# Patient Record
Sex: Female | Born: 1950 | Race: White | Hispanic: No | Marital: Married | Smoking: Never smoker
Health system: Southern US, Community
[De-identification: ages and names within clinical notes are randomized; demographics above are authoritative.]

## PROBLEM LIST (undated history)

## (undated) DIAGNOSIS — I456 Pre-excitation syndrome: Secondary | ICD-10-CM

## (undated) DIAGNOSIS — M7751 Other enthesopathy of right foot: Secondary | ICD-10-CM

## (undated) DIAGNOSIS — I471 Supraventricular tachycardia, unspecified: Secondary | ICD-10-CM

## (undated) DIAGNOSIS — M5030 Other cervical disc degeneration, unspecified cervical region: Secondary | ICD-10-CM

## (undated) DIAGNOSIS — Z9289 Personal history of other medical treatment: Secondary | ICD-10-CM

## (undated) DIAGNOSIS — B019 Varicella without complication: Secondary | ICD-10-CM

## (undated) DIAGNOSIS — I341 Nonrheumatic mitral (valve) prolapse: Secondary | ICD-10-CM

## (undated) DIAGNOSIS — N281 Cyst of kidney, acquired: Secondary | ICD-10-CM

## (undated) DIAGNOSIS — R002 Palpitations: Secondary | ICD-10-CM

## (undated) DIAGNOSIS — M8580 Other specified disorders of bone density and structure, unspecified site: Secondary | ICD-10-CM

## (undated) DIAGNOSIS — K219 Gastro-esophageal reflux disease without esophagitis: Secondary | ICD-10-CM

## (undated) DIAGNOSIS — M419 Scoliosis, unspecified: Secondary | ICD-10-CM

## (undated) HISTORY — DX: Other enthesopathy of right foot and ankle: M77.51

## (undated) HISTORY — DX: Supraventricular tachycardia, unspecified: I47.10

## (undated) HISTORY — DX: Other cervical disc degeneration, unspecified cervical region: M50.30

## (undated) HISTORY — DX: Nonrheumatic mitral (valve) prolapse: I34.1

## (undated) HISTORY — DX: Supraventricular tachycardia: I47.1

## (undated) HISTORY — DX: Pre-excitation syndrome: I45.6

## (undated) HISTORY — DX: Other specified disorders of bone density and structure, unspecified site: M85.80

## (undated) HISTORY — DX: Palpitations: R00.2

## (undated) HISTORY — DX: Gastro-esophageal reflux disease without esophagitis: K21.9

## (undated) HISTORY — DX: Cyst of kidney, acquired: N28.1

## (undated) HISTORY — DX: Varicella without complication: B01.9

## (undated) HISTORY — DX: Personal history of other medical treatment: Z92.89

## (undated) HISTORY — DX: Scoliosis, unspecified: M41.9

## (undated) LAB — CBC AND DIFFERENTIAL: WBC: 8.2 (ref 5.0–15.0)

## (undated) LAB — CBC: RBC: 4.76 (ref 3.87–5.11)

---

## 1987-11-09 HISTORY — PX: VAGINAL HYSTERECTOMY: SUR661

## 2000-02-08 ENCOUNTER — Other Ambulatory Visit: Admission: RE | Admit: 2000-02-08 | Discharge: 2000-02-08 | Payer: Self-pay | Admitting: Gynecology

## 2006-11-03 ENCOUNTER — Ambulatory Visit: Payer: Self-pay | Admitting: Internal Medicine

## 2008-07-12 ENCOUNTER — Emergency Department (HOSPITAL_COMMUNITY): Admission: EM | Admit: 2008-07-12 | Discharge: 2008-07-12 | Payer: Self-pay | Admitting: Emergency Medicine

## 2009-06-10 ENCOUNTER — Encounter: Admission: RE | Admit: 2009-06-10 | Discharge: 2009-06-10 | Payer: Self-pay | Admitting: Family Medicine

## 2010-11-30 ENCOUNTER — Encounter: Payer: Self-pay | Admitting: Family Medicine

## 2011-01-04 ENCOUNTER — Encounter (INDEPENDENT_AMBULATORY_CARE_PROVIDER_SITE_OTHER): Payer: Self-pay | Admitting: *Deleted

## 2011-01-04 ENCOUNTER — Encounter: Payer: Self-pay | Admitting: Internal Medicine

## 2011-01-07 LAB — HM COLONOSCOPY

## 2011-01-12 ENCOUNTER — Other Ambulatory Visit: Payer: Self-pay | Admitting: Internal Medicine

## 2011-01-12 ENCOUNTER — Other Ambulatory Visit (AMBULATORY_SURGERY_CENTER): Payer: BC Managed Care – PPO | Admitting: Internal Medicine

## 2011-01-12 DIAGNOSIS — D126 Benign neoplasm of colon, unspecified: Secondary | ICD-10-CM

## 2011-01-12 DIAGNOSIS — Z1211 Encounter for screening for malignant neoplasm of colon: Secondary | ICD-10-CM

## 2011-01-12 DIAGNOSIS — K5289 Other specified noninfective gastroenteritis and colitis: Secondary | ICD-10-CM

## 2011-01-12 HISTORY — PX: COLONOSCOPY: SHX174

## 2011-01-14 NOTE — Letter (Signed)
Summary: [facility] Instructions  [facility]  [address] [place], [place] [address]   Phone: [phone]  Fax: [phone]       Jenny Hicks    Mar 30, 1951    MRN: [id number]        Procedure Day /Date:  Tuesday 01/12/2011     Arrival Time: 8:30 am     Procedure Time: 9:30 am     Location of Procedure:                    _x _  [facility] (4th Floor)                        PREPARATION FOR COLONOSCOPY WITH MOVIPREP   Starting 5 days prior to your procedure Thursday 3/1 do not eat nuts, seeds, popcorn, corn, beans, peas,  salads, or any raw vegetables.  Do not take any fiber supplements (e.g. Metamucil, Citrucel, and Benefiber).  THE DAY BEFORE YOUR PROCEDURE         DATE: Monday 3/5  1.  Drink clear liquids the entire day-NO SOLID FOOD  2.  Do not drink anything colored red or purple.  Avoid juices with pulp.  No orange juice.  3.  Drink at least 64 oz. (8 glasses) of fluid/clear liquids during the day to prevent dehydration and help the prep work efficiently.  CLEAR LIQUIDS INCLUDE: Water Jello Ice Popsicles Tea (sugar ok, no milk/cream) Powdered fruit flavored drinks Coffee (sugar ok, no milk/cream) Gatorade Juice: apple, white grape, white cranberry  Lemonade Clear bullion, consomm, broth Carbonated beverages (any kind) Strained chicken noodle soup Hard Candy                             4.  In the morning, mix first dose of MoviPrep solution:    Empty 1 Pouch A and 1 Pouch B into the disposable container    Add lukewarm drinking water to the top line of the container. Mix to dissolve    Refrigerate (mixed solution should be used within 24 hrs)  5.  Begin drinking the prep at 5:00 p.m. The MoviPrep container is divided by 4 marks.   Every 15 minutes drink the solution down to the next mark (approximately 8 oz) until the full liter is complete.   6.  Follow completed prep with 16 oz of clear liquid of your choice (Nothing red  or purple).  Continue to drink clear liquids until bedtime.  7.  Before going to bed, mix second dose of MoviPrep solution:    Empty 1 Pouch A and 1 Pouch B into the disposable container    Add lukewarm drinking water to the top line of the container. Mix to dissolve    Refrigerate  THE DAY OF YOUR PROCEDURE      DATE: Tuesday 3/6  Beginning at 4:30 a.m. (5 hours before procedure):         1. Every 15 minutes, drink the solution down to the next mark (approx 8 oz) until the full liter is complete.  2. Follow completed prep with 16 oz. of clear liquid of your choice.    3. You may drink clear liquids until 7:30 am (2 HOURS BEFORE PROCEDURE).   MEDICATION INSTRUCTIONS  Unless otherwise instructed, you should take regular prescription medications with a small sip of water   as early as possible the morning of your  procedure.           OTHER INSTRUCTIONS  You will need a responsible adult at least 60 years of age to accompany you and drive you home.   This person must remain in the waiting room during your procedure.  Wear loose fitting clothing that is easily removed.  Leave jewelry and other valuables at home.  However, you may wish to bring a book to read or  an iPod/MP3 player to listen to music as you wait for your procedure to start.  Remove all body piercing jewelry and leave at home.  Total time from sign-in until discharge is approximately 2-3 hours.  You should go home directly after your procedure and rest.  You can resume normal activities the  day after your procedure.  The day of your procedure you should not:   Drive   Make legal decisions   Operate machinery   Drink alcohol   Return to work  You will receive specific instructions about eating, activities and medications before you leave.    The above instructions have been reviewed and explained to me by  Ezra Sites RN  January 04, 2011 11:37 AM     I fully understand and can  verbalize these instructions _____________________________ Date _________

## 2011-01-14 NOTE — Miscellaneous (Signed)
Summary: [facility]  Clinical Lists Changes  Medications: Added new medication of MOVIPREP 100 GM  SOLR (PEG-KCL-NACL-NASULF-NA ASC-C) As per prep instructions. - Signed Rx of MOVIPREP 100 GM  SOLR (PEG-KCL-NACL-NASULF-NA ASC-C) As per prep instructions.;  #1 x 0;  Signed;  Entered by: Ezra Sites RN;  Authorized by: Hart Carwin MD;  Method used: Electronically to [facility] [address] [place], [place], [place]  [address], Ph: [phone], Fax: [phone] Observations: Added new observation of NKA: T (01/04/2011 11:08)    Prescriptions: MOVIPREP 100 GM  SOLR (PEG-KCL-NACL-NASULF-NA ASC-C) As per prep instructions.  #1 x 0   Entered by:   Ezra Sites RN   Authorized by:   Hart Carwin MD   Signed by:   Ezra Sites RN on 01/04/2011   Method used:   Electronically to        [facility] (retail)       [address]       [place], [place]  [address]       Ph: [phone]       Fax: [phone]   RxID:   [id number]

## 2011-01-18 ENCOUNTER — Encounter: Payer: Self-pay | Admitting: Internal Medicine

## 2011-01-19 NOTE — Procedures (Addendum)
Summary: Colonoscopy  Patient: [name] Note: All result statuses are Final unless otherwise noted.  Tests: (1) Colonoscopy (COL)   COL Colonoscopy           DONE     [facility]     [address]     [place], [place]  [address]          COLONOSCOPY PROCEDURE REPORT          PATIENT:  [name], Jenny Hicks  MR#:  [id number]     BIRTHDATE:  18-Apr-1951, 59 yrs. old  GENDER:  female     ENDOSCOPIST:  Hedwig Morton. Juanda Chance, MD     REF. BY:  Nolon Nations, M.D.     PROCEDURE DATE:  01/12/2011     PROCEDURE:  Colonoscopy 19[address]     ASA CLASS:  Class I     INDICATIONS:  Routine Risk Screening     MEDICATIONS:   Versed 7 mg, Fentanyl 75 mcg          DESCRIPTION OF PROCEDURE:   After the risks benefits and     alternatives of the procedure were thoroughly explained, informed     consent was obtained.  Digital rectal exam was performed and     revealed no rectal masses.   The LB [id number]-[id number] endoscope     was introduced through the anus and advanced to the cecum, which     was identified by both the appendix and ileocecal valve, without     limitations.  The quality of the prep was good, using MiraLax.     The instrument was then slowly withdrawn as the colon was fully     examined.     <<PROCEDUREIMAGES>>          FINDINGS:  A pedunculated polyp was found. 1 cm friable polyp at     55 cm Polyp was snared, then cauterized with monopolar cautery.     Retrieval was successful (see image4, image5, and image6). snare     polyp  This was otherwise a normal examination of the colon (see     image7, image3, image2, and image1).   Retroflexed views in the     rectum revealed no abnormalities.    The scope was then withdrawn     from the patient and the procedure completed.          COMPLICATIONS:  None     ENDOSCOPIC IMPRESSION:     1) Pedunculated polyp     2) Otherwise normal examination     s/p snare polypectomy     RECOMMENDATIONS:     1) Await pathology results     2)  High fiber diet.     REPEAT EXAM:  In 5 year(s) for.          ______________________________     Hedwig Morton. Juanda Chance, MD          CC:          n.     eSIGNED:   Hedwig Morton. Revin Corker at 01/12/2011 10:17 AM          Irene Pap, [id number]  Note: An exclamation mark (!) indicates a result that was not dispersed into the flowsheet. Document Creation Date: 01/12/2011 10:18 AM _______________________________________________________________________  (1) Order result status: Final Collection or observation date-time: 01/12/2011 10:09 Requested date-time:  Receipt date-time:  Reported date-time:  Referring Physician:   Ordering Physician: Lina Sar [phone]) Specimen Source:  Source: Launa Grill Order  Number: [id number] Lab site:   Appended Document: Colonoscopy     Procedures Next Due Date:    Colonoscopy: 01/2021

## 2011-01-26 NOTE — Letter (Signed)
Summary: Patient Notice- Polyp Results  [facility]  [address] [place], [place] [address]   Phone: [phone]  Fax: [phone]        January 18, 2011 MRN: [id number]    Jenny Hicks [address] [place], [place]  [address]    Dear Ms. Velazco,  I am pleased to inform you that the colon polyp(s) removed during your recent colonoscopy was (were) found to be benign (no cancer detected) upon pathologic examination.Thepolyp consisted of inflamed tissue ( not premalignant)  I recommend you have a repeat colonoscopy examination in 10 _ years to look for recurrent polyps, as having colon polyps increases your risk for having recurrent polyps or even colon cancer in the future.  Should you develop new or worsening symptoms of abdominal pain, bowel habit changes or bleeding from the rectum or bowels, please schedule an evaluation with either your primary care physician or with me.  Additional information/recommendations:  _x_ No further action with gastroenterology is needed at this time. Please      follow-up with your primary care physician for your other healthcare      needs.  __ Please call [phone] to schedule a return visit to review your      situation.  __ Please keep your follow-up visit as already scheduled.  __ Continue treatment plan as outlined the day of your exam.  Please call us if you are having persistent problems or have questions about your condition that have not been fully answered at this time.  Sincerely,  Hart Carwin MD  This letter has been electronically signed by your physician.  Appended Document: Patient Notice- Polyp Results letter mailed

## 2011-08-11 LAB — POCT I-STAT, CHEM 8
Glucose, Bld: 107 — ABNORMAL HIGH
HCT: 41
Hemoglobin: 13.9
Potassium: 3.5
Sodium: 140
TCO2: 28

## 2011-08-11 LAB — COMPREHENSIVE METABOLIC PANEL
ALT: 22
AST: 23
Calcium: 9
Creatinine, Ser: 0.73
GFR calc Af Amer: >60
Sodium: 142
Total Protein: 6.2

## 2011-08-11 LAB — POCT CARDIAC MARKERS
CKMB, poc: <1 — ABNORMAL LOW
CKMB, poc: <1 — ABNORMAL LOW
Myoglobin, poc: 36.8
Troponin i, poc: <0.05

## 2011-08-11 LAB — APTT: aPTT: 29

## 2013-03-08 DIAGNOSIS — Z9289 Personal history of other medical treatment: Secondary | ICD-10-CM

## 2013-03-08 HISTORY — DX: Personal history of other medical treatment: Z92.89

## 2013-03-08 HISTORY — PX: TRANSTHORACIC ECHOCARDIOGRAM: SHX275

## 2013-03-13 ENCOUNTER — Other Ambulatory Visit (HOSPITAL_COMMUNITY): Payer: Self-pay | Admitting: Cardiovascular Disease

## 2013-03-13 DIAGNOSIS — I341 Nonrheumatic mitral (valve) prolapse: Secondary | ICD-10-CM

## 2013-03-13 DIAGNOSIS — R079 Chest pain, unspecified: Secondary | ICD-10-CM

## 2013-03-23 ENCOUNTER — Ambulatory Visit (HOSPITAL_COMMUNITY)
Admission: RE | Admit: 2013-03-23 | Discharge: 2013-03-23 | Disposition: A | Discharge status: Home / Self Care | Payer: BC Managed Care – PPO | Source: Ambulatory Visit | Attending: Cardiovascular Disease | Admitting: Cardiovascular Disease

## 2013-03-23 DIAGNOSIS — R079 Chest pain, unspecified: Secondary | ICD-10-CM | POA: Insufficient documentation

## 2013-03-23 DIAGNOSIS — R002 Palpitations: Secondary | ICD-10-CM | POA: Insufficient documentation

## 2013-03-23 DIAGNOSIS — I341 Nonrheumatic mitral (valve) prolapse: Secondary | ICD-10-CM

## 2013-03-23 DIAGNOSIS — I517 Cardiomegaly: Secondary | ICD-10-CM | POA: Insufficient documentation

## 2013-03-23 DIAGNOSIS — R0609 Other forms of dyspnea: Secondary | ICD-10-CM | POA: Insufficient documentation

## 2013-03-23 DIAGNOSIS — E663 Overweight: Secondary | ICD-10-CM | POA: Insufficient documentation

## 2013-03-23 DIAGNOSIS — I079 Rheumatic tricuspid valve disease, unspecified: Secondary | ICD-10-CM | POA: Insufficient documentation

## 2013-03-23 DIAGNOSIS — R42 Dizziness and giddiness: Secondary | ICD-10-CM | POA: Insufficient documentation

## 2013-03-23 DIAGNOSIS — R0602 Shortness of breath: Secondary | ICD-10-CM | POA: Insufficient documentation

## 2013-03-23 DIAGNOSIS — Z8249 Family history of ischemic heart disease and other diseases of the circulatory system: Secondary | ICD-10-CM | POA: Insufficient documentation

## 2013-03-23 DIAGNOSIS — R0989 Other specified symptoms and signs involving the circulatory and respiratory systems: Secondary | ICD-10-CM | POA: Insufficient documentation

## 2013-03-23 DIAGNOSIS — I059 Rheumatic mitral valve disease, unspecified: Secondary | ICD-10-CM | POA: Insufficient documentation

## 2013-03-23 MED ORDER — TECHNETIUM TC 99M SESTAMIBI GENERIC - CARDIOLITE
10.4000 | Freq: Once | INTRAVENOUS | Status: AC | PRN
  Administered 2013-03-23: 10 via INTRAVENOUS

## 2013-03-23 MED ORDER — TECHNETIUM TC 99M SESTAMIBI GENERIC - CARDIOLITE
30.2000 | Freq: Once | INTRAVENOUS | Status: AC | PRN
  Administered 2013-03-23: 30.2 via INTRAVENOUS

## 2013-03-23 NOTE — Progress Notes (Signed)
[facility]   2D echo completed 03/23/2013.   Cindy Mariany Mackintosh, RDCS  

## 2013-03-23 NOTE — Procedures (Addendum)
[facility] [address] [place] [address] [phone]  Cardiology Nuclear Med Study  Jenny Hicks is a 62 y.o. female     MRN : [id number]     DOB: Jul 10, 1951  Procedure Date: 03/23/2013  Nuclear Med Background Indication for Stress Test:  Evaluation for Ischemia History:  MVP Cardiac Risk Factors: Family History - CAD, History of Smoking and Overweight  Symptoms:  Chest Pain, Dizziness, DOE, Fatigue, Light-Headedness, Palpitations and SOB   Nuclear Pre-Procedure Caffeine/Decaff Intake:  10:00pm NPO After: 8:00am   IV Site: R Antecubital  IV 0.9% NS with Angio Cath:  22g  Chest Size (in):  n/a IV Started by: Jenny Pomfret, RN  Height: 5\' 4"  (1.626 m)  Cup Size: DD  BMI:  Body mass index is 26.94 kg/(m^2). Weight:  157 lb (71.215 kg)   Tech Comments:  N/A    Nuclear Med Study 1 or 2 day study: 1 day  Stress Test Type:  Stress  Order Authorizing Provider:  Susa Griffins, MD   Resting Radionuclide: Technetium 36m Sestamibi  Resting Radionuclide Dose: 10.4 mCi   Stress Radionuclide:  Technetium 36m Sestamibi  Stress Radionuclide Dose: 30.2 mCi           Stress Protocol Rest HR: 81 Stress HR: 222  Rest BP: 128/93 Stress BP: 165/113  Exercise Time (min): 9:00 METS: 10.1   Predicted Max HR: 159 bpm % Max HR: 139.62 bpm Rate Pressure Product: 86578  Dose of Adenosine (mg):  n/a Dose of Lexiscan: n/a mg  Dose of Atropine (mg): n/a Dose of Dobutamine: n/a mcg/kg/min (at max HR)  Stress Test Technologist: Jenny Hicks, CCT Nuclear Technologist: Jenny Hicks, CNMT   Rest Procedure:  Myocardial perfusion imaging was performed at rest 45 minutes following the intravenous administration of Technetium 25m Sestamibi. Stress Procedure:  The patient performed treadmill exercise using a Bruce  Protocol for 9:00 minutes. The patient stopped due to SOB and denied any chest pain.  There were no significant ST-T wave  changes.  Technetium 56m Sestamibi was injected at peak exercise and myocardial perfusion imaging was performed after a brief delay.  Transient Ischemic Dilatation (Normal <1.22):  0.40 Lung/Heart Ratio (Normal <0.45):  0.30 QGS EDV:  51 ml QGS ESV:  9 ml LV Ejection Fraction: 82%  Signed by    Rest ECG: NSR - Normal EKG  Stress ECG: Pt went into an SVT at a rate of 200 with 1mm of ST depression.  QPS Raw Data Images:  Normal; no motion artifact; normal heart/lung ratio. Stress Images:  Normal homogeneous uptake in all areas of the myocardium. Rest Images:  Normal homogeneous uptake in all areas of the myocardium. Subtraction (SDS):  No evidence of ischemia.  Impression Exercise Capacity:  Good exercise capacity. BP Response:  Normal blood pressure response. Clinical Symptoms:  No significant symptoms noted. ECG Impression:  + for ischemia during the episode of SVT Comparison with Prior Nuclear Study: No previous nuclear study performed  Overall Impression:  Normal stress nuclear study.  LV Wall Motion:  NL LV Function; NL Wall Motion   Jenny Gess, MD  03/23/2013 3:09 PM

## 2013-03-26 ENCOUNTER — Other Ambulatory Visit: Payer: Self-pay | Admitting: Cardiovascular Disease

## 2013-03-26 LAB — COMPREHENSIVE METABOLIC PANEL
ALT: 16 U/L (ref 0–35)
BUN: 18 mg/dL (ref 6–23)
CO2: 31 mEq/L (ref 19–32)
Calcium: 9.7 mg/dL (ref 8.4–10.5)
Chloride: 102 mEq/L (ref 96–112)
Creat: 0.69 mg/dL (ref 0.50–1.10)
Total Bilirubin: 0.6 mg/dL (ref 0.3–1.2)

## 2013-03-27 ENCOUNTER — Encounter: Payer: Self-pay | Admitting: Cardiovascular Disease

## 2013-08-21 ENCOUNTER — Encounter: Payer: Self-pay | Admitting: *Deleted

## 2013-08-23 ENCOUNTER — Encounter: Payer: Self-pay | Admitting: Internal Medicine

## 2013-08-23 ENCOUNTER — Ambulatory Visit (INDEPENDENT_AMBULATORY_CARE_PROVIDER_SITE_OTHER): Payer: BC Managed Care – PPO | Admitting: Internal Medicine

## 2013-08-23 VITALS — BP 124/84 | HR 67 | Ht 64.0 in | Wt 156.6 lb

## 2013-08-23 DIAGNOSIS — I456 Pre-excitation syndrome: Secondary | ICD-10-CM | POA: Insufficient documentation

## 2013-08-23 DIAGNOSIS — I341 Nonrheumatic mitral (valve) prolapse: Secondary | ICD-10-CM

## 2013-08-23 DIAGNOSIS — I340 Nonrheumatic mitral (valve) insufficiency: Secondary | ICD-10-CM

## 2013-08-23 DIAGNOSIS — I059 Rheumatic mitral valve disease, unspecified: Secondary | ICD-10-CM

## 2013-08-23 NOTE — Patient Instructions (Signed)
For you allergies - try Zyrtec OTC (generic is cetirizine) - [facility] or [facility] has these available for purchase at reasonable prices.   Your physician wants you to follow-up in: 1 year. You will receive a reminder letter in the mail two months in advance. If you don't receive a letter, please call our office to schedule the follow-up appointment.

## 2013-08-23 NOTE — Progress Notes (Signed)
OFFICE NOTE  Chief Complaint:  Feels well, no complaints  Primary Care Physician: Garth Schlatter, MD  HPI:  Jenny Hicks pleasant 62 year old female with a history of palpitations in the past. She was noted at one point to have a short PR interval on her EKG. She underwent an echocardiogram in May of 2014 which showed normal systolic function EF of 55-60%. She also has mild mitral valve prolapse and mild to moderate mitral regurgitation.  She has had a stress test on 03/23/2013. During that stress test which was a Bruce treadmill stress test she went into an SVT with a heart rate of 200, and an associated 1 mm of ST depression. She received 6 mg of adenosine and carotid sinus massage which broke the arrhythmia.  She was intermittently taking her Toprol at that time. She was then started on Toprol-XL 25 mg, however she is felt that this is kept her somewhat fatigued. Since then she's decreased her dose to 12.5 mg daily and is tolerating that well taking it regularly. She's had no further recurrence of SVT or palpitations. She does get regular checkups and has no other medical problems except for GERD for which she take Prilosec over-the-counter.  PMHx:  Past Medical History  Diagnosis Date  . History of nuclear stress test 03/2013    stress myoview; normal study; SVT (HR 200) - adenosine  . GERD (gastroesophageal reflux disease)   . Heart palpitations     Past Surgical History  Procedure Laterality Date  . Transthoracic echocardiogram  03/2013    EF 55-60%, mild conc hypertrophy; mild MVP, mild-mod MR    FAMHx:  Family History  Problem Relation Age of Onset  . Heart attack Mother   . Hypertension Mother   . Stroke Mother   . Myasthenia gravis Father     SOCHx:   reports that she has quit smoking. She has never used smokeless tobacco. She reports that she does not drink alcohol or use illicit drugs.  ALLERGIES:  No Known Allergies  ROS: A comprehensive review of  systems was negative.  HOME MEDS: Current Outpatient Prescriptions  Medication Sig Dispense Refill  . CALCIUM PO Take by mouth daily.      . metoprolol succinate (TOPROL-XL) 25 MG 24 hr tablet Take 12.5 mg by mouth daily.       . Multiple Vitamins-Minerals (CENTRUM PO) Take by mouth daily.      Marland Kitchen omeprazole (PRILOSEC OTC) 20 MG tablet Take 20 mg by mouth daily as needed.       No current facility-administered medications for this visit.    LABS/IMAGING: No results found for this or any previous visit (from the past 48 hour(s)). No results found.  VITALS: BP 124/84  Pulse 67  Ht 5\' 4"  (1.626 m)  Wt 156 lb 9.6 oz (71.033 kg)  BMI 26.87 kg/m2  EXAM: General appearance: alert and no distress Neck: no carotid bruit and no JVD Lungs: clear to auscultation bilaterally Heart: regular rate and rhythm, S1, S2 normal, 2/6 systolic murmur at apex with mid-systolic click, no rub or gallop noted Abdomen: soft, non-tender; bowel sounds normal; no masses,  no organomegaly Extremities: extremities normal, atraumatic, no cyanosis or edema Pulses: 2+ and symmetric Skin: Skin color, texture, turgor normal. 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 GERD - controlled  PLAN: 1.   Ms. Sawatzky has an LGL and dry with palpitations and documented paroxysmal tachycardia arrhythmias. This has been suppressed with low dose beta blocker and she is asymptomatic. She also has mild reflux which is well controlled on Prilosec. She is active and generally leads a fairly healthy lifestyle. We'll plan to continue her current medications and advised her that she could take additional metoprolol as needed if she has a breakthrough palpitation episode. She did inquire about taking allergy medicines for seasonal allergies, and I advised  that it would be okay to take an antihistamine as long as it doesn't contain a decongestant Sudafed.  Plan for followup in one year.  Chrystie Nose, MD, [facility] Attending Cardiologist [facility]  Donnelle Olmeda C 08/23/2013, 9:25 AM

## 2013-09-13 ENCOUNTER — Other Ambulatory Visit: Payer: Self-pay

## 2014-08-06 HISTORY — PX: DIAGNOSTIC MAMMOGRAM: HXRAD719

## 2014-10-11 ENCOUNTER — Encounter: Payer: Self-pay | Admitting: Internal Medicine

## 2014-10-11 ENCOUNTER — Ambulatory Visit (INDEPENDENT_AMBULATORY_CARE_PROVIDER_SITE_OTHER): Payer: BC Managed Care – PPO | Admitting: Internal Medicine

## 2014-10-11 VITALS — BP 120/88 | HR 64 | Ht 64.0 in | Wt 159.7 lb

## 2014-10-11 DIAGNOSIS — I340 Nonrheumatic mitral (valve) insufficiency: Secondary | ICD-10-CM

## 2014-10-11 DIAGNOSIS — I341 Nonrheumatic mitral (valve) prolapse: Secondary | ICD-10-CM

## 2014-10-11 DIAGNOSIS — I456 Pre-excitation syndrome: Secondary | ICD-10-CM

## 2014-10-11 MED ORDER — METOPROLOL SUCCINATE ER 25 MG PO TB24: 12.5000 mg | ORAL_TABLET | Freq: Every day | ORAL | Status: DC

## 2014-10-11 NOTE — Patient Instructions (Signed)
Your physician wants you to follow-up in: 1 year with Dr. Hilty. You will receive a reminder letter in the mail two months in advance. If you don't receive a letter, please call our office to schedule the follow-up appointment.  

## 2014-10-11 NOTE — Progress Notes (Signed)
OFFICE NOTE  Chief Complaint:  Feels well, no complaints  Primary Care Physician: Daphene Calamity, MD  HPI:  Jenny Hicks pleasant 63 year old female with a history of palpitations in the past. She was noted at one point to have a short PR interval on her EKG. She underwent an echocardiogram in May of 2014 which showed normal systolic function EF of 60-63%. She also has mild mitral valve prolapse and mild to moderate mitral regurgitation.  She has had a stress test on 03/23/2013. During that stress test which was a Bruce treadmill stress test she went into an SVT with a heart rate of 200, and an associated 1 mm of ST depression. She received 6 mg of adenosine and carotid sinus massage which broke the arrhythmia.  She was intermittently taking her Toprol at that time. She was then started on Toprol-XL 25 mg, however she is felt that this is kept her somewhat fatigued. Since then she's decreased her dose to 12.5 mg daily and is tolerating that well taking it regularly. She's had no further recurrence of SVT or palpitations. She does get regular checkups and has no other medical problems except for GERD for which she take Prilosec over-the-counter.  Mrs. Herringshaw returns today for follow-up. She is doing very well. She's had no recurrent palpitations on low-dose Toprol.  PMHx:  Past Medical History  Diagnosis Date  . History of nuclear stress test 03/2013    stress myoview; normal study; SVT (HR 200) - adenosine  . GERD (gastroesophageal reflux disease)   . Heart palpitations     Past Surgical History  Procedure Laterality Date  . Transthoracic echocardiogram  03/2013    EF 55-60%, mild conc hypertrophy; mild MVP, mild-mod MR  . Diagnostic mammogram  08/06/2014    digital  . Colonoscopy  01/12/2011    FAMHx:  Family History  Problem Relation Age of Onset  . Heart attack Mother     cervical & femur fractures  . Hypertension Mother     Alzheimer's  . Stroke Mother     Afib    . Myasthenia gravis Father     SOCHx:   reports that she has quit smoking. She has never used smokeless tobacco. She reports that she does not drink alcohol or use illicit drugs.  ALLERGIES:  No Known Allergies  ROS: A comprehensive review of systems was negative.  HOME MEDS: Current Outpatient Prescriptions  Medication Sig Dispense Refill  . CALCIUM PO Take by mouth daily.    . metoprolol succinate (TOPROL-XL) 25 MG 24 hr tablet Take 0.5 tablets (12.5 mg total) by mouth daily. 45 tablet 3  . Multiple Vitamins-Minerals (CENTRUM PO) Take by mouth daily.     No current facility-administered medications for this visit.    LABS/IMAGING: No results found for this or any previous visit (from the past 48 hour(s)). No results found.  VITALS: BP 120/88 mmHg  Pulse 64  Ht 5\' 4"  (1.626 m)  Wt 159 lb 11.2 oz (72.439 kg)  BMI 27.40 kg/m2  EXAM: General appearance: alert and no distress Neck: no carotid bruit and no JVD Lungs: clear to auscultation bilaterally Heart: regular rate and rhythm, S1, S2 normal, 2/6 systolic murmur at apex with mid-systolic click, no rub or gallop noted Abdomen: soft, non-tender; bowel sounds normal; no masses,  no organomegaly Extremities: extremities normal, atraumatic, no cyanosis or edema Pulses: 2+ and symmetric Skin: Skin color, texture, turgor normal. 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 GERD - controlled  PLAN: 1.   Ms. Tarkowski has an LGL and dry with palpitations and documented paroxysmal tachycardia arrhythmias. This has been suppressed with low dose beta blocker and she is asymptomatic. Overall she is doing well and we will go ahead and refill her medications today. Plan follow-up annually or sooner as necessary.  Pixie Casino, MD, [facility] Attending  Cardiologist [facility]  HILTY,Kenneth C 10/11/2014, 10:40 AM

## 2014-11-14 ENCOUNTER — Ambulatory Visit: Payer: BC Managed Care – PPO | Admitting: Internal Medicine

## 2015-04-24 ENCOUNTER — Encounter: Payer: Self-pay | Admitting: Family Medicine

## 2015-09-01 ENCOUNTER — Other Ambulatory Visit: Payer: Self-pay | Admitting: Plastic Surgery

## 2015-10-06 ENCOUNTER — Telehealth: Payer: Self-pay | Admitting: Internal Medicine

## 2015-10-06 NOTE — Telephone Encounter (Signed)
Jenny Hicks is calling to see if Dr. Debara Pickett will give her something for her nerves, she will be traveling [place] . Please call .  Thanks

## 2015-10-06 NOTE — Telephone Encounter (Signed)
Spoke to patient, advised to contact PCP for anxiolytic. She voiced understanding.

## 2015-10-08 ENCOUNTER — Ambulatory Visit: Payer: Self-pay | Admitting: Internal Medicine

## 2015-10-08 ENCOUNTER — Ambulatory Visit: Payer: 59 | Admitting: Internal Medicine

## 2015-11-09 DIAGNOSIS — M8580 Other specified disorders of bone density and structure, unspecified site: Secondary | ICD-10-CM

## 2015-11-09 HISTORY — DX: Other specified disorders of bone density and structure, unspecified site: M85.80

## 2015-11-19 ENCOUNTER — Encounter: Payer: Self-pay | Admitting: Internal Medicine

## 2015-11-19 ENCOUNTER — Ambulatory Visit (INDEPENDENT_AMBULATORY_CARE_PROVIDER_SITE_OTHER): Payer: BLUE CROSS/BLUE SHIELD | Admitting: Internal Medicine

## 2015-11-19 VITALS — BP 116/82 | HR 74 | Ht 64.0 in | Wt 159.7 lb

## 2015-11-19 DIAGNOSIS — I341 Nonrheumatic mitral (valve) prolapse: Secondary | ICD-10-CM

## 2015-11-19 DIAGNOSIS — I340 Nonrheumatic mitral (valve) insufficiency: Secondary | ICD-10-CM | POA: Diagnosis not present

## 2015-11-19 DIAGNOSIS — I456 Pre-excitation syndrome: Secondary | ICD-10-CM | POA: Diagnosis not present

## 2015-11-19 DIAGNOSIS — I471 Supraventricular tachycardia: Secondary | ICD-10-CM | POA: Diagnosis not present

## 2015-11-19 MED ORDER — METOPROLOL SUCCINATE ER 25 MG PO TB24: 12.5000 mg | ORAL_TABLET | Freq: Every day | ORAL | Status: DC

## 2015-11-19 NOTE — Progress Notes (Signed)
OFFICE NOTE  Chief Complaint:  Feels well, no complaints  Primary Care Physician: Daphene Calamity, MD  HPI:  Jenny Hicks pleasant 65 year old female with a history of palpitations in the past. She was noted at one point to have a short PR interval on her EKG. She underwent an echocardiogram in May of 2014 which showed normal systolic function EF of 0000000. She also has mild mitral valve prolapse and mild to moderate mitral regurgitation.  She has had a stress test on 03/23/2013. During that stress test which was a Bruce treadmill stress test she went into an SVT with a heart rate of 200, and an associated 1 mm of ST depression. She received 6 mg of adenosine and carotid sinus massage which broke the arrhythmia.  She was intermittently taking her Toprol at that time. She was then started on Toprol-XL 25 mg, however she is felt that this is kept her somewhat fatigued. Since then she's decreased her dose to 12.5 mg daily and is tolerating that well taking it regularly. She's had no further recurrence of SVT or palpitations. She does get regular checkups and has no other medical problems except for GERD for which she take Prilosec over-the-counter.  Jenny Hicks returns today for follow-up. She is taken herself off of beta blocker. She reports she's had no further palpitations over the past year. 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 She reports one of her daughters got married this summer. Overall she seems to be doing very well without any recurrent palpitations.  PMHx:  Past Medical History  Diagnosis Date  . History of nuclear stress test 03/2013    stress myoview; normal study; SVT (HR 200) - adenosine  . GERD (gastroesophageal reflux disease)   . Heart palpitations     Past Surgical History  Procedure Laterality Date  . Transthoracic echocardiogram  03/2013    EF 55-60%, mild conc hypertrophy; mild MVP, mild-mod MR  .  Diagnostic mammogram  08/06/2014    digital  . Colonoscopy  01/12/2011    FAMHx:  Family History  Problem Relation Age of Onset  . Heart attack Mother     cervical & femur fractures  . Hypertension Mother     Alzheimer's  . Stroke Mother     Afib  . Myasthenia gravis Father     SOCHx:   reports that she has quit smoking. She has never used smokeless tobacco. She reports that she does not drink alcohol or use illicit drugs.  ALLERGIES:  No Known Allergies  ROS: A comprehensive review of systems was negative.  HOME MEDS: Current Outpatient Prescriptions  Medication Sig Dispense Refill  . CALCIUM PO Take by mouth daily.    . Multiple Vitamins-Minerals (CENTRUM PO) Take by mouth daily.    . metoprolol succinate (TOPROL-XL) 25 MG 24 hr tablet Take 0.5 tablets (12.5 mg total) by mouth daily. 45 tablet 0   No current facility-administered medications for this visit.    LABS/IMAGING: No results found for this or any previous visit (from the past 48 hour(s)). No results found.  VITALS: BP 116/82 mmHg  Pulse 74  Ht 5\' 4"  (1.626 m)  Wt 159 lb 11.2 oz (72.439 kg)  BMI 27.40 kg/m2  EXAM: General appearance: alert and no distress Neck: no carotid bruit and no JVD Lungs: clear to auscultation bilaterally Heart: regular rate and rhythm, S1, S2 normal, 2/6 systolic murmur at apex with mid-systolic  click, no rub or gallop noted Abdomen: soft, non-tender; bowel sounds normal; no masses,  no organomegaly Extremities: extremities normal, atraumatic, no cyanosis or edema Pulses: 2+ and symmetric Skin: Skin color, texture, turgor normal. 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   Ms. Tonn has an LGL and dry with palpitations and documented paroxysmal tachycardia arrhythmias. This has been suppressed with low dose beta blocker and she is  asymptomatic. She is taken herself off of beta blocker and I cautioned her that her symptoms could come back. So far she's done well. I will go ahead and give her prescription of metoprolol to take as needed if her symptoms were to return. She does have mild mitral valve prolapse with mitral regurgitation. It would be reasonable to consider repeat echocardiogram several years from now. I'll defer that to her primary care provider. Follow-up with me can be on an as-needed basis.  Pixie Casino, MD, [facility] Attending Cardiologist [place] Amijah Timothy 11/19/2015, 5:29 PM

## 2015-11-19 NOTE — Patient Instructions (Addendum)
Your physician recommends that you schedule a follow-up appointment in: AS NEEDED  A NEW RX FOR METOPROLOL HAS BEEN SENT TO YOUR PHARMACY.

## 2015-12-24 ENCOUNTER — Telehealth: Payer: Self-pay | Admitting: Internal Medicine

## 2015-12-24 NOTE — Telephone Encounter (Signed)
Spoke to patient  She states procedure was cancelled ,due to  Blood pressure was 160/98 ,pulse 98 Patient thinks it may have been cause by taking hydrocodone /acetam. 5/325 mg Patient states she will monitor he blood pressure and called if needed. Patient states she has not had an issue with blood pressure in the past RN informed patient to contact primary for blood pressure or could contact office ( last visit  On as needed basis) Patient verbalized understanding.Marland Kitchen

## 2015-12-24 NOTE — Telephone Encounter (Signed)
Hassan Rowan( Dr. Dessie Coma) office is calling because Mrs. Stebelton was schedule to have a procedure this morning , but her Bp is elevated and having rapid pulse. Wanted someone to call the patient . Thanks

## 2016-06-04 ENCOUNTER — Other Ambulatory Visit: Payer: Self-pay | Admitting: Obstetrics & Gynecology

## 2016-06-04 DIAGNOSIS — E2839 Other primary ovarian failure: Secondary | ICD-10-CM

## 2016-06-28 ENCOUNTER — Ambulatory Visit
Admission: RE | Admit: 2016-06-28 | Discharge: 2016-06-28 | Disposition: A | Discharge status: Home / Self Care | Payer: PPO | Source: Ambulatory Visit | Attending: Obstetrics & Gynecology | Admitting: Obstetrics & Gynecology

## 2016-06-28 DIAGNOSIS — E2839 Other primary ovarian failure: Secondary | ICD-10-CM

## 2016-06-28 DIAGNOSIS — M8589 Other specified disorders of bone density and structure, multiple sites: Secondary | ICD-10-CM | POA: Diagnosis not present

## 2016-06-28 DIAGNOSIS — Z780 Asymptomatic menopausal state: Secondary | ICD-10-CM | POA: Diagnosis not present

## 2016-07-02 ENCOUNTER — Other Ambulatory Visit: Payer: Self-pay | Admitting: Obstetrics & Gynecology

## 2016-07-02 DIAGNOSIS — Z1231 Encounter for screening mammogram for malignant neoplasm of breast: Secondary | ICD-10-CM

## 2016-07-26 ENCOUNTER — Ambulatory Visit: Payer: PPO

## 2016-07-27 ENCOUNTER — Ambulatory Visit: Payer: PPO

## 2016-07-28 ENCOUNTER — Ambulatory Visit
Admission: RE | Admit: 2016-07-28 | Discharge: 2016-07-28 | Disposition: A | Discharge status: Home / Self Care | Payer: PPO | Source: Ambulatory Visit | Attending: Obstetrics & Gynecology | Admitting: Obstetrics & Gynecology

## 2016-07-28 DIAGNOSIS — Z1231 Encounter for screening mammogram for malignant neoplasm of breast: Secondary | ICD-10-CM | POA: Diagnosis not present

## 2016-08-02 DIAGNOSIS — H52223 Regular astigmatism, bilateral: Secondary | ICD-10-CM | POA: Diagnosis not present

## 2016-09-14 ENCOUNTER — Ambulatory Visit (INDEPENDENT_AMBULATORY_CARE_PROVIDER_SITE_OTHER): Payer: PPO | Admitting: Nurse Practitioner

## 2016-09-14 ENCOUNTER — Other Ambulatory Visit: Payer: Self-pay

## 2016-09-14 ENCOUNTER — Encounter: Payer: Self-pay | Admitting: Nurse Practitioner

## 2016-09-14 VITALS — BP 137/83 | HR 66 | Ht 64.0 in | Wt 163.4 lb

## 2016-09-14 DIAGNOSIS — R079 Chest pain, unspecified: Secondary | ICD-10-CM | POA: Diagnosis not present

## 2016-09-14 DIAGNOSIS — I341 Nonrheumatic mitral (valve) prolapse: Secondary | ICD-10-CM

## 2016-09-14 NOTE — Progress Notes (Signed)
Office Visit    Patient Name: Jenny Hicks Date of Encounter: 09/14/2016  Primary Care Provider:  Daphene Calamity, MD Primary Cardiologist:  C. Hilty, MD   Chief Complaint    65 year old female with prior history of palpitations, SVT, and mitral valve prolapse who presents for follow-up related to a recent episode of chest pain.  Past Medical History    Past Medical History:  Diagnosis Date  . GERD (gastroesophageal reflux disease)   . Heart palpitations   . History of nuclear stress test 03/2013   stress myoview; normal study; SVT (HR 200) - adenosine  . Mitral valve prolapse    a. 03/2013 Echo: EF 55-60%, mild MVP, mild to mod MR.  . SVT (supraventricular tachycardia) ([place])    a. 03/2013 occurred during ex MV-->resolved with adenosine and carotid massage.   Past Surgical History:  Procedure Laterality Date  . COLONOSCOPY  01/12/2011  . DIAGNOSTIC MAMMOGRAM  08/06/2014   digital  . TRANSTHORACIC ECHOCARDIOGRAM  03/2013   EF 55-60%, mild conc hypertrophy; mild MVP, mild-mod MR    Allergies  No Known Allergies  History of Present Illness    65 year old female with a prior history of palpitations. At one point she was noted to have a short PR interval on ECG and underwent echo in May 2014 showing normal LV function. She had mild mitral valve prolapse and mild to moderate mitral regurgitation. Stress test was performed in May 2014 and during that stress test, she went into SVT with associated ST segment depression. Heart rate got to 200. She was treated with adenosine and carotid massage and subsequently broke. She had been treated with oral beta blocker therapy but over the past few years, she has weaned this down and discontinued it in 2016. Currently, she has a prescription for Toprol-XL to be taken as needed. She has not had any recurrence of SVT.  She travels quite often and in October took a cruise from [place] to [place]. She was very active on the  cruise and on one day noted left-sided chest discomfort that radiated to her back. There were no associated symptoms. This persisted throughout the day but was not present the following day or the remainder of the cruise. As noted, she remained active and once in [place], even did the [employer] climb without any difficulty. After 3 days in [place], she flew back to [place]. She has had no recurrence of symptoms since her return. She denies PND, orthopnea, dizziness, syncope, edema, or early satiety.   Home Medications    Prior to Admission medications   Medication Sig Start Date End Date Taking? Authorizing Provider  CALCIUM PO Take by mouth daily.    Historical Provider, MD  metoprolol succinate (TOPROL-XL) 25 MG 24 hr tablet Take 0.5 tablets (12.5 mg total) by mouth daily. 11/19/15   Pixie Casino, MD  Multiple Vitamins-Minerals (CENTRUM PO) Take by mouth daily.    Historical Provider, MD    Review of Systems    As above, an episode of chest discomfort that occurred while on a cruise, lasting all day, and resolving spontaneously..  All other systems reviewed and are otherwise negative except as noted above.  Physical Exam    VS:  BP 137/83   Pulse 66   Ht 5\' 4"  (1.626 m)   Wt 163 lb 6 oz (74.1 kg)   BMI 28.04 kg/m  , BMI Body mass index is 28.04 kg/m. GEN: Well nourished, well developed, in  no acute distress.  HEENT: normal.  Neck: Supple, no JVD, carotid bruits, or masses. Cardiac: RRR, no murmurs, rubs, or gallops. No clubbing, cyanosis, edema.  Radials/DP/PT 2+ and equal bilaterally.  Respiratory:  Respirations regular and unlabored, clear to auscultation bilaterally. GI: Soft, nontender, nondistended, BS + x 4. MS: no deformity or atrophy. Skin: warm and dry, no rash. Neuro:  Strength and sensation are intact. Psych: Normal affect.  Accessory Clinical Findings    ECG - Regular sinus rhythm, 66, no acute ST or T changes.  Assessment & Plan    1.  Left-sided chest  pain: Patient recently had an episode of left-sided chest pain that occurred while on vacation and lasted all day. There were no associated symptoms. She was very active on that medication and did not have any limitations in her activities. She has had no recurrence of the symptoms. Symptoms are somewhat atypical however she is concerned about development of coronary artery disease. She previously had a stress test in 2014 where she developed SVT. I will arrange for repeat exercise Myoview to rule out ischemia.  2. Mitral valve prolapse with mild to moderate mitral regurgitation: She has not been having any significant dyspnea and has remained active. Her Dr. Lysbeth Penner last note, she will require follow-up echo at some point in the future.   3. History of palpitations and supraventricular tachycardia: Quiescent. She is no longer taking beta blocker. She does have a prescription for as needed beta blocker.  4. Disposition: Follow-up stress testing. If this is normal, she can follow-up when necessary.  Murray Hodgkins, NP 09/14/2016, 2:47 PM

## 2016-09-14 NOTE — Patient Instructions (Signed)
Medication Instructions:  Your physician recommends that you continue on your current medications as directed. Please refer to the Current Medication list given to you today.  Labwork: NONE  Testing/Procedures: Your physician has requested that you have en exercise stress myoview. For further information please visit [email or web address]. Please follow instruction sheet, as given.  Follow-Up: Your physician recommends that you schedule a follow-up appointment in: AS NEEDED WITH DR HILTY  Any Other Special Instructions Will Be Listed Below (If Applicable).  WE WILL CONTACT YOU WITH THE RESULTS OF YOUR TEST  If you need a refill on your cardiac medications before your next appointment, please call your pharmacy.

## 2016-09-16 ENCOUNTER — Encounter: Payer: Self-pay | Admitting: Family Medicine

## 2016-09-16 ENCOUNTER — Ambulatory Visit (INDEPENDENT_AMBULATORY_CARE_PROVIDER_SITE_OTHER): Payer: PPO | Admitting: Family Medicine

## 2016-09-16 VITALS — BP 117/74 | HR 72 | Temp 98.3°F | Resp 20 | Ht 64.0 in | Wt 162.5 lb

## 2016-09-16 DIAGNOSIS — I471 Supraventricular tachycardia: Secondary | ICD-10-CM

## 2016-09-16 DIAGNOSIS — Z7689 Persons encountering health services in other specified circumstances: Secondary | ICD-10-CM

## 2016-09-16 DIAGNOSIS — M8580 Other specified disorders of bone density and structure, unspecified site: Secondary | ICD-10-CM | POA: Insufficient documentation

## 2016-09-16 NOTE — Progress Notes (Signed)
Patient ID: Jenny Hicks, female  DOB: 15-Oct-1951, 65 y.o.   MRN: [id number] Patient Care Team    Relationship Specialty Notifications Start End  Ma Hillock, DO PCP - General Family Medicine  09/16/16     Subjective:  Jenny Hicks is a 65 y.o.  female present for new patient establishment. All past medical history, surgical history, allergies, family history, immunizations, medications and social history were obtained and entered in the electronic medical record today. All recent labs, ED visits and hospitalizations within the last year were reviewed.  She has no complaints today.    Health maintenance:  Colonoscopy: completed 01/2011, by Dr. Olevia Perches, resutls inflammatory polyp. follow up 10 years. Mammogram: completed:07/2016, birads 1. FHx present in mother (prostate and ovarian cancers also in many members of family on paternal side) Cervical cancer screening: Hysterectomy 1989 (non-cancerous reasons), routinely follows with Dr. Stann Mainland (GYN) Immunizations: tdap 2009 UTD, Influenza declined (encouraged yearly), other immunizations unknown and will be offered at CPE Infectious disease screening: HIV and Hep C will be offered at CPE.  DEXA: 06/2016, Osteopenia (-1.9)  Immunization History  Administered Date(s) Administered  . Influenza-Unspecified 08/24/2014, 07/25/2016  . Tdap 11/09/2007  . Zoster 11/06/2011     Past Medical History:  Diagnosis Date  . Chicken pox   . GERD (gastroesophageal reflux disease)   . Heart palpitations   . History of nuclear stress test 03/2013   stress myoview; normal study; SVT (HR 200) - adenosine  . Mitral valve prolapse    a. 03/2013 Echo: EF 55-60%, mild MVP, mild to mod MR.  . SVT (supraventricular tachycardia) ([place])    a. 03/2013 occurred during ex MV-->resolved with adenosine and carotid massage.   No Known Allergies Past Surgical History:  Procedure Laterality Date  . ABDOMINAL HYSTERECTOMY    . COLONOSCOPY  01/12/2011  .  DIAGNOSTIC MAMMOGRAM  08/06/2014   digital  . TRANSTHORACIC ECHOCARDIOGRAM  03/2013   EF 55-60%, mild conc hypertrophy; mild MVP, mild-mod MR   Family History  Problem Relation Age of Onset  . Heart attack Mother     cervical & femur fractures  . Hypertension Mother     Alzheimer's  . Stroke Mother     Afib  . Breast cancer Mother   . Myasthenia gravis Father   . Prostate cancer Father   . Prostate cancer Brother   . Heart disease Brother   . Arthritis Maternal Aunt   . Arthritis Maternal Uncle   . Prostate cancer Maternal Uncle   . Early death Maternal Grandmother   . Heart disease Maternal Grandfather    Social History   Social History  . Marital status: Married    Spouse name: Richardson Landry  . Number of children: 5  . Years of education: N/A   Occupational History  . Not on file.   Social History Main Topics  . Smoking status: Former Research scientist (life sciences)  . Smokeless tobacco: Never Used     Comment: smoked while in college  . Alcohol use No  . Drug use: No  . Sexual activity: Yes   Other Topics Concern  . Not on file   Social History Narrative   [place] [address] [place] [address]     Medication List       Accurate as of 09/16/16  9:36 AM. Always use your most recent med list.          CALCIUM PO Take by mouth daily.  CENTRUM PO Take by mouth daily.   metoprolol succinate 25 MG 24 hr tablet Commonly known as:  TOPROL-XL Take 0.5 tablets (12.5 mg total) by mouth daily.        No results found for this or any previous visit (from the past 2160 hour(s)).  Mm Screening Breast Tomo Bilateral  Result Date: 08/03/2016 CLINICAL DATA:  Screening. EXAM: 2D DIGITAL SCREENING BILATERAL MAMMOGRAM WITH CAD AND ADJUNCT TOMO COMPARISON:  Previous exam(s). ACR Breast Density Category b: There are scattered areas of fibroglandular density. FINDINGS: There are no findings suspicious for malignancy. Images were processed with CAD. IMPRESSION: No mammographic evidence  of malignancy. A result letter of this screening mammogram will be mailed directly to the patient. RECOMMENDATION: Screening mammogram in one year. (Code:SM-B-01Y) BI-RADS CATEGORY  1: Negative. Electronically Signed   By: Nolon Nations M.D.   On: 08/03/2016 09:44     ROS: 14 pt review of systems performed and negative (unless mentioned in an HPI)  Objective: BP 117/74 (BP Location: Left Arm, Patient Position: Sitting, Cuff Size: Normal)   Pulse 72   Temp 98.3 F (36.8 C)   Resp 20   Ht 5\' 4"  (1.626 m)   Wt 162 lb 8 oz (73.7 kg)   SpO2 98%   BMI 27.89 kg/m  Gen: Afebrile. No acute distress. Nontoxic in appearance, well-developed, well-nourished,  Pleasant caucasian female.  HENT: AT. [place]. MMM Eyes:Pupils Equal Round Reactive to light, Extraocular movements intact,  Conjunctiva without redness, discharge or icterus. Neck/lymp/endocrine: Supple,no lymphadenopathy CV: RRR, no edema Chest: CTAB, no wheeze, rhonchi or crackles.  Abd: Soft. NTND. BS present.  Skin: Warm and well-perfused. Skin intact. Neuro/Msk: Normal gait. PERLA. EOMi. Alert. Oriented x3.   Psych: Normal affect, dress and demeanor. Normal speech. Normal thought content and judgment.   Assessment/plan: Jenny Hicks is a 65 y.o. female present for establishment of care.  Osteopenia, unspecified location Discussed vit d 1000 u daily and calcium 1200 mg (unless told not to take ca in the past), weight bearing exercise.  Repeat DEXA in 2-3 years.   Paroxysmal SVT (supraventricular tachycardia) ([facility]) Pt is scheduled for repeat stress tomorrow.  Continue Metoprolol PRN, she states she has not used in years.   Greater than 30 minutes spent with patient, >50% of time spent face to face    F/U end of Dec/JAN for CPE.   Electronically signed by: Howard Pouch, DO [place]

## 2016-09-16 NOTE — Patient Instructions (Signed)
It was a pleasure meeting you! If you need anything do not hesitate to make an appt.  Schedule your CPE Late DEC/early Jan depending on your last physical date with Dr. Stann Mainland.  Make sure you are taking 1000 u of vit D with your history of osteopenia.

## 2016-09-17 ENCOUNTER — Encounter: Payer: Self-pay | Admitting: Family Medicine

## 2016-09-17 ENCOUNTER — Telehealth (HOSPITAL_COMMUNITY): Payer: Self-pay

## 2016-09-17 DIAGNOSIS — E785 Hyperlipidemia, unspecified: Secondary | ICD-10-CM | POA: Insufficient documentation

## 2016-09-17 DIAGNOSIS — E663 Overweight: Secondary | ICD-10-CM | POA: Insufficient documentation

## 2016-09-17 NOTE — Telephone Encounter (Signed)
Encounter complete. 

## 2016-09-22 ENCOUNTER — Ambulatory Visit (HOSPITAL_COMMUNITY)
Admission: RE | Admit: 2016-09-22 | Discharge: 2016-09-22 | Disposition: A | Discharge status: Home / Self Care | Payer: PPO | Source: Ambulatory Visit | Attending: Cardiology | Admitting: Cardiology

## 2016-09-22 ENCOUNTER — Ambulatory Visit
Admission: RE | Admit: 2016-09-22 | Discharge: 2016-09-22 | Disposition: A | Discharge status: Home / Self Care | Payer: PPO | Source: Ambulatory Visit | Attending: Nurse Practitioner | Admitting: Nurse Practitioner

## 2016-09-22 ENCOUNTER — Other Ambulatory Visit: Payer: Self-pay | Admitting: Nurse Practitioner

## 2016-09-22 ENCOUNTER — Other Ambulatory Visit: Payer: Self-pay | Admitting: *Deleted

## 2016-09-22 DIAGNOSIS — R0602 Shortness of breath: Secondary | ICD-10-CM | POA: Diagnosis not present

## 2016-09-22 DIAGNOSIS — R042 Hemoptysis: Secondary | ICD-10-CM

## 2016-09-22 DIAGNOSIS — R05 Cough: Secondary | ICD-10-CM | POA: Diagnosis not present

## 2016-09-22 DIAGNOSIS — R079 Chest pain, unspecified: Secondary | ICD-10-CM | POA: Insufficient documentation

## 2016-09-22 LAB — MYOCARDIAL PERFUSION IMAGING
CHL CUP MPHR: 155 {beats}/min
CHL CUP NUCLEAR SDS: 0
CHL CUP RESTING HR STRESS: 74 {beats}/min
CHL RATE OF PERCEIVED EXERTION: 17
CSEPED: 7 min
CSEPEDS: 31 s
CSEPHR: 97 %
Estimated workload: 9.3 METS
LV sys vol: 30 mL
LVDIAVOL: 74 mL (ref 46–106)
Peak HR: 151 {beats}/min
SRS: 2
SSS: 2
TID: 1.17

## 2016-09-22 MED ORDER — TECHNETIUM TC 99M TETROFOSMIN IV KIT
29.7000 | PACK | Freq: Once | INTRAVENOUS | Status: AC | PRN
  Administered 2016-09-22: 29.7 via INTRAVENOUS
  Filled 2016-09-22: qty 30

## 2016-09-22 MED ORDER — TECHNETIUM TC 99M TETROFOSMIN IV KIT
11.0000 | PACK | Freq: Once | INTRAVENOUS | Status: AC | PRN
  Administered 2016-09-22: 11 via INTRAVENOUS
  Filled 2016-09-22: qty 11

## 2016-09-23 ENCOUNTER — Telehealth: Payer: Self-pay | Admitting: Nurse Practitioner

## 2016-09-23 NOTE — Telephone Encounter (Signed)
Follow Up:    Pt says she needs more clarification on her stress test results please.

## 2016-09-23 NOTE — Telephone Encounter (Signed)
Returned pts call and left her a message to call back. 

## 2016-09-24 ENCOUNTER — Telehealth: Payer: Self-pay | Admitting: Nurse Practitioner

## 2016-09-24 LAB — D-DIMER, QUANTITATIVE

## 2016-09-24 NOTE — Telephone Encounter (Signed)
The [facility] called to report they had contacted the patient and she is out of town and will not copme back today for lab work. She will return once she is back in town.

## 2016-09-24 NOTE — Telephone Encounter (Signed)
Received a call from [place] downstairs at [facility] and she said she was not in on Wednesday and something happened with pt's D-dimer test but doesn't know what happened.  Called Raiann back and left detailed message to call pt ASAP for another Blood draw.

## 2016-10-05 ENCOUNTER — Other Ambulatory Visit: Payer: Self-pay | Admitting: Nurse Practitioner

## 2016-10-05 DIAGNOSIS — R0602 Shortness of breath: Secondary | ICD-10-CM | POA: Diagnosis not present

## 2016-10-06 LAB — D-DIMER, QUANTITATIVE: D-Dimer, Quant: 0.35 mcg/mL FEU (ref <0.50)

## 2016-10-08 ENCOUNTER — Encounter: Payer: Self-pay | Admitting: Family Medicine

## 2016-10-08 ENCOUNTER — Ambulatory Visit (INDEPENDENT_AMBULATORY_CARE_PROVIDER_SITE_OTHER): Payer: PPO | Admitting: Family Medicine

## 2016-10-08 VITALS — BP 125/85 | HR 66 | Temp 98.1°F | Resp 20 | Ht 64.0 in | Wt 164.8 lb

## 2016-10-08 DIAGNOSIS — Z1159 Encounter for screening for other viral diseases: Secondary | ICD-10-CM | POA: Diagnosis not present

## 2016-10-08 DIAGNOSIS — Z130 Encounter for screening for diseases of the blood and blood-forming organs and certain disorders involving the immune mechanism: Secondary | ICD-10-CM | POA: Diagnosis not present

## 2016-10-08 DIAGNOSIS — Z6827 Body mass index (BMI) 27.0-27.9, adult: Secondary | ICD-10-CM

## 2016-10-08 DIAGNOSIS — R490 Dysphonia: Secondary | ICD-10-CM

## 2016-10-08 DIAGNOSIS — Z23 Encounter for immunization: Secondary | ICD-10-CM

## 2016-10-08 DIAGNOSIS — Z114 Encounter for screening for human immunodeficiency virus [HIV]: Secondary | ICD-10-CM

## 2016-10-08 DIAGNOSIS — Z1329 Encounter for screening for other suspected endocrine disorder: Secondary | ICD-10-CM

## 2016-10-08 DIAGNOSIS — Z131 Encounter for screening for diabetes mellitus: Secondary | ICD-10-CM | POA: Diagnosis not present

## 2016-10-08 DIAGNOSIS — M8580 Other specified disorders of bone density and structure, unspecified site: Secondary | ICD-10-CM

## 2016-10-08 DIAGNOSIS — Z0000 Encounter for general adult medical examination without abnormal findings: Secondary | ICD-10-CM | POA: Diagnosis not present

## 2016-10-08 DIAGNOSIS — E785 Hyperlipidemia, unspecified: Secondary | ICD-10-CM | POA: Diagnosis not present

## 2016-10-08 LAB — CBC WITH DIFFERENTIAL/PLATELET
BASOS ABS: 0 10*3/uL (ref 0.0–0.1)
Basophils Relative: 0.4 % (ref 0.0–3.0)
EOS ABS: 0.1 10*3/uL (ref 0.0–0.7)
Eosinophils Relative: 1.8 % (ref 0.0–5.0)
HCT: 40.2 % (ref 36.0–46.0)
Hemoglobin: 13.6 g/dL (ref 12.0–15.0)
LYMPHS ABS: 1.8 10*3/uL (ref 0.7–4.0)
Lymphocytes Relative: 30.4 % (ref 12.0–46.0)
MCHC: 33.9 g/dL (ref 30.0–36.0)
MCV: 89.4 fl (ref 78.0–100.0)
MONO ABS: 0.4 10*3/uL (ref 0.1–1.0)
MONOS PCT: 6.2 % (ref 3.0–12.0)
NEUTROS ABS: 3.6 10*3/uL (ref 1.4–7.7)
NEUTROS PCT: 61.2 % (ref 43.0–77.0)
PLATELETS: 283 10*3/uL (ref 150.0–400.0)
RBC: 4.49 Mil/uL (ref 3.87–5.11)
RDW: 13.2 % (ref 11.5–15.5)
WBC: 5.9 10*3/uL (ref 4.0–10.5)

## 2016-10-08 LAB — COMPREHENSIVE METABOLIC PANEL
ALK PHOS: 58 U/L (ref 39–117)
ALT: 21 U/L (ref 0–35)
AST: 19 U/L (ref 0–37)
Albumin: 4.1 g/dL (ref 3.5–5.2)
BILIRUBIN TOTAL: 0.6 mg/dL (ref 0.2–1.2)
BUN: 17 mg/dL (ref 6–23)
CO2: 30 meq/L (ref 19–32)
Calcium: 9.1 mg/dL (ref 8.4–10.5)
Chloride: 104 mEq/L (ref 96–112)
Creatinine, Ser: 0.66 mg/dL (ref 0.40–1.20)
GFR: 95.41 mL/min (ref >60.00)
GLUCOSE: 84 mg/dL (ref 70–99)
Potassium: 4.3 mEq/L (ref 3.5–5.1)
SODIUM: 142 meq/L (ref 135–145)
TOTAL PROTEIN: 6.7 g/dL (ref 6.0–8.3)

## 2016-10-08 LAB — LIPID PANEL
CHOL/HDL RATIO: 4
CHOLESTEROL: 202 mg/dL — AB (ref 0–200)
HDL: 50.8 mg/dL (ref >39.00)
LDL CALC: 129 mg/dL — AB (ref 0–99)
NonHDL: 150.76
TRIGLYCERIDES: 111 mg/dL (ref 0.0–149.0)
VLDL: 22.2 mg/dL (ref 0.0–40.0)

## 2016-10-08 LAB — VITAMIN D 25 HYDROXY (VIT D DEFICIENCY, FRACTURES): VITD: 28.15 ng/mL — AB (ref 30.00–100.00)

## 2016-10-08 LAB — TSH: TSH: 1.49 u[IU]/mL (ref 0.35–4.50)

## 2016-10-08 LAB — HEMOGLOBIN A1C: Hgb A1c MFr Bld: 5.5 % (ref 4.6–6.5)

## 2016-10-08 NOTE — Patient Instructions (Signed)
It was a pleasure seeing you today.  Make certain to supplement diet with calcium 1200 mg and vit D 1000 u daily, as well as continue weight bearing exercises to help with bone health. We will repeat your bone density in 2-3 years.   Try allergy medicine and mucinex to see if that reduces your phlegm production in the morning. If you see no improvement after 2 week of using, then would encourage you try Prilosec (can get OTC) and try 4 weeks. f neither of them decrease your symptoms, then I would want to see you.   Health Maintenance, Female Introduction Adopting a healthy lifestyle and getting preventive care can go a long way to promote health and wellness. Talk with your health care provider about what schedule of regular examinations is right for you. This is a good chance for you to check in with your provider about disease prevention and staying healthy. In between checkups, there are plenty of things you can do on your own. Experts have done a lot of research about which lifestyle changes and preventive measures are most likely to keep you healthy. Ask your health care provider for more information. Weight and diet Eat a healthy diet  Be sure to include plenty of vegetables, fruits, low-fat dairy products, and lean protein.  Do not eat a lot of foods high in solid fats, added sugars, or salt.  Get regular exercise. This is one of the most important things you can do for your health.  Most adults should exercise for at least 150 minutes each week. The exercise should increase your heart rate and make you sweat (moderate-intensity exercise).  Most adults should also do strengthening exercises at least twice a week. This is in addition to the moderate-intensity exercise. Maintain a healthy weight  Body mass index (BMI) is a measurement that can be used to identify possible weight problems. It estimates body fat based on height and weight. Your health care provider can help determine your  BMI and help you achieve or maintain a healthy weight.  For females 35 years of age and older:  A BMI below 18.5 is considered underweight.  A BMI of 18.5 to 24.9 is normal.  A BMI of 25 to 29.9 is considered overweight.  A BMI of 30 and above is considered obese. Watch levels of cholesterol and blood lipids  You should start having your blood tested for lipids and cholesterol at 65 years of age, then have this test every 5 years.  You may need to have your cholesterol levels checked more often if:  Your lipid or cholesterol levels are high.  You are older than 65 years of age.  You are at high risk for heart disease. Cancer screening Lung Cancer  Lung cancer screening is recommended for adults 31-47 years old who are at high risk for lung cancer because of a history of smoking.  A yearly low-dose CT scan of the lungs is recommended for people who:  Currently smoke.  Have quit within the past 15 years.  Have at least a 30-pack-year history of smoking. A pack year is smoking an average of one pack of cigarettes a day for 1 year.  Yearly screening should continue until it has been 15 years since you quit.  Yearly screening should stop if you develop a health problem that would prevent you from having lung cancer treatment. Breast Cancer  Practice breast self-awareness. This means understanding how your breasts normally appear and feel.  It  also means doing regular breast self-exams. Let your health care provider know about any changes, no matter how small.  If you are in your 20s or 30s, you should have a clinical breast exam (CBE) by a health care provider every 1-3 years as part of a regular health exam.  If you are 34 or older, have a CBE every year. Also consider having a breast X-ray (mammogram) every year.  If you have a family history of breast cancer, talk to your health care provider about genetic screening.  If you are at high risk for breast cancer, talk to  your health care provider about having an MRI and a mammogram every year.  Breast cancer gene (BRCA) assessment is recommended for women who have family members with BRCA-related cancers. BRCA-related cancers include:  Breast.  Ovarian.  Tubal.  Peritoneal cancers.  Results of the assessment will determine the need for genetic counseling and BRCA1 and BRCA2 testing. Cervical Cancer  Your health care provider may recommend that you be screened regularly for cancer of the pelvic organs (ovaries, uterus, and vagina). This screening involves a pelvic examination, including checking for microscopic changes to the surface of your cervix (Pap test). You may be encouraged to have this screening done every 3 years, beginning at age 33.  For women ages 56-65, health care providers may recommend pelvic exams and Pap testing every 3 years, or they may recommend the Pap and pelvic exam, combined with testing for human papilloma virus (HPV), every 5 years. Some types of HPV increase your risk of cervical cancer. Testing for HPV may also be done on women of any age with unclear Pap test results.  Other health care providers may not recommend any screening for nonpregnant women who are considered low risk for pelvic cancer and who do not have symptoms. Ask your health care provider if a screening pelvic exam is right for you.  If you have had past treatment for cervical cancer or a condition that could lead to cancer, you need Pap tests and screening for cancer for at least 20 years after your treatment. If Pap tests have been discontinued, your risk factors (such as having a new sexual partner) need to be reassessed to determine if screening should resume. Some women have medical problems that increase the chance of getting cervical cancer. In these cases, your health care provider may recommend more frequent screening and Pap tests. Colorectal Cancer  This type of cancer can be detected and often  prevented.  Routine colorectal cancer screening usually begins at 65 years of age and continues through 65 years of age.  Your health care provider may recommend screening at an earlier age if you have risk factors for colon cancer.  Your health care provider may also recommend using home test kits to check for hidden blood in the stool.  A small camera at the end of a tube can be used to examine your colon directly (sigmoidoscopy or colonoscopy). This is done to check for the earliest forms of colorectal cancer.  Routine screening usually begins at age 39.  Direct examination of the colon should be repeated every 5-10 years through 65 years of age. However, you may need to be screened more often if early forms of precancerous polyps or small growths are found. Skin Cancer  Check your skin from head to toe regularly.  Tell your health care provider about any new moles or changes in moles, especially if there is a change in a  mole's shape or color.  Also tell your health care provider if you have a mole that is larger than the size of a pencil eraser.  Always use sunscreen. Apply sunscreen liberally and repeatedly throughout the day.  Protect yourself by wearing long sleeves, pants, a wide-brimmed hat, and sunglasses whenever you are outside. Heart disease, diabetes, and high blood pressure  High blood pressure causes heart disease and increases the risk of stroke. High blood pressure is more likely to develop in:  People who have blood pressure in the high end of the normal range (130-139/85-89 mm Hg).  People who are overweight or obese.  People who are African American.  If you are 75-13 years of age, have your blood pressure checked every 3-5 years. If you are 8 years of age or older, have your blood pressure checked every year. You should have your blood pressure measured twice-once when you are at a hospital or clinic, and once when you are not at a hospital or clinic. Record  the average of the two measurements. To check your blood pressure when you are not at a hospital or clinic, you can use:  An automated blood pressure machine at a pharmacy.  A home blood pressure monitor.  If you are between 57 years and 45 years old, ask your health care provider if you should take aspirin to prevent strokes.  Have regular diabetes screenings. This involves taking a blood sample to check your fasting blood sugar level.  If you are at a normal weight and have a low risk for diabetes, have this test once every three years after 65 years of age.  If you are overweight and have a high risk for diabetes, consider being tested at a younger age or more often. Preventing infection Hepatitis B  If you have a higher risk for hepatitis B, you should be screened for this virus. You are considered at high risk for hepatitis B if:  You were born in a country where hepatitis B is common. Ask your health care provider which countries are considered high risk.  Your parents were born in a high-risk country, and you have not been immunized against hepatitis B (hepatitis B vaccine).  You have HIV or AIDS.  You use needles to inject street drugs.  You live with someone who has hepatitis B.  You have had sex with someone who has hepatitis B.  You get hemodialysis treatment.  You take certain medicines for conditions, including cancer, organ transplantation, and autoimmune conditions. Hepatitis C  Blood testing is recommended for:  Everyone born from [birth year] through 1965.  Anyone with known risk factors for hepatitis C. Sexually transmitted infections (STIs)  You should be screened for sexually transmitted infections (STIs) including gonorrhea and chlamydia if:  You are sexually active and are younger than 65 years of age.  You are older than 65 years of age and your health care provider tells you that you are at risk for this type of infection.  Your sexual activity has  changed since you were last screened and you are at an increased risk for chlamydia or gonorrhea. Ask your health care provider if you are at risk.  If you do not have HIV, but are at risk, it may be recommended that you take a prescription medicine daily to prevent HIV infection. This is called pre-exposure prophylaxis (PrEP). You are considered at risk if:  You are sexually active and do not regularly use condoms or know the HIV  status of your partner(s).  You take drugs by injection.  You are sexually active with a partner who has HIV. Talk with your health care provider about whether you are at high risk of being infected with HIV. If you choose to begin PrEP, you should first be tested for HIV. You should then be tested every 3 months for as long as you are taking PrEP. Pregnancy  If you are premenopausal and you may become pregnant, ask your health care provider about preconception counseling.  If you may become pregnant, take 400 to 800 micrograms (mcg) of folic acid every day.  If you want to prevent pregnancy, talk to your health care provider about birth control (contraception). Osteoporosis and menopause  Osteoporosis is a disease in which the bones lose minerals and strength with aging. This can result in serious bone fractures. Your risk for osteoporosis can be identified using a bone density scan.  If you are 18 years of age or older, or if you are at risk for osteoporosis and fractures, ask your health care provider if you should be screened.  Ask your health care provider whether you should take a calcium or vitamin D supplement to lower your risk for osteoporosis.  Menopause may have certain physical symptoms and risks.  Hormone replacement therapy may reduce some of these symptoms and risks. Talk to your health care provider about whether hormone replacement therapy is right for you. Follow these instructions at home:  Schedule regular health, dental, and eye  exams.  Stay current with your immunizations.  Do not use any tobacco products including cigarettes, chewing tobacco, or electronic cigarettes.  If you are pregnant, do not drink alcohol.  If you are breastfeeding, limit how much and how often you drink alcohol.  Limit alcohol intake to no more than 1 drink per day for nonpregnant women. One drink equals 12 ounces of beer, 5 ounces of wine, or 1 ounces of hard liquor.  Do not use street drugs.  Do not share needles.  Ask your health care provider for help if you need support or information about quitting drugs.  Tell your health care provider if you often feel depressed.  Tell your health care provider if you have ever been abused or do not feel safe at home. This information is not intended to replace advice given to you by your health care provider. Make sure you discuss any questions you have with your health care provider. Document Released: 05/10/2011 Document Revised: 04/01/2016 Document Reviewed: 07/29/2015  2017 Elsevier

## 2016-10-08 NOTE — Progress Notes (Signed)
Patient ID: [name], female  DOB: 1951/05/22, 65 y.o.   MRN: [id number] Patient Care Team    Relationship Specialty Notifications Start End  Ma Hillock, DO PCP - General Family Medicine  09/16/16   Pixie Casino, MD Consulting Physician Cardiology  09/16/16   Vania Rea, MD Consulting Physician Obstetrics and Gynecology  09/16/16     Subjective:  Jenny Hicks is a 65 y.o.  Female  present for CPE. All past medical history, surgical history, allergies, family history, immunizations, medications and social history were updated  in the electronic medical record today. All recent labs, ED visits and hospitalizations within the last year were reviewed.  Pts complains of increased phlegm production for the last few weeks. This occurs more frequently in the morning, it is clear most of the time. Sometimes she does see blood tinged streaks in the mucous. She states she smoked very briefly, socially when she was in college only. She has had a h/o of Gerd in the past. She currently does not endorse heartburn, but does endorse hoarseness. She does not take medication for GERD.   Health maintenance:  Colonoscopy: completed 01/2011, by Dr. Olevia Perches, resutls inflammatory polyp. follow up 10 years. Mammogram: completed:07/2016, birads 1. FHx present in mother (prostate and ovarian cancers also in many members of family on paternal side) Cervical cancer screening: Hysterectomy 1989 (non-cancerous reasons), routinely follows with Dr. Stann Mainland (GYN) 09/2015 (may want future PAPs with PCP- Fhx ovarian cancer) Immunizations: tdap 2009 UTD, Influenza declined (encouraged yearly), prevnar 13 admin today, Pneumovax due 2018, zostavax completed.  Infectious disease screening:Hep C and HIV Collected today.  DEXA: 06/2016, Osteopenia (-1.9), ca/vit d (1000u). Repeat 2-3 years.  Assistive device: None Oxygen use: none Patient has a Dental home. Hospitalizations/ED visits: none  Immunization History    Administered Date(s) Administered  . Influenza-Unspecified 08/24/2014, 07/25/2016  . Tdap 11/09/2007  . Zoster 11/06/2011     Past Medical History:  Diagnosis Date  . Chicken pox   . GERD (gastroesophageal reflux disease)   . Heart palpitations   . History of nuclear stress test 03/2013   stress myoview; normal study; SVT (HR 200) - adenosine  . 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 Mitral valve prolapse    a. 03/2013 Echo: EF 55-60%, mild MVP, mild to mod MR.  . Osteopenia 2017  . SVT (supraventricular tachycardia) ([place])    a. 03/2013 occurred during ex MV-->resolved with adenosine and carotid massage.   No Known Allergies Past Surgical History:  Procedure Laterality Date  . ABDOMINAL HYSTERECTOMY  1989   partial; pt has both ovaries  . COLONOSCOPY  01/12/2011  . DIAGNOSTIC MAMMOGRAM  08/06/2014   digital  . TRANSTHORACIC ECHOCARDIOGRAM  03/2013   EF 55-60%, mild conc hypertrophy; mild MVP, mild-mod MR   Family History  Problem Relation Age of Onset  . Heart attack Mother   . Hypertension Mother     Alzheimer's  . Stroke Mother     Afib  . Breast cancer Mother   . Osteoporosis Mother     cervical/femur fracture  . Myasthenia gravis Father   . Prostate cancer Father   . Prostate cancer Brother   . Heart disease Brother   . Arthritis Maternal Aunt   . Arthritis Maternal Uncle   . Prostate cancer Maternal Uncle   . Early death Maternal Grandmother   . Heart disease Maternal Grandfather   . Ovarian cancer Paternal Grandmother    Social  History   Social History  . Marital status: Married    Spouse name: Richardson Landry  . Number of children: 5  . Years of education: 71   Occupational History  . retired    Social History Main Topics  . Smoking status: Former Research scientist (life sciences)  . Smokeless tobacco: Never Used     Comment: smoked while in college  . Alcohol use No  . Drug use: No  . Sexual activity: Yes    Partners: Male     Comment: married   Other Topics Concern  . Not on file    Social History Narrative   Married to [place], they have 5 children.    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   Drinks caffeine, takes a daily vitamin   Wears seatbelt, smoke detector in the home, no firearms in the home.    Feels safe in her relationships.         Medication List       Accurate as of 10/08/16 11:26 AM. Always use your most recent med list.          CALCIUM PO Take by mouth daily.   CENTRUM PO Take by mouth daily.   metoprolol succinate 25 MG 24 hr tablet Commonly known as:  TOPROL-XL Take 0.5 tablets (12.5 mg total) by mouth daily.        Recent Results (from the past 2160 hour(s))  Myocardial Perfusion Imaging     Status: None   Collection Time: 09/22/16 12:32 PM  Result Value Ref Range   Rest HR 74 bpm   Rest BP 155/96 mmHg   RPE 17    Exercise duration (sec) 31 sec   Percent HR 97 %   Exercise duration (min) 7 min   Estimated workload 9.3 METS   Peak HR 151 bpm   Peak BP 177/89 mmHg   MPHR 155 bpm   SSS 2    SRS 2    SDS 0    TID 1.17    LV sys vol 30 mL   LV dias vol 74 46 - 106 mL  D-Dimer, Quantitative     Status: None   Collection Time: 09/22/16  1:52 PM  Result Value Ref Range   D-Dimer, Quant CANCELED <0.50 mcg/mL FEU    Comment: Test not performed.  Specimen received clotted.     The D-Dimer test is used frequently to exclude an acute PE or DVT.  In patients with a low to moderate clinical risk assessment and a D-Dimer result <0.50 mcg/mL FEU, the likelihood of a PE or DVT is very low.  However, a thromboembolic event should not be excluded solely on the basis of the D-Dimer level.  Increased levels of D-Dimer are associated with a PE, DVT, DIC, malignancies, inflammation, sepsis, surgery, trauma, pregnancy, and advancing patient age. [Jama 2006 11:295(2): S2271310   For additional information, please refer to: http://education.questdiagnostics.com/faq/FAQ149 (This link is being provided  for information/ educational purposes only)    Result canceled by the ancillary   D-dimer, quantitative (not at [facility])     Status: None   Collection Time: 10/05/16 12:09 PM  Result Value Ref Range   D-Dimer, Quant 0.35 <0.50 mcg/mL FEU    Comment:   The D-Dimer test is used frequently to exclude an acute PE or DVT.  In patients with a low to moderate clinical risk assessment and a D-Dimer result <0.50 mcg/mL FEU, the likelihood  of a PE or DVT is very low.  However, a thromboembolic event should not be excluded solely on the basis of the D-Dimer level.  Increased levels of D-Dimer are associated with a PE, DVT, DIC, malignancies, inflammation, sepsis, surgery, trauma, pregnancy, and advancing patient age. [Jama 2006 11:295(2): [id number]   For additional information, please refer to: http://education.questdiagnostics.com/faq/FAQ149 (This link is being provided for information/ educational purposes only)       Dg Chest 2 View  Result Date: 09/22/2016 CLINICAL DATA:  Cough, congestion, hemoptysis.  Nonsmoker. EXAM: CHEST  2 VIEW COMPARISON:  07/12/2008 FINDINGS: The heart size and mediastinal contours are within normal limits. The thoracic aorta is slightly uncoiled in appearance without aneurysmal dilatation. Both lungs are clear. The visualized skeletal structures are unremarkable. IMPRESSION: No active cardiopulmonary disease. Electronically Signed   By: Ashley Royalty M.D.   On: 09/22/2016 15:07     ROS: 14 pt review of systems performed and negative (unless mentioned in an HPI)  Objective: BP 125/85 (BP Location: Right Arm, Patient Position: Sitting, Cuff Size: Normal)   Pulse 66   Temp 98.1 F (36.7 C) (Oral)   Resp 20   Ht 5\' 4"  (1.626 m)   Wt 164 lb 12 oz (74.7 kg)   SpO2 99%   BMI 28.28 kg/m  Gen: Afebrile. No acute distress. Nontoxic in appearance, well-developed, well-nourished,  Pleasant caucasian female.  HENT: AT. [place]. Bilateral TM visualized and normal in  appearance, normal external auditory canal. MMM, no oral lesions, adequate dentition. Bilateral nares within normal limits. Throat without erythema, ulcerations or exudates. no Cough on exam, no hoarseness on exam. Eyes:Pupils Equal Round Reactive to light, Extraocular movements intact,  Conjunctiva without redness, discharge or icterus. Neck/lymp/endocrine: Supple,no lymphadenopathy, no thyromegaly CV: RRR no murmur, no edema, +2/4 P posterior tibialis pulses. no carotid bruits. No JVD. Chest: CTAB, no wheeze, rhonchi or crackles. Normal  Respiratory effort. good Air movement. Abd: Soft. Obese . NTND. BS present. no Masses palpated. No hepatosplenomegaly. No rebound tenderness or guarding. Skin: no rashes, purpura or petechiae. Warm and well-perfused. Skin intact. Neuro/Msk: Normal gait. PERLA. EOMi. Alert. Oriented x3.  Cranial nerves II through XII intact. Muscle strength 5/5 upper/lower extremity. DTRs equal bilaterally. Psych: Normal affect, dress and demeanor. Normal speech. Normal thought content and judgment.  Assessment/plan: Jenny Hicks is a 65 y.o. female present for CPE. Encounter for preventive health examination BMI 27.0-27.9,adult Osteopenia, unspecified location Patient was encouraged to exercise greater than 150 minutes a week. Patient was encouraged to choose a diet filled with fresh fruits and vegetables, and lean meats. AVS provided to patient today for education/recommendation on gender specific health and safety maintenance. Colonoscopy: completed 01/2011, by Dr. Olevia Perches, resutls inflammatory polyp. follow up 10 years. Mammogram: completed:07/2016, birads 1. FHx present in mother (prostate and ovarian cancers also in many members of family on paternal side) Cervical cancer screening: Hysterectomy 1989 (non-cancerous reasons), routinely follows with Dr. Stann Mainland (GYN) 09/2015 (may want future PAPs with PCP- Fhx ovarian cancer) Immunizations: tdap 2009 UTD, Influenza declined  (encouraged yearly), prevnar 13 admin today, Pneumovax due 2018, zostavax completed.  Infectious disease screening:Hep C and HIV Collected today.  DEXA: 06/2016, Osteopenia (-1.9), ca/vit d (1000u). Repeat 2-3 years. Pt does not desire prescribed medications. Advised to continue weight bearing exercise as well. Hyperlipidemia, unspecified hyperlipidemia type - h/o hyperlipidemia. She is currently not taking medications.  - FHX of heart disease, MI, stroke.  - with pts h/o, fhx and age, would advise her  to take a daily baby ASA if able to tolerate.  Hoarseness:  - discussed with pt, hoarseness can be from allergies, GERD or other etiology.  - I would recommend she try plain mucinex and/or OTC antihistamine for a few weeks if this is not effective, she can try OTC prilosec for 2-4 weeks. If condition still persists she should make an appt to be seen.    Return in about 1 year (around 10/08/2017) for CPE.  Electronically signed by: Howard Pouch, DO [place]

## 2016-10-09 LAB — HEPATITIS C ANTIBODY: HCV AB: NEGATIVE

## 2016-10-09 LAB — HIV ANTIBODY (ROUTINE TESTING W REFLEX): HIV: NONREACTIVE

## 2016-10-11 ENCOUNTER — Telehealth: Payer: Self-pay | Admitting: Family Medicine

## 2016-10-11 ENCOUNTER — Encounter: Payer: Self-pay | Admitting: Family Medicine

## 2016-10-11 NOTE — Telephone Encounter (Signed)
Patient notified of instructions and verbalized understanding.

## 2016-10-11 NOTE — Telephone Encounter (Signed)
Please call pt: - her labs are normal with the exception of mildly low vit d (28) and mildly elevated cholesterol (LDL 129). I would have her increase her at home supplement of vit d to 2000u daily with a meal.  - I would also recommend a daily baby ASA and fish oil supplement.

## 2016-12-06 ENCOUNTER — Ambulatory Visit (INDEPENDENT_AMBULATORY_CARE_PROVIDER_SITE_OTHER): Payer: PPO | Admitting: Family Medicine

## 2016-12-06 ENCOUNTER — Encounter: Payer: Self-pay | Admitting: Family Medicine

## 2016-12-06 VITALS — BP 125/82 | HR 77 | Temp 98.1°F | Resp 20 | Wt 164.5 lb

## 2016-12-06 DIAGNOSIS — J0100 Acute maxillary sinusitis, unspecified: Secondary | ICD-10-CM | POA: Diagnosis not present

## 2016-12-06 MED ORDER — AMOXICILLIN-POT CLAVULANATE 875-125 MG PO TABS: 1.0000 | ORAL_TABLET | Freq: Two times a day (BID) | ORAL | 0 refills | Status: DC

## 2016-12-06 NOTE — Progress Notes (Signed)
Jenny Hicks , [birth year], 66 y.o., female MRN: [id number] Patient Care Team    Relationship Specialty Notifications Start End  Jenny Hillock, DO PCP - General Family Medicine  09/16/16   Jenny Casino, MD Consulting Physician Cardiology  09/16/16   Jenny Rea, MD Consulting Physician Obstetrics and Gynecology  09/16/16     CC: cough  Subjective: Pt presents for an acute OV with complaints of cough  of 5 days duration.  She states she had "larygitis" last week, she has a soreness on her left side of her throat. Associated symptoms include productive cough, nasal/chest, tooth pain, sinus pressure. Pt has tried mucinex to ease their symptoms.  She denies fever, chills, nausea, vomit.   No Known Allergies Social History  Substance Use Topics  . Smoking status: Former Research scientist (life sciences)  . Smokeless tobacco: Never Used     Comment: smoked while in college  . Alcohol use No   Past Medical History:  Diagnosis Date  . Bone spur of right foot   . Chicken pox   . DDD (degenerative disc disease), cervical   . GERD (gastroesophageal reflux disease)   . Heart palpitations   . History of nuclear stress test 03/2013   stress myoview; normal study; SVT (HR 200) - adenosine  . 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 Mitral valve prolapse    a. 03/2013 Echo: EF 55-60%, mild MVP, mild to mod MR.  . Osteopenia 2017  . Scoliosis   . SVT (supraventricular tachycardia) ([place])    a. 03/2013 occurred during ex MV-->resolved with adenosine and carotid massage.   Past Surgical History:  Procedure Laterality Date  . ABDOMINAL HYSTERECTOMY  1989   partial; pt has both ovaries  . COLONOSCOPY  01/12/2011  . DIAGNOSTIC MAMMOGRAM  08/06/2014   digital  . TRANSTHORACIC ECHOCARDIOGRAM  03/2013   EF 55-60%, mild conc hypertrophy; mild MVP, mild-mod MR   Family History  Problem Relation Age of Onset  . Heart attack Mother   . Hypertension Mother     Alzheimer's  . Stroke Mother     Afib  . Breast cancer Mother   .  Osteoporosis Mother     cervical/femur fracture  . Myasthenia gravis Father   . Prostate cancer Father   . Prostate cancer Brother   . Heart disease Brother   . Arthritis Maternal Aunt   . Arthritis Maternal Uncle   . Prostate cancer Maternal Uncle   . Early death Maternal Grandmother   . Heart disease Maternal Grandfather   . Ovarian cancer Paternal Grandmother    Allergies as of 12/06/2016   No Known Allergies     Medication List       Accurate as of 12/06/16 10:05 AM. Always use your most recent med list.          CALCIUM PO Take by mouth daily.   CENTRUM PO Take by mouth daily.   cholecalciferol 400 units Tabs tablet Commonly known as:  VITAMIN D Take 1,200 Units by mouth daily.   metoprolol succinate 25 MG 24 hr tablet Commonly known as:  TOPROL-XL Take 0.5 tablets (12.5 mg total) by mouth daily.       No results found for this or any previous visit (from the past 24 hour(s)). No results found.   ROS: Negative, with the exception of above mentioned in HPI   Objective:  BP 125/82 (BP Location: Right Arm, Patient Position: Sitting, Cuff Size: Normal)   Pulse 77  Temp 98.1 F (36.7 C)   Resp 20   Wt 164 lb 8 oz (74.6 kg)   SpO2 98%   BMI 28.24 kg/m  Body mass index is 28.24 kg/m. Gen: Afebrile. No acute distress. Nontoxic in appearance, well developed, well nourished.  HENT: AT. [place]. Bilateral TM visualized with bilateral pink hue and air fluid levels.   MMM, no oral lesions. Bilateral nares erythema and drainage. Throat without erythema or exudates. Cough, hoarseness and tenderness sinus.  Eyes:Pupils Equal Round Reactive to light, Extraocular movements intact,  Conjunctiva without redness, discharge or icterus. Neck/lymp/endocrine: Supple,right ant cervical  lymphadenopathy CV: RRR  Chest: CTAB, no wheeze or crackles. Good air movement, normal resp effort.  Abd: Soft. NTND. BS present  Assessment/Plan: Jenny Hicks is a 66 y.o. female present  for acute OV for  1. Acute maxillary sinusitis, recurrence not specified - rest, hydrate. Mucinex.  - amoxicillin-clavulanate (AUGMENTIN) 875-125 MG tablet; Take 1 tablet by mouth 2 (two) times daily.  Dispense: 20 tablet; Refill: 0 - F/U PRN   electronically signed by:  Jenny Pouch, DO  [place]

## 2016-12-06 NOTE — Patient Instructions (Signed)
Rest, hydrate. Mucinex.  Amoxicillin prescribed every 12 hours for 10 days.    You have a sinus infection.    Sinusitis, Adult Sinusitis is soreness and inflammation of your sinuses. Sinuses are hollow spaces in the bones around your face. They are located:  Around your eyes.  In the middle of your forehead.  Behind your nose.  In your cheekbones. Your sinuses and nasal passages are lined with a stringy fluid (mucus). Mucus normally drains out of your sinuses. When your nasal tissues get inflamed or swollen, the mucus can get trapped or blocked so air cannot flow through your sinuses. This lets bacteria, viruses, and funguses grow, and that leads to infection. Follow these instructions at home: Medicines  Take, use, or apply over-the-counter and prescription medicines only as told by your doctor. These may include nasal sprays.  If you were prescribed an antibiotic medicine, take it as told by your doctor. Do not stop taking the antibiotic even if you start to feel better. Hydrate and Humidify  Drink enough water to keep your pee (urine) clear or pale yellow.  Use a cool mist humidifier to keep the humidity level in your home above 50%.  Breathe in steam for 10-15 minutes, 3-4 times a day or as told by your doctor. You can do this in the bathroom while a hot shower is running.  Try not to spend time in cool or dry air. Rest  Rest as much as possible.  Sleep with your head raised (elevated).  Make sure to get enough sleep each night. General instructions  Put a warm, moist washcloth on your face 3-4 times a day or as told by your doctor. This will help with discomfort.  Wash your hands often with soap and water. If there is no soap and water, use hand sanitizer.  Do not smoke. Avoid being around people who are smoking (secondhand smoke).  Keep all follow-up visits as told by your doctor. This is important. Contact a doctor if:  You have a fever.  Your symptoms get  worse.  Your symptoms do not get better within 10 days. Get help right away if:  You have a very bad headache.  You cannot stop throwing up (vomiting).  You have pain or swelling around your face or eyes.  You have trouble seeing.  You feel confused.  Your neck is stiff.  You have trouble breathing. This information is not intended to replace advice given to you by your health care provider. Make sure you discuss any questions you have with your health care provider. Document Released: 04/12/2008 Document Revised: 06/20/2016 Document Reviewed: 08/20/2015 Elsevier Interactive Patient Education  2017 [employer].

## 2017-06-27 ENCOUNTER — Other Ambulatory Visit: Payer: Self-pay | Admitting: Obstetrics & Gynecology

## 2017-06-27 DIAGNOSIS — Z1231 Encounter for screening mammogram for malignant neoplasm of breast: Secondary | ICD-10-CM

## 2017-07-13 ENCOUNTER — Encounter: Payer: Self-pay | Admitting: Nurse Practitioner

## 2017-07-15 ENCOUNTER — Ambulatory Visit (INDEPENDENT_AMBULATORY_CARE_PROVIDER_SITE_OTHER): Payer: PPO | Admitting: Family Medicine

## 2017-07-15 ENCOUNTER — Encounter: Payer: Self-pay | Admitting: Family Medicine

## 2017-07-15 VITALS — BP 112/81 | HR 85 | Temp 98.3°F | Resp 16 | Ht 64.0 in | Wt 160.5 lb

## 2017-07-15 DIAGNOSIS — K219 Gastro-esophageal reflux disease without esophagitis: Secondary | ICD-10-CM | POA: Diagnosis not present

## 2017-07-15 MED ORDER — PANTOPRAZOLE SODIUM 40 MG PO TBEC: 40.0000 mg | DELAYED_RELEASE_TABLET | Freq: Every day | ORAL | 3 refills | Status: DC

## 2017-07-15 NOTE — Patient Instructions (Addendum)
Take your pantoprazole every morning before eating. For the next 7d, take over the counter, generic zantac 150mg  after supper.  Gastroesophageal Reflux Disease, Adult Normally, food travels down the esophagus and stays in the stomach to be digested. However, when a person has gastroesophageal reflux disease (GERD), food and stomach acid move back up into the esophagus. When this happens, the esophagus becomes sore and inflamed. Over time, GERD can create small holes (ulcers) in the lining of the esophagus. What are the causes? This condition is caused by a problem with the muscle between the esophagus and the stomach (lower esophageal sphincter, or LES). Normally, the LES muscle closes after food passes through the esophagus to the stomach. When the LES is weakened or abnormal, it does not close properly, and that allows food and stomach acid to go back up into the esophagus. The LES can be weakened by certain dietary substances, medicines, and medical conditions, including:  Tobacco use.  Pregnancy.  Having a hiatal hernia.  Heavy alcohol use.  Certain foods and beverages, such as coffee, chocolate, onions, and peppermint.  What increases the risk? This condition is more likely to develop in:  People who have an increased body weight.  People who have connective tissue disorders.  People who use NSAID medicines.  What are the signs or symptoms? Symptoms of this condition include:  Heartburn.  Difficult or painful swallowing.  The feeling of having a lump in the throat.  Abitter taste in the mouth.  Bad breath.  Having a large amount of saliva.  Having an upset or bloated stomach.  Belching.  Chest pain.  Shortness of breath or wheezing.  Ongoing (chronic) cough or a night-time cough.  Wearing away of tooth enamel.  Weight loss.  Different conditions can cause chest pain. Make sure to see your health care provider if you experience chest pain. How is this  diagnosed? Your health care provider will take a medical history and perform a physical exam. To determine if you have mild or severe GERD, your health care provider may also monitor how you respond to treatment. You may also have other tests, including:  An endoscopy toexamine your stomach and esophagus with a small camera.  A test thatmeasures the acidity level in your esophagus.  A test thatmeasures how much pressure is on your esophagus.  A barium swallow or modified barium swallow to show the shape, size, and functioning of your esophagus.  How is this treated? The goal of treatment is to help relieve your symptoms and to prevent complications. Treatment for this condition may vary depending on how severe your symptoms are. Your health care provider may recommend:  Changes to your diet.  Medicine.  Surgery.  Follow these instructions at home: Diet  Follow a diet as recommended by your health care provider. This may involve avoiding foods and drinks such as: ? Coffee and tea (with or without caffeine). ? Drinks that containalcohol. ? Energy drinks and sports drinks. ? Carbonated drinks or sodas. ? Chocolate and cocoa. ? Peppermint and mint flavorings. ? Garlic and onions. ? Horseradish. ? Spicy and acidic foods, including peppers, chili powder, curry powder, vinegar, hot sauces, and barbecue sauce. ? Citrus fruit juices and citrus fruits, such as oranges, lemons, and limes. ? Tomato-based foods, such as red sauce, chili, salsa, and pizza with red sauce. ? Fried and fatty foods, such as donuts, french fries, potato chips, and high-fat dressings. ? High-fat meats, such as hot dogs and fatty cuts of  red and white meats, such as rib eye steak, sausage, ham, and bacon. ? High-fat dairy items, such as whole milk, butter, and cream cheese.  Eat small, frequent meals instead of large meals.  Avoid drinking large amounts of liquid with your meals.  Avoid eating meals during  the 2-3 hours before bedtime.  Avoid lying down right after you eat.  Do not exercise right after you eat. General instructions  Pay attention to any changes in your symptoms.  Take over-the-counter and prescription medicines only as told by your health care provider. Do not take aspirin, ibuprofen, or other NSAIDs unless your health care provider told you to do so.  Do not use any tobacco products, including cigarettes, chewing tobacco, and e-cigarettes. If you need help quitting, ask your health care provider.  Wear loose-fitting clothing. Do not wear anything tight around your waist that causes pressure on your abdomen.  Raise (elevate) the head of your bed 6 inches (15cm).  Try to reduce your stress, such as with yoga or meditation. If you need help reducing stress, ask your health care provider.  If you are overweight, reduce your weight to an amount that is healthy for you. Ask your health care provider for guidance about a safe weight loss goal.  Keep all follow-up visits as told by your health care provider. This is important. Contact a health care provider if:  You have new symptoms.  You have unexplained weight loss.  You have difficulty swallowing, or it hurts to swallow.  You have wheezing or a persistent cough.  Your symptoms do not improve with treatment.  You have a hoarse voice. Get help right away if:  You have pain in your arms, neck, jaw, teeth, or back.  You feel sweaty, dizzy, or light-headed.  You have chest pain or shortness of breath.  You vomit and your vomit looks like blood or coffee grounds.  You faint.  Your stool is bloody or black.  You cannot swallow, drink, or eat. This information is not intended to replace advice given to you by your health care provider. Make sure you discuss any questions you have with your health care provider. Document Released: 08/04/2005 Document Revised: 03/24/2016 Document Reviewed: 02/19/2015 Elsevier  Interactive Patient Education  2017 [employer].

## 2017-07-15 NOTE — Addendum Note (Signed)
Addended by: Tammi Sou on: 07/15/2017 10:39 AM   Modules accepted: Orders

## 2017-07-15 NOTE — Progress Notes (Signed)
OFFICE VISIT  07/15/2017   CC:  Chief Complaint  Patient presents with  . Gastroesophageal Reflux     HPI:    Patient is a 66 y.o.  female who presents for "acid reflux". Onset about 3 weeks ago of pressure in chest and back, throat raw, some dry cough, lost of belching lately, frequent feeling of lump in throat when swallows, hoarseness.   These sx's are NOT related to exertion.  Always occurs at rest.  Feels like heart racing. At onset, she took prilosec OTC x 2 weeks and this did help things some.  EKG done by her daughter who is a NP was normal except rare PVC.   Since stopping the prilosec her sx's have returned with same intensity. No dysphagia or odynophagia. Had significant similar problem in 2009.  Appetite is good. She has not made GER diet changes yet. She currently has not elevated the head of her bed.  She has made appt with GI for possible EGD (08/02/17).  Past Medical History:  Diagnosis Date  . Bone spur of right foot   . Chicken pox   . DDD (degenerative disc disease), cervical   . GERD (gastroesophageal reflux disease)   . Heart palpitations   . History of nuclear stress test 03/2013   stress myoview; normal study; SVT (HR 200) - adenosine  . 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 Mitral valve prolapse    a. 03/2013 Echo: EF 55-60%, mild MVP, mild to mod MR.  . Osteopenia 2017  . Scoliosis   . SVT (supraventricular tachycardia) ([place])    a. 03/2013 occurred during ex MV-->resolved with adenosine and carotid massage.    Past Surgical History:  Procedure Laterality Date  . ABDOMINAL HYSTERECTOMY  1989   partial; pt has both ovaries  . COLONOSCOPY  01/12/2011  . DIAGNOSTIC MAMMOGRAM  08/06/2014   digital  . TRANSTHORACIC ECHOCARDIOGRAM  03/2013   EF 55-60%, mild conc hypertrophy; mild MVP, mild-mod MR    Outpatient Medications Prior to Visit  Medication Sig Dispense Refill  . CALCIUM PO Take by mouth daily.    . cholecalciferol (VITAMIN D) 400 units TABS tablet  Take 1,200 Units by mouth daily.    . Multiple Vitamins-Minerals (CENTRUM PO) Take by mouth daily.    . metoprolol succinate (TOPROL-XL) 25 MG 24 hr tablet Take 0.5 tablets (12.5 mg total) by mouth daily. (Patient not taking: Reported on 10/08/2016) 45 tablet 0  . amoxicillin-clavulanate (AUGMENTIN) 875-125 MG tablet Take 1 tablet by mouth 2 (two) times daily. (Patient not taking: Reported on 07/15/2017) 20 tablet 0   No facility-administered medications prior to visit.     No Known Allergies  ROS As per HPI  PE: Blood pressure 112/81, pulse 85, temperature 98.3 F (36.8 C), temperature source Oral, resp. rate 16, height 5\' 4"  (1.626 m), weight 160 lb 8 oz (72.8 kg), SpO2 97 %. Pt examined with Jacklynn Ganong, CMA, as chaperone.  Gen: Alert, well appearing.  Patient is oriented to person, place, time, and situation. AFFECT: pleasant, lucid thought and speech. HEN:IDPO: no injection, icteris, swelling, or exudate.  EOMI, PERRLA. Mouth: lips without lesion/swelling.  Oral mucosa pink and moist. Oropharynx without erythema, exudate, or swelling.  Neck - No masses or thyromegaly or limitation in range of motion CV: RRR, no m/r/g.   LUNGS: CTA bilat, nonlabored resps, good aeration in all lung fields. ABD: mild sub xyphoid region TTP.  LABS:  none  IMPRESSION AND PLAN:  GERD: restart  pantoprazole 40mg  qAM, and for the next 1 week take otc zantac 150 mg qhs. GERD diet/behav changes handout given/discussed today. Keep GI appt scheduled for later this month.  An After Visit Summary was printed and given to the patient.  FOLLOW UP: Return if symptoms worsen or fail to improve.  Signed:  Crissie Sickles, MD           07/15/2017

## 2017-08-01 ENCOUNTER — Ambulatory Visit
Admission: RE | Admit: 2017-08-01 | Discharge: 2017-08-01 | Disposition: A | Discharge status: Home / Self Care | Payer: PPO | Source: Ambulatory Visit | Attending: Obstetrics & Gynecology | Admitting: Obstetrics & Gynecology

## 2017-08-01 DIAGNOSIS — Z1231 Encounter for screening mammogram for malignant neoplasm of breast: Secondary | ICD-10-CM

## 2017-08-02 ENCOUNTER — Ambulatory Visit (INDEPENDENT_AMBULATORY_CARE_PROVIDER_SITE_OTHER): Payer: PPO | Admitting: Physician Assistant

## 2017-08-02 ENCOUNTER — Encounter: Payer: Self-pay | Admitting: Physician Assistant

## 2017-08-02 ENCOUNTER — Ambulatory Visit: Payer: PPO | Admitting: Nurse Practitioner

## 2017-08-02 VITALS — BP 124/78 | HR 82 | Ht 64.0 in | Wt 160.0 lb

## 2017-08-02 DIAGNOSIS — R0989 Other specified symptoms and signs involving the circulatory and respiratory systems: Secondary | ICD-10-CM

## 2017-08-02 DIAGNOSIS — R059 Cough, unspecified: Secondary | ICD-10-CM

## 2017-08-02 DIAGNOSIS — F458 Other somatoform disorders: Secondary | ICD-10-CM

## 2017-08-02 DIAGNOSIS — R490 Dysphonia: Secondary | ICD-10-CM

## 2017-08-02 DIAGNOSIS — R05 Cough: Secondary | ICD-10-CM | POA: Diagnosis not present

## 2017-08-02 DIAGNOSIS — K219 Gastro-esophageal reflux disease without esophagitis: Secondary | ICD-10-CM

## 2017-08-02 NOTE — Progress Notes (Addendum)
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      Patient was previously followed in our clinic with Dr. Olevia Perches for her colonoscopies. Last colonoscopy was in 2012 with recommendations for repeat in 10 years.    Today, the patient presents to clinic and explains that since 2009 she has had "attacks" of reflux. She tells me that typically these consist of some chest pressure and it feels as though her heart is racing. She typically starts over-the-counter Omeprazole and this goes away. Patient has also had a feeling of a "lump in my throat" since 2009 which has gradually been getting worse. She describes chronic hoarseness and a cough and tells me that she can no longer sing at church anymore. Patient has been off and on of PPIs over the past many years which typically help her symptoms. Most recently, in the past month, patient started with symptoms again and started over-the-counter Prilosec. She did this for 2 weeks with continued symptoms. She went to her primary care provider and he started her on Protonix 40 mg once daily as well as Zantac at night. Patient tells me she took the Zantac for 3 nights in a row but developed a lower back "bone pain", which she thinks was related, so she stopped her Zantac. She has continued with her Pantoprazole 40 mg daily and tells me that her symptoms are much better. She continues to "cough up a lot of mucus" and have a globus sensation.   Patient's social history is positive for having a daughter who iceskates in [place]. She is going there in November to see her and stay for a few months as they are expectingtheir first baby.    Patient denies fever, chills, blood in her stool, melena, weight loss, nausea, vomiting, dysphagia or symptoms that awaken her at night.  Past Medical  History:  Diagnosis Date  . Bone spur of right foot   . Chicken pox   . DDD (degenerative disc disease), cervical   . GERD (gastroesophageal reflux disease)   . Heart palpitations   . History of nuclear stress test 03/2013   stress myoview; normal study; SVT (HR 200) - adenosine  . 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 Mitral valve prolapse    a. 03/2013 Echo: EF 55-60%, mild MVP, mild to mod MR.  . Osteopenia 2017  . Scoliosis   . SVT (supraventricular tachycardia) ([place])    a. 03/2013 occurred during ex MV-->resolved with adenosine and carotid massage.    Past Surgical History:  Procedure Laterality Date  . COLONOSCOPY  01/12/2011  . DIAGNOSTIC MAMMOGRAM  08/06/2014   digital  . TRANSTHORACIC ECHOCARDIOGRAM  03/2013   EF 55-60%, mild conc hypertrophy; mild MVP, mild-mod MR  . VAGINAL HYSTERECTOMY  1989   partial; pt has both ovaries    Current Outpatient Prescriptions  Medication Sig Dispense Refill  . CALCIUM PO Take by mouth daily.    . cholecalciferol (VITAMIN D) 400 units TABS tablet Take 1,200 Units by mouth daily.    . Multiple Vitamins-Minerals (CENTRUM PO) Take by mouth daily.    . pantoprazole (PROTONIX) 40 MG tablet Take 1 tablet (40 mg total) by mouth daily. 30 tablet 3  . metoprolol succinate (TOPROL-XL) 25 MG 24 hr tablet Take 0.5 tablets (12.5 mg total)  by mouth daily. (Patient not taking: Reported on 08/02/2017) 45 tablet 0   No current facility-administered medications for this visit.     Allergies as of 08/02/2017 - Review Complete 08/02/2017  Allergen Reaction Noted  . Hydrocodone-acetaminophen Other (See Comments) 08/02/2017    Family History  Problem Relation Age of Onset  . Heart attack Mother   . Hypertension Mother        Alzheimer's  . Stroke Mother        Afib  . Breast cancer Mother   . Osteoporosis Mother        cervical/femur fracture  . Myasthenia gravis Father   . Prostate cancer Father   . Prostate cancer Brother   . Arthritis Maternal Aunt    . Arthritis Maternal Uncle   . Prostate cancer Maternal Uncle   . Early death Maternal Grandmother        died after 2023/02/05 baby  . Heart disease Maternal Grandfather   . Congestive Heart Failure Maternal Grandfather   . Ovarian cancer Paternal Grandmother   . Other Daughter        mitral valve damaged had to be repaired, cause unknown  . Colon cancer Neg Hx   . Esophageal cancer Neg Hx   . Rectal cancer Neg Hx     Social History   Social History  . Marital status: Married    Spouse name: Richardson Landry  . Number of children: 5  . Years of education: 25   Occupational History  . retired Therapist, sports    Social History Main Topics  . Smoking status: Never Smoker  . Smokeless tobacco: Never Used  . Alcohol use No  . Drug use: No  . Sexual activity: Yes    Partners: Male     Comment: married   Other Topics Concern  . Not on file   Social History Narrative   Married to [place], they have 5 children.    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   Drinks caffeine, takes a daily vitamin   Wears seatbelt, smoke detector in the home, no firearms in the home.    Feels safe in her relationships.        Review of Systems:    Constitutional: No weight loss, fever or chills Skin: No rash  Cardiovascular: See HPI Respiratory: No SOB  Gastrointestinal: See HPI and otherwise negative Genitourinary: No dysuria Neurological: No headache Musculoskeletal: No new muscle or joint pain Hematologic: No bleeding Psychiatric: No history of depression or anxiety   Physical Exam:  Vital signs: BP 124/78   Pulse 82   Ht 5\' 4"  (1.626 m)   Wt 160 lb (72.6 kg)   BMI 27.46 kg/m   Constitutional:   Pleasant Caucasian female appears to be in NAD, Well developed, Well nourished, alert and cooperative Head:  Normocephalic and atraumatic. Eyes:   PEERL, EOMI. No icterus. Conjunctiva pink. Ears:  Normal auditory acuity. Neck:  Supple Throat: Oral cavity and pharynx without  inflammation, swelling or lesion.  Respiratory: Respirations even and unlabored. Lungs clear to auscultation bilaterally.   No wheezes, crackles, or rhonchi.  Cardiovascular: Normal S1, S2. No MRG. Regular rate and rhythm. No peripheral edema, cyanosis or pallor.  Gastrointestinal:  Soft, nondistended, mild epigastric ttp. No rebound or guarding. Normal bowel sounds. No appreciable masses or hepatomegaly. Rectal:  Not performed.  Msk:  Symmetrical without gross deformities. Without edema, no deformity or joint abnormality.  Neurologic:  Alert and  oriented x4;  grossly normal neurologically.  Skin:   Dry and intact without significant lesions or rashes. Psychiatric:  Demonstrates good judgement and reason without abnormal affect or behaviors.  No recent labs or imaging.  Assessment: 1. GERD: Off-and-on over the past 9 years, typically relieved with over-the-counter Omeprazole for 2 weeks, recently this did not work and patient was started on Pantoprazole 40 mg daily with some relief 2. Globus sensation: Chronic over the past 9 years, seems to be worsening, associated with coughing and hoarseness  Plan: 1. Continue Pantoprazole 40 mg every morning 2. Reviewed antireflux diet and lifestyle modifications 3. Scheduled patient for an EGD in the [place] with Dr. Hilarie Fredrickson. Did discuss risks, benefits, limitations and alternatives the patient agrees to proceed. 4. Patient to follow in clinic per recommendations from Dr. Hilarie Fredrickson after time of procedure.  Ellouise Newer, PA-C [facility] 08/02/2017, 2:10 PM  Cc: Ma Hillock, DO   Addendum: Reviewed and agree with initial management. Pyrtle, Lajuan Lines, MD

## 2017-08-02 NOTE — Patient Instructions (Signed)
Continue Pantoprazole 40 mg daily 30-60 mins before breakfast  You have been scheduled for an endoscopy. Please follow written instructions given to you at your visit today. If you use inhalers (even only as needed), please bring them with you on the day of your procedure. Your physician has requested that you go to www.startemmi.com and enter the access code given to you at your visit today. This web site gives a general overview about your procedure. However, you should still follow specific instructions given to you by our office regarding your preparation for the procedure.

## 2017-08-03 ENCOUNTER — Encounter: Payer: Self-pay | Admitting: Internal Medicine

## 2017-08-09 ENCOUNTER — Ambulatory Visit (AMBULATORY_SURGERY_CENTER): Payer: PPO | Admitting: Internal Medicine

## 2017-08-09 ENCOUNTER — Encounter: Payer: Self-pay | Admitting: Internal Medicine

## 2017-08-09 VITALS — BP 124/63 | HR 68 | Temp 98.9°F | Resp 20 | Ht 64.0 in | Wt 160.0 lb

## 2017-08-09 DIAGNOSIS — I471 Supraventricular tachycardia: Secondary | ICD-10-CM | POA: Diagnosis not present

## 2017-08-09 DIAGNOSIS — K219 Gastro-esophageal reflux disease without esophagitis: Secondary | ICD-10-CM | POA: Diagnosis not present

## 2017-08-09 DIAGNOSIS — R05 Cough: Secondary | ICD-10-CM | POA: Diagnosis not present

## 2017-08-09 MED ORDER — SODIUM CHLORIDE 0.9 % IV SOLN: 500.0000 mL | INTRAVENOUS | Status: DC

## 2017-08-09 NOTE — Progress Notes (Signed)
Report given to PACU, vss 

## 2017-08-09 NOTE — Progress Notes (Signed)
Called to room to assist during endoscopic procedure.  Patient ID and intended procedure confirmed with present staff. Received instructions for my participation in the procedure from the performing physician.  

## 2017-08-09 NOTE — Patient Instructions (Signed)
YOU HAD AN ENDOSCOPIC PROCEDURE TODAY AT THE [facility]:   Refer to the procedure report that was given to you for any specific questions about what was found during the examination.  If the procedure report does not answer your questions, please call your gastroenterologist to clarify.  If you requested that your care partner not be given the details of your procedure findings, then the procedure report has been included in a sealed envelope for you to review at your convenience later.  YOU SHOULD EXPECT: Some feelings of bloating in the abdomen. Passage of more gas than usual.  Walking can help get rid of the air that was put into your GI tract during the procedure and reduce the bloating. If you had a lower endoscopy (such as a colonoscopy or flexible sigmoidoscopy) you may notice spotting of blood in your stool or on the toilet paper. If you underwent a bowel prep for your procedure, you may not have a normal bowel movement for a few days.  Please Note:  You might notice some irritation and congestion in your nose or some drainage.  This is from the oxygen used during your procedure.  There is no need for concern and it should clear up in a day or so.  SYMPTOMS TO REPORT IMMEDIATELY:    Following upper endoscopy (EGD)  Vomiting of blood or coffee ground material  New chest pain or pain under the shoulder blades  Painful or persistently difficult swallowing  New shortness of breath  Fever of 100F or higher  Black, tarry-looking stools  For urgent or emergent issues, a gastroenterologist can be reached at any hour by calling [phone].   DIET:  We do recommend a small meal at first, but then you may proceed to your regular diet.  Drink plenty of fluids but you should avoid alcoholic beverages for 24 hours.  ACTIVITY:  You should plan to take it easy for the rest of today and you should NOT DRIVE or use heavy machinery until tomorrow (because of the sedation medicines used  during the test).    FOLLOW UP: Our staff will call the number listed on your records the next business day following your procedure to check on you and address any questions or concerns that you may have regarding the information given to you following your procedure. If we do not reach you, we will leave a message.  However, if you are feeling well and you are not experiencing any problems, there is no need to return our call.  We will assume that you have returned to your regular daily activities without incident.  If any biopsies were taken you will be contacted by phone or by letter within the next 1-3 weeks.  Please call us at [phone] if you have not heard about the biopsies in 3 weeks.    SIGNATURES/CONFIDENTIALITY: You and/or your care partner have signed paperwork which will be entered into your electronic medical record.  These signatures attest to the fact that that the information above on your After Visit Summary has been reviewed and is understood.  Full responsibility of the confidentiality of this discharge information lies with you and/or your care-partner.  Hiatal hernia information given. 

## 2017-08-09 NOTE — Op Note (Signed)
[place] Patient Name: Jenny Hicks Procedure Date: 08/09/2017 7:45 AM MRN: [id number] Endoscopist: Jerene Bears , MD Age: 66 Referring MD:  Date of Birth: 18-Dec-1950 Gender: Female Account #: [id number] Procedure:                Upper GI endoscopy Indications:              Gastro-esophageal reflux disease, Chest pain (non                            cardiac), Globus sensation Medicines:                Monitored Anesthesia Care Procedure:                Pre-Anesthesia Assessment:                           - Prior to the procedure, a History and Physical                            was performed, and patient medications and                            allergies were reviewed. The patient's tolerance of                            previous anesthesia was also reviewed. The risks                            and benefits of the procedure and the sedation                            options and risks were discussed with the patient.                            All questions were answered, and informed consent                            was obtained. Prior Anticoagulants: The patient has                            taken no previous anticoagulant or antiplatelet                            agents. ASA Grade Assessment: II - A patient with                            mild systemic disease. After reviewing the risks                            and benefits, the patient was deemed in                            satisfactory condition to undergo the procedure.  After obtaining informed consent, the endoscope was                            passed under direct vision. Throughout the                            procedure, the patient's blood pressure, pulse, and                            oxygen saturations were monitored continuously. The                            Endoscope was introduced through the mouth, and                            advanced to the second part of  duodenum. The upper                            GI endoscopy was accomplished without difficulty.                            The patient tolerated the procedure well. Scope In: Scope Out: Findings:                 Normal mucosa was found in the entire esophagus.                            Biopsies were obtained from the proximal and distal                            esophagus with cold forceps for histology of                            suspected eosinophilic esophagitis.                           A small, 1 cm, hiatal hernia was present.                           Multiple small sessile polyps were found in the                            cardia, in the gastric fundus and in the gastric                            body. These are benign-appearing and with the                            typical appearance of fundic gland polyps.                           The exam of the stomach was otherwise normal.  The examined duodenum was normal. Complications:            No immediate complications. Estimated Blood Loss:     Estimated blood loss was minimal. Impression:               - Normal mucosa was found in the entire esophagus.                            Biopsied.                           - Small hiatal hernia.                           - Multiple, benign, gastric polyps.                           - Otherwise normal stomach mucosa.                           - Normal examined duodenum. Recommendation:           - Patient has a contact number available for                            emergencies. The signs and symptoms of potential                            delayed complications were discussed with the                            patient. Return to normal activities tomorrow.                            Written discharge instructions were provided to the                            patient.                           - Resume previous diet.                           - Continue  present medications. Would continue                            pantoprazole 40 mg daily x 4-6 weeks before                            assessing response. If globus sensation continue                            can consider further evaluation (ENT evaluation, CT                            neck). Also, if further episodes of atypical chest  pain on PPI, then would recommend esophageal                            manometry to evaluate for esophageal spasm.                           - Await pathology results. Jerene Bears, MD 08/09/2017 8:35:18 AM This report has been signed electronically.

## 2017-08-10 ENCOUNTER — Telehealth: Payer: Self-pay | Admitting: *Deleted

## 2017-08-10 NOTE — Telephone Encounter (Signed)
No answer, left message to call if questions or concerns. 

## 2017-08-18 ENCOUNTER — Encounter: Payer: Self-pay | Admitting: Internal Medicine

## 2017-09-01 ENCOUNTER — Ambulatory Visit: Payer: PPO | Admitting: Gastroenterology

## 2017-12-13 ENCOUNTER — Encounter: Payer: PPO | Admitting: Family Medicine

## 2017-12-15 ENCOUNTER — Encounter: Payer: Self-pay | Admitting: Family Medicine

## 2017-12-15 ENCOUNTER — Ambulatory Visit (INDEPENDENT_AMBULATORY_CARE_PROVIDER_SITE_OTHER): Payer: PPO | Admitting: Family Medicine

## 2017-12-15 VITALS — BP 111/73 | HR 73 | Temp 97.8°F | Ht 64.0 in | Wt 163.8 lb

## 2017-12-15 DIAGNOSIS — N644 Mastodynia: Secondary | ICD-10-CM | POA: Diagnosis not present

## 2017-12-15 DIAGNOSIS — Z130 Encounter for screening for diseases of the blood and blood-forming organs and certain disorders involving the immune mechanism: Secondary | ICD-10-CM

## 2017-12-15 DIAGNOSIS — E663 Overweight: Secondary | ICD-10-CM | POA: Diagnosis not present

## 2017-12-15 DIAGNOSIS — E785 Hyperlipidemia, unspecified: Secondary | ICD-10-CM

## 2017-12-15 DIAGNOSIS — Z0000 Encounter for general adult medical examination without abnormal findings: Secondary | ICD-10-CM

## 2017-12-15 DIAGNOSIS — E559 Vitamin D deficiency, unspecified: Secondary | ICD-10-CM | POA: Insufficient documentation

## 2017-12-15 DIAGNOSIS — M8580 Other specified disorders of bone density and structure, unspecified site: Secondary | ICD-10-CM | POA: Diagnosis not present

## 2017-12-15 DIAGNOSIS — Z0001 Encounter for general adult medical examination with abnormal findings: Secondary | ICD-10-CM

## 2017-12-15 DIAGNOSIS — M7989 Other specified soft tissue disorders: Secondary | ICD-10-CM

## 2017-12-15 DIAGNOSIS — M799 Soft tissue disorder, unspecified: Secondary | ICD-10-CM

## 2017-12-15 DIAGNOSIS — Z23 Encounter for immunization: Secondary | ICD-10-CM | POA: Diagnosis not present

## 2017-12-15 LAB — COMPREHENSIVE METABOLIC PANEL
ALBUMIN: 4 g/dL (ref 3.5–5.2)
ALT: 21 U/L (ref 0–35)
AST: 17 U/L (ref 0–37)
Alkaline Phosphatase: 55 U/L (ref 39–117)
BILIRUBIN TOTAL: 0.7 mg/dL (ref 0.2–1.2)
BUN: 16 mg/dL (ref 6–23)
CALCIUM: 9.1 mg/dL (ref 8.4–10.5)
CHLORIDE: 104 meq/L (ref 96–112)
CO2: 32 mEq/L (ref 19–32)
CREATININE: 0.65 mg/dL (ref 0.40–1.20)
GFR: 96.76 mL/min (ref >60.00)
Glucose, Bld: 89 mg/dL (ref 70–99)
Potassium: 4.4 mEq/L (ref 3.5–5.1)
Sodium: 142 mEq/L (ref 135–145)
Total Protein: 6.7 g/dL (ref 6.0–8.3)

## 2017-12-15 LAB — CBC WITH DIFFERENTIAL/PLATELET
BASOS PCT: 0.7 % (ref 0.0–3.0)
Basophils Absolute: 0 10*3/uL (ref 0.0–0.1)
EOS ABS: 0.1 10*3/uL (ref 0.0–0.7)
Eosinophils Relative: 1.5 % (ref 0.0–5.0)
HEMATOCRIT: 42.2 % (ref 36.0–46.0)
HEMOGLOBIN: 13.9 g/dL (ref 12.0–15.0)
LYMPHS PCT: 30.9 % (ref 12.0–46.0)
Lymphs Abs: 2 10*3/uL (ref 0.7–4.0)
MCHC: 32.9 g/dL (ref 30.0–36.0)
MCV: 92.5 fl (ref 78.0–100.0)
MONOS PCT: 6.5 % (ref 3.0–12.0)
Monocytes Absolute: 0.4 10*3/uL (ref 0.1–1.0)
Neutro Abs: 3.8 10*3/uL (ref 1.4–7.7)
Neutrophils Relative %: 60.4 % (ref 43.0–77.0)
Platelets: 287 10*3/uL (ref 150.0–400.0)
RBC: 4.56 Mil/uL (ref 3.87–5.11)
RDW: 13.4 % (ref 11.5–15.5)
WBC: 6.4 10*3/uL (ref 4.0–10.5)

## 2017-12-15 LAB — LIPID PANEL
CHOLESTEROL: 193 mg/dL (ref 0–200)
HDL: 48.7 mg/dL (ref >39.00)
LDL Cholesterol: 113 mg/dL — ABNORMAL HIGH (ref 0–99)
NonHDL: 143.81
TRIGLYCERIDES: 156 mg/dL — AB (ref 0.0–149.0)
Total CHOL/HDL Ratio: 4
VLDL: 31.2 mg/dL (ref 0.0–40.0)

## 2017-12-15 LAB — TSH: TSH: 2.72 u[IU]/mL (ref 0.35–4.50)

## 2017-12-15 LAB — VITAMIN D 25 HYDROXY (VIT D DEFICIENCY, FRACTURES): VITD: 31.58 ng/mL (ref 30.00–100.00)

## 2017-12-15 LAB — HEMOGLOBIN A1C: Hgb A1c MFr Bld: 5.6 % (ref 4.6–6.5)

## 2017-12-15 NOTE — Progress Notes (Signed)
Patient ID: Jenny Hicks, female  DOB: Feb 24, 1951, 67 y.o.   MRN: [id number] Patient Care Team    Relationship Specialty Notifications Start End  Ma Hillock, DO PCP - General Family Medicine  09/16/16   Pixie Casino, MD Consulting Physician Cardiology  09/16/16   Vania Rea, MD Consulting Physician Obstetrics and Gynecology  09/16/16     Subjective:  Jenny Hicks is a 67 y.o.  Female  present for CPE. All past medical history, surgical history, allergies, family history, immunizations, medications and social history were updated  in the electronic medical record today. All recent labs, ED visits and hospitalizations within the last year were reviewed.  Breast pain: Patient reports she has had left breast pain since the end of September 2018. Mammogram completed just prior to onset of symptoms in September 2018. Patient reports discomfort left superior and lateral breast. She also reports discomfort under her breast at her bra line and she feels her rib cage sticks out further at that location. She denies any skin changes or nipple discharge. She has a family history of breast cancer present in her mother.  Health maintenance: updated 12/15/2017 Colonoscopy: completed 01/2011, by Dr. Olevia Perches, resutls inflammatory polyp. follow up 10 years. Mammogram: completed:07/2017, birads 1. FHx present in mother (prostate and ovarian cancers also in many members of family on paternal side) Cervical cancer screening: Hysterectomy 1989 (non-cancerous reasons), routinely follows with Dr. Stann Mainland (GYN) 09/2015.  Immunizations: td provided today, Influenza UTD 2018 (encouraged yearly), PNA series completed today.  zostavax completed.  Infectious disease screening:Hep C and HIV completed DEXA: 06/2016, Osteopenia (-1.9), ca/vit d (1000u). Repeat 2-3 years. Declines prescribed med. Ordered placed today to have completed 06/2018. Assistive device: None Oxygen use: none Patient has a Dental  home. Hospitalizations/ED visits: none  Immunization History  Administered Date(s) Administered  . Influenza Split 08/26/2011, 08/28/2012  . Influenza-Unspecified 08/24/2014, 09/01/2015, 07/25/2016, 08/22/2017  . Pneumococcal Conjugate-13 10/08/2016  . Pneumococcal Polysaccharide-23 03/15/2013, 12/15/2017  . Td 12/15/2017  . Tdap 11/09/2007  . Zoster 11/06/2011     Past Medical History:  Diagnosis Date  . Bone spur of right foot   . Chicken pox   . DDD (degenerative disc disease), cervical   . GERD (gastroesophageal reflux disease)   . Heart palpitations   . History of nuclear stress test 03/2013   stress myoview; normal study; SVT (HR 200) - adenosine  . 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 Mitral valve prolapse    a. 03/2013 Echo: EF 55-60%, mild MVP, mild to mod MR.  . Osteopenia 2017  . Scoliosis   . SVT (supraventricular tachycardia) ([place])    a. 03/2013 occurred during ex MV-->resolved with adenosine and carotid massage.   Allergies  Allergen Reactions  . Hydrocodone-Acetaminophen Other (See Comments)    Made her BP go way up   Past Surgical History:  Procedure Laterality Date  . COLONOSCOPY  01/12/2011  . DIAGNOSTIC MAMMOGRAM  08/06/2014   digital  . TRANSTHORACIC ECHOCARDIOGRAM  03/2013   EF 55-60%, mild conc hypertrophy; mild MVP, mild-mod MR  . VAGINAL HYSTERECTOMY  1989   partial; pt has both ovaries   Family History  Problem Relation Age of Onset  . Heart attack Mother   . Hypertension Mother        Alzheimer's  . Stroke Mother        Afib  . Breast cancer Mother   . Osteoporosis Mother  cervical/femur fracture  . Myasthenia gravis Father   . Prostate cancer Father   . Prostate cancer Brother   . Arthritis Maternal Aunt   . Arthritis Maternal Uncle   . Prostate cancer Maternal Uncle   . Early death Maternal Grandmother        died after 2023/01/27 baby  . Heart disease Maternal Grandfather   . Congestive Heart Failure Maternal Grandfather   . Ovarian  cancer Paternal Grandmother   . Other Daughter        mitral valve damaged had to be repaired, cause unknown  . Colon cancer Neg Hx   . Esophageal cancer Neg Hx   . Rectal cancer Neg Hx    Social History   Socioeconomic History  . Marital status: Married    Spouse name: Richardson Landry  . Number of children: 5  . Years of education: 66  . Highest education level: Not on file  Social Needs  . Financial resource strain: Not on file  . Food insecurity - worry: Not on file  . Food insecurity - inability: Not on file  . Transportation needs - medical: Not on file  . Transportation needs - non-medical: Not on file  Occupational History  . Occupation: retired Therapist, sports  Tobacco Use  . Smoking status: Never Smoker  . Smokeless tobacco: Never Used  Substance and Sexual Activity  . Alcohol use: No  . Drug use: No  . Sexual activity: Yes    Partners: Male    Comment: married  Other Topics Concern  . Not on file  Social History Narrative   Married to [place], they have 5 children.    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   Drinks caffeine, takes a daily vitamin   Wears seatbelt, smoke detector in the home, no firearms in the home.    Feels safe in her relationships.    Allergies as of 12/15/2017      Reactions   Hydrocodone-acetaminophen Other (See Comments)   Made her BP go way up      Medication List        Accurate as of 12/15/17  4:16 PM. Always use your most recent med list.          CENTRUM PO Take by mouth daily.   cholecalciferol 400 units Tabs tablet Commonly known as:  VITAMIN D Take 1,200 Units by mouth daily.        No results found for this or any previous visit (from the past 2160 hour(s)).      ROS: 14 pt review of systems performed and negative (unless mentioned in an HPI)  Objective: BP 111/73 (BP Location: Left Arm, Patient Position: Sitting, Cuff Size: Normal)   Pulse 73   Temp 97.8 F (36.6 C) (Oral)   Ht 5\' 4"  (1.626 m)    Wt 163 lb 12.8 oz (74.3 kg)   SpO2 95%   BMI 28.12 kg/m  Gen: Afebrile. No acute distress. Nontoxic in appearance, well-developed, well-nourished, very pleasant Caucasian female. HENT: AT. . Bilateral TM visualized and normal in appearance. MMM. Bilateral nares without erythema or drainage. Throat without erythema or exudates. No cough. No hoarseness. Eyes:Pupils Equal Round Reactive to light, Extraocular movements intact,  Conjunctiva without redness, discharge or icterus. Neck/lymp/endocrine: Supple, no lymphadenopathy, no thyromegaly CV: RRR no murmur, no edema, +2/4 P posterior tibialis pulses Chest: CTAB, no wheeze or crackles Abd: Soft. Flat. NTND. BS present. No Masses palpated.  Skin: No rashes, purpura or petechiae.  Neuro: Normal gait. PERLA. EOMi. Alert. Oriented x3. Cranial nerves II through XII intact. Muscle strength 5/5 bilateral extremity. DTRs equal bilaterally. Psych: Normal affect, dress and demeanor. Normal speech. Normal thought content and judgment. Breasts: breasts appear normal, symmetrical, no skin or nipple changes or axillary nodes. Moderate tenderness superior left breast above the areola. Moderate tenderness lateral breast towards axilla. Soft tissue mass palpated with tenderness left anterior rib 7/rib cage brim. Assessment/plan: Jenny Hicks is a 67 y.o. female present for CPE. Encounter for preventive health examination Patient was encouraged to exercise greater than 150 minutes a week. Patient was encouraged to choose a diet filled with fresh fruits and vegetables, and lean meats. AVS provided to patient today for education/recommendation on gender specific health and safety maintenance. Colonoscopy: completed 01/2011, by Dr. Olevia Perches, resutls inflammatory polyp. follow up 10 years. Mammogram: completed:07/2017, birads 1. FHx present in mother (prostate and ovarian cancers also in many members of family on paternal side) Cervical cancer screening: Hysterectomy  1989 (non-cancerous reasons), routinely follows with Dr. Stann Mainland (GYN) 09/2015.  Immunizations: td provided today, Influenza UTD 2018 (encouraged yearly), PNA series completed today.  zostavax completed.  Infectious disease screening:Hep C and HIV completed DEXA: 06/2016, Osteopenia (-1.9), ca/vit d (1000u). Repeat 2-3 years. Declines prescribed med. Ordered placed today to have completed 06/2018. Osteopenia:  - Repeat bone density ordered for August 2019. Patient has declined medication for now. She is taking vitamin D supplementation and weightbearing exercise. Vitamin D deficiency - taking supplement.  - VITAMIN D 25 Hydroxy (Vit-D Deficiency, Fractures) Overweight (BMI 25.0-29.9) Diet and exercise - HgB A1c - TSH Screening for deficiency anemia - CBC w/Diff Hyperlipidemia, unspecified hyperlipidemia type - h/o hyperlipidemia. She is currently not taking medications.  - FHX of heart disease, MI, stroke.  - with pts h/o, fhx and age, would advised her to take a daily baby ASA if able to tolerate.  Left breast pain/soft tissue mass left anterior costal cartilage: - No discrete masses palpated within the left breast or axilla, however patient seems to be rather tender left superior and lateral aspect of breast. - Soft tissue mass is palpable over left anterior costal cartilage/rib 7. She is also tender over this area. - Diagnostic mammogram with ultrasound ordered for left breast pain. May need additional ultrasound or MRI in order to evaluate soft tissue mass of her left costal cartilage rib 7 anterior/ cage brim. Will see if they can include the ultrasound of this area with breast ultrasound first.  Return in about 1 year (around 12/15/2018) for CPE.  Electronically signed by: Howard Pouch, DO [place]

## 2017-12-15 NOTE — Patient Instructions (Addendum)
We will order your diagnostic ultrasound/mammogram of left breast to further investigate discomfort.     Health Maintenance, Female Adopting a healthy lifestyle and getting preventive care can go a long way to promote health and wellness. Talk with your health care provider about what schedule of regular examinations is right for you. This is a good chance for you to check in with your provider about disease prevention and staying healthy. In between checkups, there are plenty of things you can do on your own. Experts have done a lot of research about which lifestyle changes and preventive measures are most likely to keep you healthy. Ask your health care provider for more information. Weight and diet Eat a healthy diet  Be sure to include plenty of vegetables, fruits, low-fat dairy products, and lean protein.  Do not eat a lot of foods high in solid fats, added sugars, or salt.  Get regular exercise. This is one of the most important things you can do for your health. ? Most adults should exercise for at least 150 minutes each week. The exercise should increase your heart rate and make you sweat (moderate-intensity exercise). ? Most adults should also do strengthening exercises at least twice a week. This is in addition to the moderate-intensity exercise.  Maintain a healthy weight  Body mass index (BMI) is a measurement that can be used to identify possible weight problems. It estimates body fat based on height and weight. Your health care provider can help determine your BMI and help you achieve or maintain a healthy weight.  For females 38 years of age and older: ? A BMI below 18.5 is considered underweight. ? A BMI of 18.5 to 24.9 is normal. ? A BMI of 25 to 29.9 is considered overweight. ? A BMI of 30 and above is considered obese.  Watch levels of cholesterol and blood lipids  You should start having your blood tested for lipids and cholesterol at 67 years of age, then have this  test every 5 years.  You may need to have your cholesterol levels checked more often if: ? Your lipid or cholesterol levels are high. ? You are older than 67 years of age. ? You are at high risk for heart disease.  Cancer screening Lung Cancer  Lung cancer screening is recommended for adults 71-22 years old who are at high risk for lung cancer because of a history of smoking.  A yearly low-dose CT scan of the lungs is recommended for people who: ? Currently smoke. ? Have quit within the past 15 years. ? Have at least a 30-pack-year history of smoking. A pack year is smoking an average of one pack of cigarettes a day for 1 year.  Yearly screening should continue until it has been 15 years since you quit.  Yearly screening should stop if you develop a health problem that would prevent you from having lung cancer treatment.  Breast Cancer  Practice breast self-awareness. This means understanding how your breasts normally appear and feel.  It also means doing regular breast self-exams. Let your health care provider know about any changes, no matter how small.  If you are in your 20s or 30s, you should have a clinical breast exam (CBE) by a health care provider every 1-3 years as part of a regular health exam.  If you are 34 or older, have a CBE every year. Also consider having a breast X-ray (mammogram) every year.  If you have a family history of breast  cancer, talk to your health care provider about genetic screening.  If you are at high risk for breast cancer, talk to your health care provider about having an MRI and a mammogram every year.  Breast cancer gene (BRCA) assessment is recommended for women who have family members with BRCA-related cancers. BRCA-related cancers include: ? Breast. ? Ovarian. ? Tubal. ? Peritoneal cancers.  Results of the assessment will determine the need for genetic counseling and BRCA1 and BRCA2 testing.  Cervical Cancer Your health care  provider may recommend that you be screened regularly for cancer of the pelvic organs (ovaries, uterus, and vagina). This screening involves a pelvic examination, including checking for microscopic changes to the surface of your cervix (Pap test). You may be encouraged to have this screening done every 3 years, beginning at age 44.  For women ages 11-65, health care providers may recommend pelvic exams and Pap testing every 3 years, or they may recommend the Pap and pelvic exam, combined with testing for human papilloma virus (HPV), every 5 years. Some types of HPV increase your risk of cervical cancer. Testing for HPV may also be done on women of any age with unclear Pap test results.  Other health care providers may not recommend any screening for nonpregnant women who are considered low risk for pelvic cancer and who do not have symptoms. Ask your health care provider if a screening pelvic exam is right for you.  If you have had past treatment for cervical cancer or a condition that could lead to cancer, you need Pap tests and screening for cancer for at least 20 years after your treatment. If Pap tests have been discontinued, your risk factors (such as having a new sexual partner) need to be reassessed to determine if screening should resume. Some women have medical problems that increase the chance of getting cervical cancer. In these cases, your health care provider may recommend more frequent screening and Pap tests.  Colorectal Cancer  This type of cancer can be detected and often prevented.  Routine colorectal cancer screening usually begins at 67 years of age and continues through 67 years of age.  Your health care provider may recommend screening at an earlier age if you have risk factors for colon cancer.  Your health care provider may also recommend using home test kits to check for hidden blood in the stool.  A small camera at the end of a tube can be used to examine your colon  directly (sigmoidoscopy or colonoscopy). This is done to check for the earliest forms of colorectal cancer.  Routine screening usually begins at age 32.  Direct examination of the colon should be repeated every 5-10 years through 67 years of age. However, you may need to be screened more often if early forms of precancerous polyps or small growths are found.  Skin Cancer  Check your skin from head to toe regularly.  Tell your health care provider about any new moles or changes in moles, especially if there is a change in a mole's shape or color.  Also tell your health care provider if you have a mole that is larger than the size of a pencil eraser.  Always use sunscreen. Apply sunscreen liberally and repeatedly throughout the day.  Protect yourself by wearing long sleeves, pants, a wide-brimmed hat, and sunglasses whenever you are outside.  Heart disease, diabetes, and high blood pressure  High blood pressure causes heart disease and increases the risk of stroke. High blood pressure  is more likely to develop in: ? People who have blood pressure in the high end of the normal range (130-139/85-89 mm Hg). ? People who are overweight or obese. ? People who are African American.  If you are 32-59 years of age, have your blood pressure checked every 3-5 years. If you are 25 years of age or older, have your blood pressure checked every year. You should have your blood pressure measured twice-once when you are at a hospital or clinic, and once when you are not at a hospital or clinic. Record the average of the two measurements. To check your blood pressure when you are not at a hospital or clinic, you can use: ? An automated blood pressure machine at a pharmacy. ? A home blood pressure monitor.  If you are between 39 years and 53 years old, ask your health care provider if you should take aspirin to prevent strokes.  Have regular diabetes screenings. This involves taking a blood sample to  check your fasting blood sugar level. ? If you are at a normal weight and have a low risk for diabetes, have this test once every three years after 67 years of age. ? If you are overweight and have a high risk for diabetes, consider being tested at a younger age or more often. Preventing infection Hepatitis B  If you have a higher risk for hepatitis B, you should be screened for this virus. You are considered at high risk for hepatitis B if: ? You were born in a country where hepatitis B is common. Ask your health care provider which countries are considered high risk. ? Your parents were born in a high-risk country, and you have not been immunized against hepatitis B (hepatitis B vaccine). ? You have HIV or AIDS. ? You use needles to inject street drugs. ? You live with someone who has hepatitis B. ? You have had sex with someone who has hepatitis B. ? You get hemodialysis treatment. ? You take certain medicines for conditions, including cancer, organ transplantation, and autoimmune conditions.  Hepatitis C  Blood testing is recommended for: ? Everyone born from [birth year] through 1965. ? Anyone with known risk factors for hepatitis C.  Sexually transmitted infections (STIs)  You should be screened for sexually transmitted infections (STIs) including gonorrhea and chlamydia if: ? You are sexually active and are younger than 67 years of age. ? You are older than 67 years of age and your health care provider tells you that you are at risk for this type of infection. ? Your sexual activity has changed since you were last screened and you are at an increased risk for chlamydia or gonorrhea. Ask your health care provider if you are at risk.  If you do not have HIV, but are at risk, it may be recommended that you take a prescription medicine daily to prevent HIV infection. This is called pre-exposure prophylaxis (PrEP). You are considered at risk if: ? You are sexually active and do not regularly  use condoms or know the HIV status of your partner(s). ? You take drugs by injection. ? You are sexually active with a partner who has HIV.  Talk with your health care provider about whether you are at high risk of being infected with HIV. If you choose to begin PrEP, you should first be tested for HIV. You should then be tested every 3 months for as long as you are taking PrEP. Pregnancy  If you are premenopausal and  you may become pregnant, ask your health care provider about preconception counseling.  If you may become pregnant, take 400 to 800 micrograms (mcg) of folic acid every day.  If you want to prevent pregnancy, talk to your health care provider about birth control (contraception). Osteoporosis and menopause  Osteoporosis is a disease in which the bones lose minerals and strength with aging. This can result in serious bone fractures. Your risk for osteoporosis can be identified using a bone density scan.  If you are 26 years of age or older, or if you are at risk for osteoporosis and fractures, ask your health care provider if you should be screened.  Ask your health care provider whether you should take a calcium or vitamin D supplement to lower your risk for osteoporosis.  Menopause may have certain physical symptoms and risks.  Hormone replacement therapy may reduce some of these symptoms and risks. Talk to your health care provider about whether hormone replacement therapy is right for you. Follow these instructions at home:  Schedule regular health, dental, and eye exams.  Stay current with your immunizations.  Do not use any tobacco products including cigarettes, chewing tobacco, or electronic cigarettes.  If you are pregnant, do not drink alcohol.  If you are breastfeeding, limit how much and how often you drink alcohol.  Limit alcohol intake to no more than 1 drink per day for nonpregnant women. One drink equals 12 ounces of beer, 5 ounces of wine, or 1 ounces  of hard liquor.  Do not use street drugs.  Do not share needles.  Ask your health care provider for help if you need support or information about quitting drugs.  Tell your health care provider if you often feel depressed.  Tell your health care provider if you have ever been abused or do not feel safe at home. This information is not intended to replace advice given to you by your health care provider. Make sure you discuss any questions you have with your health care provider. Document Released: 05/10/2011 Document Revised: 04/01/2016 Document Reviewed: 07/29/2015 Elsevier Interactive Patient Education  [employer].

## 2017-12-20 DIAGNOSIS — Z6827 Body mass index (BMI) 27.0-27.9, adult: Secondary | ICD-10-CM | POA: Diagnosis not present

## 2017-12-20 DIAGNOSIS — Z01419 Encounter for gynecological examination (general) (routine) without abnormal findings: Secondary | ICD-10-CM | POA: Diagnosis not present

## 2017-12-21 ENCOUNTER — Ambulatory Visit
Admission: RE | Admit: 2017-12-21 | Discharge: 2017-12-21 | Disposition: A | Discharge status: Home / Self Care | Payer: PPO | Source: Ambulatory Visit | Attending: Family Medicine | Admitting: Family Medicine

## 2017-12-21 DIAGNOSIS — N644 Mastodynia: Secondary | ICD-10-CM

## 2017-12-21 DIAGNOSIS — R928 Other abnormal and inconclusive findings on diagnostic imaging of breast: Secondary | ICD-10-CM | POA: Diagnosis not present

## 2017-12-21 DIAGNOSIS — M7989 Other specified soft tissue disorders: Secondary | ICD-10-CM

## 2017-12-21 DIAGNOSIS — N6489 Other specified disorders of breast: Secondary | ICD-10-CM | POA: Diagnosis not present

## 2017-12-21 LAB — HM MAMMOGRAPHY

## 2017-12-22 ENCOUNTER — Encounter: Payer: Self-pay | Admitting: Family Medicine

## 2018-01-03 ENCOUNTER — Other Ambulatory Visit: Payer: PPO

## 2018-01-16 DIAGNOSIS — R05 Cough: Secondary | ICD-10-CM | POA: Diagnosis not present

## 2018-01-16 DIAGNOSIS — Z6827 Body mass index (BMI) 27.0-27.9, adult: Secondary | ICD-10-CM | POA: Diagnosis not present

## 2018-01-16 DIAGNOSIS — R5381 Other malaise: Secondary | ICD-10-CM | POA: Diagnosis not present

## 2018-01-16 DIAGNOSIS — J09X2 Influenza due to identified novel influenza A virus with other respiratory manifestations: Secondary | ICD-10-CM | POA: Diagnosis not present

## 2018-01-16 DIAGNOSIS — I700 Atherosclerosis of aorta: Secondary | ICD-10-CM | POA: Diagnosis not present

## 2018-01-16 DIAGNOSIS — R509 Fever, unspecified: Secondary | ICD-10-CM | POA: Diagnosis not present

## 2018-01-16 DIAGNOSIS — E860 Dehydration: Secondary | ICD-10-CM | POA: Diagnosis not present

## 2018-01-16 DIAGNOSIS — R000 Tachycardia, unspecified: Secondary | ICD-10-CM | POA: Diagnosis not present

## 2018-06-12 ENCOUNTER — Other Ambulatory Visit: Payer: Self-pay | Admitting: Family Medicine

## 2018-06-12 DIAGNOSIS — Z1231 Encounter for screening mammogram for malignant neoplasm of breast: Secondary | ICD-10-CM

## 2018-07-14 ENCOUNTER — Ambulatory Visit
Admission: RE | Admit: 2018-07-14 | Discharge: 2018-07-14 | Disposition: A | Discharge status: Home / Self Care | Payer: PPO | Source: Ambulatory Visit | Attending: Family Medicine | Admitting: Family Medicine

## 2018-07-14 DIAGNOSIS — M85851 Other specified disorders of bone density and structure, right thigh: Secondary | ICD-10-CM | POA: Diagnosis not present

## 2018-07-14 DIAGNOSIS — M85852 Other specified disorders of bone density and structure, left thigh: Secondary | ICD-10-CM | POA: Diagnosis not present

## 2018-07-14 DIAGNOSIS — M8580 Other specified disorders of bone density and structure, unspecified site: Secondary | ICD-10-CM

## 2018-07-14 DIAGNOSIS — Z780 Asymptomatic menopausal state: Secondary | ICD-10-CM | POA: Diagnosis not present

## 2018-07-14 LAB — HM DEXA SCAN

## 2018-09-13 ENCOUNTER — Ambulatory Visit (INDEPENDENT_AMBULATORY_CARE_PROVIDER_SITE_OTHER): Payer: PPO | Admitting: Family Medicine

## 2018-09-13 ENCOUNTER — Encounter: Payer: Self-pay | Admitting: Family Medicine

## 2018-09-13 VITALS — BP 130/77 | HR 73 | Temp 98.3°F | Resp 20 | Ht 64.0 in | Wt 162.6 lb

## 2018-09-13 DIAGNOSIS — S39012A Strain of muscle, fascia and tendon of lower back, initial encounter: Secondary | ICD-10-CM | POA: Diagnosis not present

## 2018-09-13 DIAGNOSIS — M545 Low back pain, unspecified: Secondary | ICD-10-CM

## 2018-09-13 MED ORDER — CYCLOBENZAPRINE HCL 5 MG PO TABS: 5.0000 mg | ORAL_TABLET | Freq: Two times a day (BID) | ORAL | 0 refills | Status: DC | PRN

## 2018-09-13 MED ORDER — NAPROXEN 500 MG PO TABS
500.0000 mg | ORAL_TABLET | Freq: Two times a day (BID) | ORAL | 0 refills | Status: DC
Start: 2018-09-13 — End: 2018-11-22

## 2018-09-13 MED ORDER — METHYLPREDNISOLONE ACETATE 80 MG/ML IJ SUSP
80.0000 mg | Freq: Once | INTRAMUSCULAR | Status: AC
  Administered 2018-09-13: 80 mg via INTRAMUSCULAR

## 2018-09-13 NOTE — Patient Instructions (Signed)
Start flexeril at night. Watch for sedation.  Steroid injection today. Start naproxen every 12 hours for 5-7 days, then take as needed only for discomfort.  Heating pad is helpful   Doctor orders a Massage on your cruise :)   Low Back Sprain A sprain is a stretch or tear in the bands of tissue that hold bones and joints together (ligaments). Sprains of the lower back (lumbar spine) are a common cause of low back pain. A sprain occurs when ligaments are overextended or stretched beyond their limits. The ligaments can become inflamed, resulting in pain and sudden muscle tightening (spasms). A sprain can be caused by an injury (trauma), or it can develop gradually due to overuse. There are three types of sprains:  Grade 1 is a mild sprain involving an overstretched ligament or a very slight tear of the ligament.  Grade 2 is a moderate sprain involving a partial tear of the ligament.  Grade 3 is a severe sprain involving a complete tear of the ligament.  What are the causes? This condition may be caused by:  Trauma, such as a fall or a hit to the body.  Twisting or overstretching the back. This may result from doing activities that require a lot of energy, such as lifting heavy objects.  What increases the risk? The following factors may increase your risk of getting this condition:  Playing contact sports.  Participating in sports or activities that put excessive stress on the back and require a lot of bending and twisting, including: ? Lifting weights or heavy objects. ? Gymnastics. ? Soccer. ? Figure skating. ? Snowboarding.  Being overweight or obese.  Having poor strength and flexibility.  What are the signs or symptoms? Symptoms of this condition may include:  Sharp or dull pain in the lower back that does not go away. Pain may extend to the buttocks.  Stiffness.  Limited range of motion.  Inability to stand up straight due to stiffness or pain.  Muscle  spasms.  How is this diagnosed?  This condition may be diagnosed based on:  Your symptoms.  Your medical history.  A physical exam. ? Your health care provider may push on certain areas of your back to determine the source of your pain. ? You may be asked to bend forward, backward, and side to side to assess the severity of your pain and your range of motion.  Imaging tests, such as: ? X-rays. ? MRI.  How is this treated? Treatment for this condition may include:  Applying heat and cold to the affected area.  Medicines to help relieve pain and to relax your muscles (muscle relaxants).  NSAIDs to help reduce swelling and discomfort.  Physical therapy.  When your symptoms improve, it is important to gradually return to your normal routine as soon as possible to reduce pain, avoid stiffness, and avoid loss of muscle strength. Generally, symptoms should improve within 6 weeks of treatment. However, recovery time varies. Follow these instructions at home: Managing pain, stiffness, and swelling  If directed, apply ice to the injured area during the first 24 hours after your injury. ? Put ice in a plastic bag. ? Place a towel between your skin and the bag. ? Leave the ice on for 20 minutes, 2-3 times a day.  If directed, apply heat to the affected area as often as told by your health care provider. Use the heat source that your health care provider recommends, such as a moist heat pack or  a heating pad. ? Place a towel between your skin and the heat source. ? Leave the heat on for 20-30 minutes. ? Remove the heat if your skin turns bright red. This is especially important if you are unable to feel pain, heat, or cold. You may have a greater risk of getting burned. Activity  Rest and return to your normal activities as told by your health care provider. Ask your health care provider what activities are safe for you.  Avoid activities that take a lot of effort (are strenuous) for  as long as told by your health care provider.  Do exercises as told by your health care provider. General instructions   Take over-the-counter and prescription medicines only as told by your health care provider.  If you have questions or concerns about safety while taking pain medicine, talk with your health care provider.  Do not drive or operate heavy machinery until you know how your pain medicine affects you.  Do not use any tobacco products, such as cigarettes, chewing tobacco, and e-cigarettes. Tobacco can delay bone healing. If you need help quitting, ask your health care provider.  Keep all follow-up visits as told by your health care provider. This is important. How is this prevented?  Warm up and stretch before being active.  Cool down and stretch after being active.  Give your body time to rest between periods of activity.  Avoid: ? Being physically inactive for long periods at a time. ? Exercising or playing sports when you are tired or in pain.  Use correct form when playing sports and lifting heavy objects.  Use good posture when sitting and standing.  Maintain a healthy weight.  Sleep on a mattress with medium firmness to support your back.  Make sure to use equipment that fits you, including shoes that fit well.  Be safe and responsible while being active to avoid falls.  Do at least 150 minutes of moderate-intensity exercise each week, such as brisk walking or water aerobics. Try a form of exercise that takes stress off your back, such as swimming or stationary cycling.  Maintain physical fitness, including: ? Strength. In particular, develop and maintain strong abdominal muscles. ? Flexibility. ? Cardiovascular fitness. ? Endurance. Contact a health care provider if:  Your back pain does not improve after 6 weeks of treatment.  Your symptoms get worse. Get help right away if:  Your back pain is severe.  You are unable to stand or walk.  You  develop pain in your legs.  You develop weakness in your buttocks or legs.  You have difficulty controlling when you urinate or when you have a bowel movement. This information is not intended to replace advice given to you by your health care provider. Make sure you discuss any questions you have with your health care provider. Document Released: 10/25/2005 Document Revised: 07/01/2016 Document Reviewed: 08/06/2015 [employer] Interactive Patient Education  [employer].

## 2018-09-13 NOTE — Progress Notes (Signed)
Jenny Hicks , May 01, 1951, 67 y.o., female MRN: [id number] Patient Care Team    Relationship Specialty Notifications Start End  Ma Hillock, DO PCP - General Family Medicine  09/16/16   Pixie Casino, MD Consulting Physician Cardiology  09/16/16   Vania Rea, MD Consulting Physician Obstetrics and Gynecology  09/16/16     Chief Complaint  Patient presents with  . Back Pain    lumbar area x 4 days left side     Subjective: Pt presents for an OV with complaints of left lumbar pain of 4 days duration.  Associated symptoms include left lumbar pain that is worse with transitioning positions and bending over. She does not recall a specific injury or increase in activity. She denies prior back injury or surgery. She has arthritis. She reports she does pick her grandchildren up and placed them on her left hip frequently and they can be heavy.  Pt has tried ibuprophen to ease their symptoms. No bladder or bowel changes. No radiation of pain  Depression screen [facility] 2/9 12/15/2017 10/08/2016  Decreased Interest 0 0  Down, Depressed, Hopeless 0 0  PHQ - 2 Score 0 0    Allergies  Allergen Reactions  . Hydrocodone-Acetaminophen Other (See Comments)    Made her BP go way up   Social History   Tobacco Use  . Smoking status: Never Smoker  . Smokeless tobacco: Never Used  Substance Use Topics  . Alcohol use: No   Past Medical History:  Diagnosis Date  . Bone spur of right foot   . Chicken pox   . DDD (degenerative disc disease), cervical   . GERD (gastroesophageal reflux disease)   . Heart palpitations   . History of nuclear stress test 03/2013   stress myoview; normal study; SVT (HR 200) - adenosine  . 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 Mitral valve prolapse    a. 03/2013 Echo: EF 55-60%, mild MVP, mild to mod MR.  . Osteopenia 2017  . Scoliosis   . SVT (supraventricular tachycardia) ([place])    a. 03/2013 occurred during ex MV-->resolved with adenosine and carotid massage.    Past Surgical History:  Procedure Laterality Date  . COLONOSCOPY  01/12/2011  . DIAGNOSTIC MAMMOGRAM  08/06/2014   digital  . TRANSTHORACIC ECHOCARDIOGRAM  03/2013   EF 55-60%, mild conc hypertrophy; mild MVP, mild-mod MR  . VAGINAL HYSTERECTOMY  1989   partial; pt has both ovaries   Family History  Problem Relation Age of Onset  . Heart attack Mother   . Hypertension Mother        Alzheimer's  . Stroke Mother        Afib  . Breast cancer Mother   . Osteoporosis Mother        cervical/femur fracture  . Myasthenia gravis Father   . Prostate cancer Father   . Prostate cancer Brother   . Arthritis Maternal Aunt   . Arthritis Maternal Uncle   . Prostate cancer Maternal Uncle   . Early death Maternal Grandmother        died after February 04, 2023 baby  . Heart disease Maternal Grandfather   . Congestive Heart Failure Maternal Grandfather   . Ovarian cancer Paternal Grandmother   . Other Daughter        mitral valve damaged had to be repaired, cause unknown  . Colon cancer Neg Hx   . Esophageal cancer Neg Hx   . Rectal cancer Neg Hx  Allergies as of 09/13/2018      Reactions   Hydrocodone-acetaminophen Other (See Comments)   Made her BP go way up      Medication List        Accurate as of 09/13/18  9:59 AM. Always use your most recent med list.          CALCIUM 600 + D PO Take 600 mg by mouth daily. Vit D 800 units   CENTRUM PO Take by mouth daily.       All past medical history, surgical history, allergies, family history, immunizations andmedications were updated in the EMR today and reviewed under the history and medication portions of their EMR.     ROS: Negative, with the exception of above mentioned in HPI   Objective:  BP 130/77 (BP Location: Right Arm, Patient Position: Sitting, Cuff Size: Normal)   Pulse 73   Temp 98.3 F (36.8 C)   Resp 20   Ht 5\' 4"  (1.626 m)   Wt 162 lb 9.6 oz (73.8 kg)   SpO2 96%   BMI 27.91 kg/m  Body mass index is 27.91  kg/m. Gen: Afebrile. No acute distress. Nontoxic in appearance, well developed, well nourished.  HENT: AT. [place].  MMM Eyes:Pupils Equal Round Reactive to light, Extraocular movements intact,  Conjunctiva without redness, discharge or icterus. MSK: no erythema, no soft tissue swelling or ropiness. Pain and Decreased ROM in flexion and left rotation. Neg SLR and fabre. MS 5/5 BLE.  NV intact distally.  Skin: small dime size area of raised erythema left lower thoracic, non-tender. No purpura or petechiae.  Neuro: Guarded gait. PERLA. EOMi. Alert. Oriented x3  No exam data present No results found. No results found for this or any previous visit (from the past 24 hour(s)).  Assessment/Plan: Jenny Hicks is a 67 y.o. female present for OV for  Lumbar pain/Strain of lumbar region, initial encounter Exam consistent with lumbar strain. No red flags. - rest, ice/heat, massage, NSAIDS, Muscle relaxer, IM depo medrol.   - naproxen BID for 5-7 days and flexeril BID PRN prescribed (w/ sedation precautions). - methylPREDNISolone acetate (DEPO-MEDROL) injection 80 mg - Red area circled and pt to monitor for potential shingles. It is above area of discomfort and her discomfort is reproduced with MSK exam--> do not feel it is the cause of her pain today. Just precaution. She is to be seen if rash develops/shingles.  - F/U prn  Reviewed expectations re: course of current medical issues.  Discussed self-management of symptoms.  Outlined signs and symptoms indicating need for more acute intervention.  Patient verbalized understanding and all questions were answered.  Patient received an After-Visit Summary.    No orders of the defined types were placed in this encounter.    Note is dictated utilizing voice recognition software. Although note has been proof read prior to signing, occasional typographical errors still can be missed. If any questions arise, please do not hesitate to call for  verification.   electronically signed by:  Howard Pouch, DO  [place]

## 2018-09-14 ENCOUNTER — Ambulatory Visit
Admission: RE | Admit: 2018-09-14 | Discharge: 2018-09-14 | Disposition: A | Discharge status: Home / Self Care | Payer: PPO | Source: Ambulatory Visit | Attending: Family Medicine | Admitting: Family Medicine

## 2018-09-14 DIAGNOSIS — Z1231 Encounter for screening mammogram for malignant neoplasm of breast: Secondary | ICD-10-CM | POA: Diagnosis not present

## 2018-10-12 ENCOUNTER — Ambulatory Visit: Payer: PPO

## 2018-11-22 ENCOUNTER — Telehealth: Payer: Self-pay | Admitting: Family Medicine

## 2018-11-22 ENCOUNTER — Encounter: Payer: Self-pay | Admitting: Family Medicine

## 2018-11-22 ENCOUNTER — Ambulatory Visit (INDEPENDENT_AMBULATORY_CARE_PROVIDER_SITE_OTHER): Payer: PPO | Admitting: Family Medicine

## 2018-11-22 ENCOUNTER — Ambulatory Visit (INDEPENDENT_AMBULATORY_CARE_PROVIDER_SITE_OTHER): Payer: PPO

## 2018-11-22 VITALS — BP 130/75 | HR 76 | Temp 97.3°F | Resp 16 | Ht 64.0 in | Wt 166.0 lb

## 2018-11-22 DIAGNOSIS — M25522 Pain in left elbow: Secondary | ICD-10-CM

## 2018-11-22 DIAGNOSIS — W19XXXA Unspecified fall, initial encounter: Secondary | ICD-10-CM | POA: Diagnosis not present

## 2018-11-22 DIAGNOSIS — M79632 Pain in left forearm: Secondary | ICD-10-CM

## 2018-11-22 DIAGNOSIS — S52125A Nondisplaced fracture of head of left radius, initial encounter for closed fracture: Secondary | ICD-10-CM

## 2018-11-22 DIAGNOSIS — S52124A Nondisplaced fracture of head of right radius, initial encounter for closed fracture: Secondary | ICD-10-CM | POA: Diagnosis not present

## 2018-11-22 NOTE — Progress Notes (Signed)
Jenny Hicks , 1951/04/29, 68 y.o., female MRN: [id number] Patient Care Team    Relationship Specialty Notifications Start End  Ma Hillock, DO PCP - General Family Medicine  09/16/16   Pixie Casino, MD Consulting Physician Cardiology  09/16/16   Vania Rea, MD Consulting Physician Obstetrics and Gynecology  09/16/16     Chief Complaint  Patient presents with  . Arm Pain    L arm pain, fell on 11/10/2018, no swelling, no brusing. Arm hurts to move/rotate.     Subjective: Pt presents for an OV with complaints of left forearm and elbow pain  of 12 days duration.  Associated symptoms include dull achy constant pain of forearm and elbow. Increased sharp pain with supination. Pain with touch. Pt has never had a prior injury to arm. She fell on 11/10/2018 when she tripped backwards over a brick and fell landing on her left elbow. She denies any swelling or redness.  H/o osteopenia- takes calcium and vit d Pt has tried wrist splint and advil  to ease their symptoms.   Depression screen [facility] 2/9 12/15/2017 10/08/2016  Decreased Interest 0 0  Down, Depressed, Hopeless 0 0  PHQ - 2 Score 0 0    Allergies  Allergen Reactions  . Hydrocodone-Acetaminophen Other (See Comments)    Made her BP go way up   Social History   Tobacco Use  . Smoking status: Never Smoker  . Smokeless tobacco: Never Used  Substance Use Topics  . Alcohol use: No   Past Medical History:  Diagnosis Date  . Bone spur of right foot   . Chicken pox   . DDD (degenerative disc disease), cervical   . GERD (gastroesophageal reflux disease)   . Heart palpitations   . History of nuclear stress test 03/2013   stress myoview; normal study; SVT (HR 200) - adenosine  . 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 Mitral valve prolapse    a. 03/2013 Echo: EF 55-60%, mild MVP, mild to mod MR.  . Osteopenia 2017  . Scoliosis   . SVT (supraventricular tachycardia) ([place])    a. 03/2013 occurred during ex MV-->resolved with adenosine  and carotid massage.   Past Surgical History:  Procedure Laterality Date  . COLONOSCOPY  01/12/2011  . DIAGNOSTIC MAMMOGRAM  08/06/2014   digital  . TRANSTHORACIC ECHOCARDIOGRAM  03/2013   EF 55-60%, mild conc hypertrophy; mild MVP, mild-mod MR  . VAGINAL HYSTERECTOMY  1989   partial; pt has both ovaries   Family History  Problem Relation Age of Onset  . Heart attack Mother   . Hypertension Mother        Alzheimer's  . Stroke Mother        Afib  . Breast cancer Mother   . Osteoporosis Mother        cervical/femur fracture  . Myasthenia gravis Father   . Prostate cancer Father   . Prostate cancer Brother   . Arthritis Maternal Aunt   . Arthritis Maternal Uncle   . Prostate cancer Maternal Uncle   . Early death Maternal Grandmother        died after 02/10/23 baby  . Heart disease Maternal Grandfather   . Congestive Heart Failure Maternal Grandfather   . Ovarian cancer Paternal Grandmother   . Other Daughter        mitral valve damaged had to be repaired, cause unknown  . Breast cancer Cousin   . Colon cancer Neg Hx   .  Esophageal cancer Neg Hx   . Rectal cancer Neg Hx    Allergies as of 11/22/2018      Reactions   Hydrocodone-acetaminophen Other (See Comments)   Made her BP go way up      Medication List       Accurate as of November 22, 2018  9:24 AM. Always use your most recent med list.        CALCIUM 600 + D PO Take 600 mg by mouth daily. Vit D 800 units   CENTRUM PO Take by mouth daily.       All past medical history, surgical history, allergies, family history, immunizations andmedications were updated in the EMR today and reviewed under the history and medication portions of their EMR.     ROS: Negative, with the exception of above mentioned in HPI   Objective:  BP 130/75 (BP Location: Right Arm, Patient Position: Sitting, Cuff Size: Normal)   Pulse 76   Temp (!) 97.3 F (36.3 C) (Oral)   Resp 16   Ht 5\' 4"  (1.626 m)   Wt 166 lb (75.3 kg)   SpO2  96%   BMI 28.49 kg/m  Body mass index is 28.49 kg/m. Gen: Afebrile. No acute distress. Nontoxic in appearance, well developed, well nourished.  MSK: no erythema, no soft tissue swelling. TTP lateral aspect of elbow and proximal radial head to mid radius. Mid radius severely tender. Pain with supination, guarding with pronation. NV intact distally.  Skin: no rashes, purpura or petechiae. No bruising. Skin intact Neuro: *Normal gait. PERLA. EOMi. Alert. Oriented x3   No exam data present No results found. No results found for this or any previous visit (from the past 24 hour(s)).  Assessment/Plan: BREEANNA GALGANO is a 68 y.o. female present for OV for  Fall, initial encounter/Left forearm pain/ Left elbow pain - Concern for radial fracture with exam. Xrays ordered. Continue arm brace for now and nsaids. Once results received will provide either brace or refer to ortho depending on results.  - She is leaving for a cruise next week for 9 days- so the sooner we can get her set up the better.  - DG Forearm Left; Future - DG Elbow Complete Left; Future    Reviewed expectations re: course of current medical issues.  Discussed self-management of symptoms.  Outlined signs and symptoms indicating need for more acute intervention.  Patient verbalized understanding and all questions were answered.  Patient received an After-Visit Summary.   > 25 minutes spent with patient, >50% of time spent face to face    Orders Placed This Encounter  Procedures  . DG Forearm Left  . DG Elbow Complete Left     Note is dictated utilizing voice recognition software. Although note has been proof read prior to signing, occasional typographical errors still can be missed. If any questions arise, please do not hesitate to call for verification.   electronically signed by:  Howard Pouch, DO  [place]

## 2018-11-22 NOTE — Telephone Encounter (Signed)
Please inform patient the following information: Her radial head is fractured.  Check with Diane,  She is working on getting her seen asap at ortho before calling her.

## 2018-11-22 NOTE — Telephone Encounter (Signed)
Spoke with pt and advised of the radial head fx. Advised that she has an appt w Dr. Sharol Given at [facility] tomorrow morning at 8am, address and telephone number given.

## 2018-11-22 NOTE — Patient Instructions (Addendum)
Please have xray completed at ([facility]) --> [address], [place], [place] [address]   I am worried you have a fracture. Once I get results we will call and work out plan.     Please help [place] help you:  We are honored you have chosen [facility] for your Primary Care home. Below you will find basic instructions that you may need to access in the future. Please help [place] help you by reading the instructions, which cover many of the frequent questions we experience.   Prescription refills and request:  -In order to allow more efficient response time, please call your pharmacy for all refills. They will forward the request electronically to [place]. This allows for the quickest possible response. Request left on a nurse line can take longer to refill, since these are checked as time allows between office patients and other phone calls.  - refill request can take up to 3-5 working days to complete.  - If request is sent electronically and request is appropiate, it is usually completed in 1-2 business days.  - all patients will need to be seen routinely for all chronic medical conditions requiring prescription medications (see follow-up below). If you are overdue for follow up on your condition, you will be asked to make an appointment and we will call in enough medication to cover you until your appointment (up to 30 days).  - all controlled substances will require a face to face visit to request/refill.  - if you desire your prescriptions to go through a new pharmacy, and have an active script at original pharmacy, you will need to call your pharmacy and have scripts transferred to new pharmacy. This is completed between the pharmacy locations and not by your provider.    Results: If any images or labs were ordered, it can take up to 1 week to get results depending on the test ordered and the lab/facility running and resulting the test. - Normal or stable results, which do not need  further discussion, may be released to your mychart immediately with attached note to you. A call may not be generated for normal results. Please make certain to sign up for mychart. If you have questions on how to activate your mychart you can call the front office.  - If your results need further discussion, our office will attempt to contact you via phone, and if unable to reach you after 2 attempts, we will release your abnormal result to your mychart with instructions.  - All results will be automatically released in mychart after 1 week.  - Your provider will provide you with explanation and instruction on all relevant material in your results. Please keep in mind, results and labs may appear confusing or abnormal to the untrained eye, but it does not mean they are actually abnormal for you personally. If you have any questions about your results that are not covered, or you desire more detailed explanation than what was provided, you should make an appointment with your provider to do so.   Our office handles many outgoing and incoming calls daily. If we have not contacted you within 1 week about your results, please check your mychart to see if there is a message first and if not, then contact our office.  In helping with this matter, you help decrease call volume, and therefore allow [place] to be able to respond to patients needs more efficiently.   Acute office visits (sick visit):  An  acute visit is intended for a new problem and are scheduled in shorter time slots to allow schedule openings for patients with new problems. This is the appropriate visit to discuss a new problem. Problems will not be addressed by phone call or Echart message. Appointment is needed if requesting treatment. In order to provide you with excellent quality medical care with proper time for you to explain your problem, have an exam and receive treatment with instructions, these appointments should be limited to one new  problem per visit. If you experience a new problem, in which you desire to be addressed, please make an acute office visit, we save openings on the schedule to accommodate you. Please do not save your new problem for any other type of visit, let us take care of it properly and quickly for you.   Follow up visits:  Depending on your condition(s) your provider will need to see you routinely in order to provide you with quality care and prescribe medication(s). Most chronic conditions (Example: hypertension, Diabetes, depression/anxiety... etc), require visits a couple times a year. Your provider will instruct you on proper follow up for your personal medical conditions and history. Please make certain to make follow up appointments for your condition as instructed. Failing to do so could result in lapse in your medication treatment/refills. If you request a refill, and are overdue to be seen on a condition, we will always provide you with a 30 day script (once) to allow you time to schedule.    Medicare wellness (well visit): - we have a wonderful Nurse Maudie Mercury), that will meet with you and provide you will yearly medicare wellness visits. These visits should occur yearly (can not be scheduled less than 1 calendar year apart) and cover preventive health, immunizations, advance directives and screenings you are entitled to yearly through your medicare benefits. Do not miss out on your entitled benefits, this is when medicare will pay for these benefits to be ordered for you.  These are strongly encouraged by your provider and is the appropriate type of visit to make certain you are up to date with all preventive health benefits. If you have not had your medicare wellness exam in the last 12 months, please make certain to schedule one by calling the office and schedule your medicare wellness with Maudie Mercury as soon as possible.   Yearly physical (well visit):  - Adults are recommended to be seen yearly for physicals.  Check with your insurance and date of your last physical, most insurances require one calendar year between physicals. Physicals include all preventive health topics, screenings, medical exam and labs that are appropriate for gender/age and history. You may have fasting labs needed at this visit. This is a well visit (not a sick visit), new problems should not be covered during this visit (see acute visit).  - Pediatric patients are seen more frequently when they are younger. Your provider will advise you on well child visit timing that is appropriate for your their age. - This is not a medicare wellness visit. Medicare wellness exams do not have an exam portion to the visit. Some medicare companies allow for a physical, some do not allow a yearly physical. If your medicare allows a yearly physical you can schedule the medicare wellness with our nurse Maudie Mercury and have your physical with your provider after, on the same day. Please check with insurance for your full benefits.   Late Policy/No Shows:  - all new patients should arrive 15-30  minutes earlier than appointment to allow [place] time  to  obtain all personal demographics,  insurance information and for you to complete office paperwork. - All established patients should arrive 10-15 minutes earlier than appointment time to update all information and be checked in .  - In our best efforts to run on time, if you are late for your appointment you will be asked to either reschedule or if able, we will work you back into the schedule. There will be a wait time to work you back in the schedule,  depending on availability.  - If you are unable to make it to your appointment as scheduled, please call 24 hours ahead of time to allow [place] to fill the time slot with someone else who needs to be seen. If you do not cancel your appointment ahead of time, you may be charged a no show fee.

## 2018-11-23 ENCOUNTER — Ambulatory Visit (INDEPENDENT_AMBULATORY_CARE_PROVIDER_SITE_OTHER): Payer: PPO | Admitting: Physician Assistant

## 2018-11-23 ENCOUNTER — Encounter (INDEPENDENT_AMBULATORY_CARE_PROVIDER_SITE_OTHER): Payer: Self-pay | Admitting: Orthopedic Surgery

## 2018-11-23 ENCOUNTER — Telehealth: Payer: Self-pay

## 2018-11-23 VITALS — Ht 64.0 in | Wt 166.0 lb

## 2018-11-23 DIAGNOSIS — S52125A Nondisplaced fracture of head of left radius, initial encounter for closed fracture: Secondary | ICD-10-CM

## 2018-11-23 NOTE — Telephone Encounter (Signed)
Pt came by the office. Advised pt to follow orthopedics recommendations. Pt voiced understanding and will FU with ortho as recommended.

## 2018-11-23 NOTE — Telephone Encounter (Signed)
Follow the orthopedics recommendations.

## 2018-11-23 NOTE — Telephone Encounter (Signed)
Pt is wanting to know if Dr. Raoul Pitch recommends bracing/immobilizing her forearm, if so would like to stop by the office and pick it up. Saw orthopedic, this morning and was told her it was a clean break, to just keep it wrapped.   Copied from [place] [phone]. Topic: General - Other >> Nov 23, 2018  8:29 AM Carolyn Stare wrote:  Pt saw Dr Sharol Given and he told her it was clean break and said she can keep it wrap and she said Dr Raoul Pitch told they have a moblizer for your forearm and she would like to come by and pick it up if Dr Raoul Pitch thinks this will help

## 2018-11-23 NOTE — Progress Notes (Signed)
Office Visit Note   Patient: Jenny Hicks           Date of Birth: 08-07-1951           MRN: [id number] Visit Date: 11/23/2018              Requested by: Ma Hillock, DO [address] [place], [place] [address] PCP: Ma Hillock, DO  Chief Complaint  Patient presents with  . Left Elbow - Pain    S/p fall 11/10/2018      HPI: Patient is a 68 year old woman who had a fall on 11/10/2018 when she is was stepping backwards lost her balance and fell and struck her left elbow area.  She reports that ever since then she is noticed pain over the elbow area especially whenever she has to rotate her hand up or down.  She also was feeling some pain along the forearm and along the thumb.  She was taking some Advil and wrapping the area and felt like the compression helped with her symptoms. Her x-rays from 11/22/2018 were reviewed and revealed a left radial neck fracture without displacement.  She does have arthritic changes about the wrist but the articular surface looks good without evidence for any fractures or other abnormality.  Assessment & Plan: Visit Diagnoses:  1. 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  Recommended continued compression for symptomatic improvement.  She can continue Advil as needed for pain.  Recommend no lifting more than 5 pounds for another 3 weeks and then gradual increase to tolerance.  She can move the wrist and the elbow to tolerance but just no weight more than 5 pounds.  She will follow-up here on an as-needed basis should she not gradually improve.  Follow-Up Instructions: Return if symptoms worsen or fail to improve.   Ortho Exam  Patient is alert, oriented, no adenopathy, well-dressed, normal affect, normal respiratory effort. She has tenderness to palpation over the left radial head.  She is  neurovascularly intact.  She has pain with supination pronation.  There is no bruising no significant edema.  She has good elbow extension with mild pain with extension and good flexion with mild pain with full flexion.  Imaging: No results found. No images are attached to the encounter.  Labs: Lab Results  Component Value Date   HGBA1C 5.6 12/15/2017   HGBA1C 5.5 10/08/2016     Lab Results  Component Value Date   ALBUMIN 4.0 12/15/2017   ALBUMIN 4.1 10/08/2016   ALBUMIN 4.3 03/26/2013    Body mass index is 28.49 kg/m.  Orders:  No orders of the defined types were placed in this encounter.  No orders of the defined types were placed in this encounter.    Procedures: No procedures performed  Clinical Data: No additional findings.  ROS:  All other systems negative, except as noted in the HPI. Review of Systems  Objective: Vital Signs: Ht 5\' 4"  (1.626 m)   Wt 166 lb (75.3 kg)   BMI 28.49 kg/m   Specialty Comments:  No specialty comments available.  PMFS History: Patient Active Problem List   Diagnosis Date Noted  . Fall 11/22/2018  . Left forearm pain 11/22/2018  . Left elbow pain 11/22/2018  . Vitamin D deficiency 12/15/2017  . Overweight (BMI 25.0-29.9) 09/17/2016  . Hyperlipidemia 09/17/2016  .  Paroxysmal SVT (supraventricular tachycardia) ([place]) 09/16/2016  . Osteopenia 09/16/2016  . 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 MVP (mitral valve prolapse) 08/23/2013  . Mitral regurgitation 08/23/2013   Past Medical History:  Diagnosis Date  . Bone spur of right foot   . Chicken pox   . DDD (degenerative disc disease), cervical   . GERD (gastroesophageal reflux disease)   . Heart palpitations   . History of nuclear stress test 03/2013   stress myoview; normal study; SVT (HR 200) - adenosine  . 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 Mitral valve prolapse    a. 03/2013 Echo: EF 55-60%, mild MVP, mild to mod MR.  . Osteopenia 2017  . Scoliosis   . SVT  (supraventricular tachycardia) ([place])    a. 03/2013 occurred during ex MV-->resolved with adenosine and carotid massage.    Family History  Problem Relation Age of Onset  . Heart attack Mother   . Hypertension Mother        Alzheimer's  . Stroke Mother        Afib  . Breast cancer Mother   . Osteoporosis Mother        cervical/femur fracture  . Myasthenia gravis Father   . Prostate cancer Father   . Prostate cancer Brother   . Arthritis Maternal Aunt   . Arthritis Maternal Uncle   . Prostate cancer Maternal Uncle   . Early death Maternal Grandmother        died after 01-30-2023 baby  . Heart disease Maternal Grandfather   . Congestive Heart Failure Maternal Grandfather   . Ovarian cancer Paternal Grandmother   . Other Daughter        mitral valve damaged had to be repaired, cause unknown  . Breast cancer Cousin   . Colon cancer Neg Hx   . Esophageal cancer Neg Hx   . Rectal cancer Neg Hx     Past Surgical History:  Procedure Laterality Date  . COLONOSCOPY  01/12/2011  . DIAGNOSTIC MAMMOGRAM  08/06/2014   digital  . TRANSTHORACIC ECHOCARDIOGRAM  03/2013   EF 55-60%, mild conc hypertrophy; mild MVP, mild-mod MR  . VAGINAL HYSTERECTOMY  1989   partial; pt has both ovaries   Social History   Occupational History  . Occupation: retired Therapist, sports  Tobacco Use  . Smoking status: Never Smoker  . Smokeless tobacco: Never Used  Substance and Sexual Activity  . Alcohol use: No  . Drug use: No  . Sexual activity: Yes    Partners: Male    Comment: married

## 2018-11-27 ENCOUNTER — Encounter: Payer: Self-pay | Admitting: Family Medicine

## 2018-11-27 DIAGNOSIS — S52123A Displaced fracture of head of unspecified radius, initial encounter for closed fracture: Secondary | ICD-10-CM

## 2018-11-27 HISTORY — DX: Displaced fracture of head of unspecified radius, initial encounter for closed fracture: S52.123A

## 2018-11-27 NOTE — Progress Notes (Addendum)
Subjective:   Jenny Hicks is a 68 y.o. female who presents for Medicare Annual (Subsequent) preventive examination.  Review of Systems:  No ROS.  Medicare Wellness Visit. Additional risk factors are reflected in the social history.  Cardiac Risk Factors include: advanced age (>1men, >72 women);family history of premature cardiovascular disease   Sleep patterns: Sleeps 6-7 hours, feels rested.  Home Safety/Smoke Alarms: Feels safe in home. Smoke alarms in place.  Living environment; residence and Firearm Safety: Lives with husband in 1 story home. Rail at door.  Seat Belt Safety/Bike Helmet: Wears seat belt.   Female:   Pap-N/A       Mammo-09/14/2018, BI-RADS CATEGORY  1: Negative.       Dexa scan-07/14/2018, Osteopenia.        CCS-Colonoscopy 01/12/2011, polyp. Recall 10 years.      Objective:     Vitals: BP 124/64 (BP Location: Right Arm, Patient Position: Sitting, Cuff Size: Normal)   Pulse 83   Ht 5\' 4"  (1.626 m)   Wt 165 lb 4 oz (75 kg)   SpO2 97%   BMI 28.37 kg/m   Body mass index is 28.37 kg/m.  Advanced Directives 11/28/2018  Does Patient Have a Medical Advance Directive? No  Would patient like information on creating a medical advance directive? Yes (MAU/Ambulatory/Procedural Areas - Information given)    Tobacco Social History   Tobacco Use  Smoking Status Never Smoker  Smokeless Tobacco Never Used     Counseling given: Not Answered   Past Medical History:  Diagnosis Date  . Bone spur of right foot   . Chicken pox   . DDD (degenerative disc disease), cervical   . GERD (gastroesophageal reflux disease)   . Heart palpitations   . History of nuclear stress test 03/2013   stress myoview; normal study; SVT (HR 200) - adenosine  . 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 Mitral valve prolapse    a. 03/2013 Echo: EF 55-60%, mild MVP, mild to mod MR.  . Osteopenia 2017  . Scoliosis   . SVT (supraventricular tachycardia) ([place])    a. 03/2013 occurred during  ex MV-->resolved with adenosine and carotid massage.   Past Surgical History:  Procedure Laterality Date  . COLONOSCOPY  01/12/2011  . DIAGNOSTIC MAMMOGRAM  08/06/2014   digital  . TRANSTHORACIC ECHOCARDIOGRAM  03/2013   EF 55-60%, mild conc hypertrophy; mild MVP, mild-mod MR  . VAGINAL HYSTERECTOMY  1989   partial; pt has both ovaries   Family History  Problem Relation Age of Onset  . Heart attack Mother   . Hypertension Mother        Alzheimer's  . Stroke Mother        Afib  . Breast cancer Mother   . Osteoporosis Mother        cervical/femur fracture  . Myasthenia gravis Father   . Prostate cancer Father   . Prostate cancer Brother   . Arthritis Maternal Aunt   . Arthritis Maternal Uncle   . Prostate cancer Maternal Uncle   . Early death Maternal Grandmother        died after 01/20/23 baby  . Heart disease Maternal Grandfather   . Congestive Heart Failure Maternal Grandfather   . Ovarian cancer Paternal Grandmother   . Other Daughter        mitral valve damaged had to be repaired, cause unknown  . Breast cancer Cousin   . Colon cancer Neg Hx   . Esophageal cancer Neg  Hx   . Rectal cancer Neg Hx    Social History   Socioeconomic History  . Marital status: Married    Spouse name: Richardson Landry  . Number of children: 5  . Years of education: 78  . Highest education level: Not on file  Occupational History  . Occupation: retired Animal nutritionist  . Financial resource strain: Not on file  . Food insecurity:    Worry: Not on file    Inability: Not on file  . Transportation needs:    Medical: Not on file    Non-medical: Not on file  Tobacco Use  . Smoking status: Never Smoker  . Smokeless tobacco: Never Used  Substance and Sexual Activity  . Alcohol use: No  . Drug use: No  . Sexual activity: Yes    Partners: Male    Comment: married  Lifestyle  . Physical activity:    Days per week: Not on file    Minutes per session: Not on file  . Stress: Not on file    Relationships  . Social connections:    Talks on phone: Not on file    Gets together: Not on file    Attends religious service: Not on file    Active member of club or organization: Not on file    Attends meetings of clubs or organizations: Not on file    Relationship status: Not on file  Other Topics Concern  . Not on file  Social History Narrative   Married to [place], they have 5 children.    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   Drinks caffeine, takes a daily vitamin   Wears seatbelt, smoke detector in the home, no firearms in the home.    Feels safe in her relationships.     Outpatient Encounter Medications as of 11/28/2018  Medication Sig  . Calcium Carb-Cholecalciferol (CALCIUM 600 + D PO) Take 600 mg by mouth daily. Vit D 800 units  . Multiple Vitamins-Minerals (CENTRUM PO) Take by mouth daily.  Marland Kitchen Zoster Vaccine Adjuvanted [facility]) injection Inject 0.5 mLs into the muscle once for 1 dose.   No facility-administered encounter medications on file as of 11/28/2018.     Activities of Daily Living In your present state of health, do you have any difficulty performing the following activities: 11/28/2018 11/22/2018  Hearing? N N  Vision? N N  Difficulty concentrating or making decisions? N N  Walking or climbing stairs? N N  Dressing or bathing? N N  Doing errands, shopping? N N  Preparing Food and eating ? N -  Using the Toilet? N -  In the past six months, have you accidently leaked urine? N -  Do you have problems with loss of bowel control? N -  Managing your Medications? N -  Managing your Finances? N -  Housekeeping or managing your Housekeeping? N -  Some recent data might be hidden    Patient Care Team: Ma Hillock, DO as PCP - General (Family Medicine) Debara Pickett Nadean Corwin, MD as Consulting Physician (Cardiology) Vania Rea, MD as Consulting Physician (Obstetrics and Gynecology) Newt Minion, MD as Consulting Physician  (Orthopedic Surgery)    Assessment:   This is a routine wellness examination for Jenny Hicks.  Exercise Activities and Dietary recommendations Current Exercise Habits: The patient does not participate in regular exercise at present(house keeping. ), Exercise limited by: None identified   Diet (meal preparation, eat  out, water intake, caffeinated beverages, dairy products, fruits and vegetables): Drinks water, milk and tea.   Breakfast: egg; cereal; coffee Lunch: sandwich; salad Dinner: salad; chicken; vegetables.   Goals    . Weight (lb) < 155 lb (70.3 kg)     Lose weight by increasing activity and decreasing snacking.        Fall Risk Fall Risk  11/28/2018 09/13/2018 12/15/2017 10/08/2016  Falls in the past year? 1 0 No No  Number falls in past yr: 0 - - -  Injury with Fall? 1 - - -  Risk for fall due to : Other (Comment) - - -  Risk for fall due to: Comment walked backward - - -  Follow up Falls prevention discussed Falls evaluation completed - -    Depression Screen PHQ 2/9 Scores 11/28/2018 12/15/2017 10/08/2016  PHQ - 2 Score 0 0 0     Cognitive Function MMSE - Mini Mental State Exam 11/28/2018  Orientation to time 5  Orientation to Place 5  Registration 3  Attention/ Calculation 5  Recall 2  Language- name 2 objects 2  Language- repeat 1  Language- follow 3 step command 3  Language- read & follow direction 1  Write a sentence 1  Copy design 1  Total score 29        Immunization History  Administered Date(s) Administered  . Influenza Split 08/26/2011, 08/28/2012  . Influenza, High Dose Seasonal PF 07/18/2018  . Influenza-Unspecified 08/24/2014, 09/01/2015, 07/25/2016, 08/22/2017  . Pneumococcal Conjugate-13 10/08/2016  . Pneumococcal Polysaccharide-23 03/15/2013, 12/15/2017  . Td 12/15/2017  . Tdap 11/09/2007  . Zoster 11/06/2011     Screening Tests Health Maintenance  Topic Date Due  . DEXA SCAN  07/14/2020  . MAMMOGRAM  09/14/2020  . COLONOSCOPY   01/06/2021  . TETANUS/TDAP  12/16/2027  . INFLUENZA VACCINE  Completed  . Hepatitis C Screening  Completed  . PNA vac Low Risk Adult  Completed       Plan:    Shingles vaccine at pharmacy.   Bring a copy of your living will and/or healthcare power of attorney to your next office visit.  Continue doing brain stimulating activities (puzzles, reading, adult coloring books, staying active) to keep memory sharp.   I have personally reviewed and noted the following in the patient's chart:   . Medical and social history . Use of alcohol, tobacco or illicit drugs  . Current medications and supplements . Functional ability and status . Nutritional status . Physical activity . Advanced directives . List of other physicians . Hospitalizations, surgeries, and ER visits in previous 12 months . Vitals . Screenings to include cognitive, depression, and falls . Referrals and appointments  In addition, I have reviewed and discussed with patient certain preventive protocols, quality metrics, and best practice recommendations. A written personalized care plan for preventive services as well as general preventive health recommendations were provided to patient.     Gerilyn Nestle, RN  11/28/2018  F/U with PCP 12/2018  Medical screening examination/treatment/procedure(s) were performed by non-physician practitioner and as supervising physician I was immediately available for consultation/collaboration.  I agree with above assessment and plan.  Electronically Signed by: Howard Pouch, DO [facility]

## 2018-11-28 ENCOUNTER — Ambulatory Visit (INDEPENDENT_AMBULATORY_CARE_PROVIDER_SITE_OTHER): Payer: PPO

## 2018-11-28 ENCOUNTER — Other Ambulatory Visit: Payer: Self-pay

## 2018-11-28 VITALS — BP 124/64 | HR 83 | Ht 64.0 in | Wt 165.2 lb

## 2018-11-28 DIAGNOSIS — Z0000 Encounter for general adult medical examination without abnormal findings: Secondary | ICD-10-CM

## 2018-11-28 DIAGNOSIS — Z23 Encounter for immunization: Secondary | ICD-10-CM | POA: Diagnosis not present

## 2018-11-28 MED ORDER — ZOSTER VAC RECOMB ADJUVANTED 50 MCG/0.5ML IM SUSR: 0.5000 mL | Freq: Once | INTRAMUSCULAR | 1 refills | Status: AC

## 2018-11-28 NOTE — Patient Instructions (Addendum)
Shingles vaccine at pharmacy.   Bring a copy of your living will and/or healthcare power of attorney to your next office visit.  Continue doing brain stimulating activities (puzzles, reading, adult coloring books, staying active) to keep memory sharp.   Health Maintenance, Female Adopting a healthy lifestyle and getting preventive care can go a long way to promote health and wellness. Talk with your health care provider about what schedule of regular examinations is right for you. This is a good chance for you to check in with your provider about disease prevention and staying healthy. In between checkups, there are plenty of things you can do on your own. Experts have done a lot of research about which lifestyle changes and preventive measures are most likely to keep you healthy. Ask your health care provider for more information. Weight and diet Eat a healthy diet  Be sure to include plenty of vegetables, fruits, low-fat dairy products, and lean protein.  Do not eat a lot of foods high in solid fats, added sugars, or salt.  Get regular exercise. This is one of the most important things you can do for your health. ? Most adults should exercise for at least 150 minutes each week. The exercise should increase your heart rate and make you sweat (moderate-intensity exercise). ? Most adults should also do strengthening exercises at least twice a week. This is in addition to the moderate-intensity exercise. Maintain a healthy weight  Body mass index (BMI) is a measurement that can be used to identify possible weight problems. It estimates body fat based on height and weight. Your health care provider can help determine your BMI and help you achieve or maintain a healthy weight.  For females 20 years of age and older: ? A BMI below 18.5 is considered underweight. ? A BMI of 18.5 to 24.9 is normal. ? A BMI of 25 to 29.9 is considered overweight. ? A BMI of 30 and above is considered obese. Watch  levels of cholesterol and blood lipids  You should start having your blood tested for lipids and cholesterol at 68 years of age, then have this test every 5 years.  You may need to have your cholesterol levels checked more often if: ? Your lipid or cholesterol levels are high. ? You are older than 68 years of age. ? You are at high risk for heart disease. Cancer screening Lung Cancer  Lung cancer screening is recommended for adults 55-80 years old who are at high risk for lung cancer because of a history of smoking.  A yearly low-dose CT scan of the lungs is recommended for people who: ? Currently smoke. ? Have quit within the past 15 years. ? Have at least a 30-pack-year history of smoking. A pack year is smoking an average of one pack of cigarettes a day for 1 year.  Yearly screening should continue until it has been 15 years since you quit.  Yearly screening should stop if you develop a health problem that would prevent you from having lung cancer treatment. Breast Cancer  Practice breast self-awareness. This means understanding how your breasts normally appear and feel.  It also means doing regular breast self-exams. Let your health care provider know about any changes, no matter how small.  If you are in your 20s or 30s, you should have a clinical breast exam (CBE) by a health care provider every 1-3 years as part of a regular health exam.  If you are 40 or older, have a   CBE every year. Also consider having a breast X-ray (mammogram) every year.  If you have a family history of breast cancer, talk to your health care provider about genetic screening.  If you are at high risk for breast cancer, talk to your health care provider about having an MRI and a mammogram every year.  Breast cancer gene (BRCA) assessment is recommended for women who have family members with BRCA-related cancers. BRCA-related cancers include: ? Breast. ? Ovarian. ? Tubal. ? Peritoneal  cancers.  Results of the assessment will determine the need for genetic counseling and BRCA1 and BRCA2 testing. Cervical Cancer Your health care provider may recommend that you be screened regularly for cancer of the pelvic organs (ovaries, uterus, and vagina). This screening involves a pelvic examination, including checking for microscopic changes to the surface of your cervix (Pap test). You may be encouraged to have this screening done every 3 years, beginning at age 64.  For women ages 92-65, health care providers may recommend pelvic exams and Pap testing every 3 years, or they may recommend the Pap and pelvic exam, combined with testing for human papilloma virus (HPV), every 5 years. Some types of HPV increase your risk of cervical cancer. Testing for HPV may also be done on women of any age with unclear Pap test results.  Other health care providers may not recommend any screening for nonpregnant women who are considered low risk for pelvic cancer and who do not have symptoms. Ask your health care provider if a screening pelvic exam is right for you.  If you have had past treatment for cervical cancer or a condition that could lead to cancer, you need Pap tests and screening for cancer for at least 20 years after your treatment. If Pap tests have been discontinued, your risk factors (such as having a new sexual partner) need to be reassessed to determine if screening should resume. Some women have medical problems that increase the chance of getting cervical cancer. In these cases, your health care provider may recommend more frequent screening and Pap tests. Colorectal Cancer  This type of cancer can be detected and often prevented.  Routine colorectal cancer screening usually begins at 68 years of age and continues through 68 years of age.  Your health care provider may recommend screening at an earlier age if you have risk factors for colon cancer.  Your health care provider may also  recommend using home test kits to check for hidden blood in the stool.  A small camera at the end of a tube can be used to examine your colon directly (sigmoidoscopy or colonoscopy). This is done to check for the earliest forms of colorectal cancer.  Routine screening usually begins at age 24.  Direct examination of the colon should be repeated every 5-10 years through 68 years of age. However, you may need to be screened more often if early forms of precancerous polyps or small growths are found. Skin Cancer  Check your skin from head to toe regularly.  Tell your health care provider about any new moles or changes in moles, especially if there is a change in a mole's shape or color.  Also tell your health care provider if you have a mole that is larger than the size of a pencil eraser.  Always use sunscreen. Apply sunscreen liberally and repeatedly throughout the day.  Protect yourself by wearing long sleeves, pants, a wide-brimmed hat, and sunglasses whenever you are outside. Heart disease, diabetes, and high blood  pressure  High blood pressure causes heart disease and increases the risk of stroke. High blood pressure is more likely to develop in: ? People who have blood pressure in the high end of the normal range (130-139/85-89 mm Hg). ? People who are overweight or obese. ? People who are African American.  If you are 83-21 years of age, have your blood pressure checked every 3-5 years. If you are 7 years of age or older, have your blood pressure checked every year. You should have your blood pressure measured twice-once when you are at a hospital or clinic, and once when you are not at a hospital or clinic. Record the average of the two measurements. To check your blood pressure when you are not at a hospital or clinic, you can use: ? An automated blood pressure machine at a pharmacy. ? A home blood pressure monitor.  If you are between 11 years and 80 years old, ask your health  care provider if you should take aspirin to prevent strokes.  Have regular diabetes screenings. This involves taking a blood sample to check your fasting blood sugar level. ? If you are at a normal weight and have a low risk for diabetes, have this test once every three years after 68 years of age. ? If you are overweight and have a high risk for diabetes, consider being tested at a younger age or more often. Preventing infection Hepatitis B  If you have a higher risk for hepatitis B, you should be screened for this virus. You are considered at high risk for hepatitis B if: ? You were born in a country where hepatitis B is common. Ask your health care provider which countries are considered high risk. ? Your parents were born in a high-risk country, and you have not been immunized against hepatitis B (hepatitis B vaccine). ? You have HIV or AIDS. ? You use needles to inject street drugs. ? You live with someone who has hepatitis B. ? You have had sex with someone who has hepatitis B. ? You get hemodialysis treatment. ? You take certain medicines for conditions, including cancer, organ transplantation, and autoimmune conditions. Hepatitis C  Blood testing is recommended for: ? Everyone born from [birth year] through 1965. ? Anyone with known risk factors for hepatitis C. Sexually transmitted infections (STIs)  You should be screened for sexually transmitted infections (STIs) including gonorrhea and chlamydia if: ? You are sexually active and are younger than 68 years of age. ? You are older than 68 years of age and your health care provider tells you that you are at risk for this type of infection. ? Your sexual activity has changed since you were last screened and you are at an increased risk for chlamydia or gonorrhea. Ask your health care provider if you are at risk.  If you do not have HIV, but are at risk, it may be recommended that you take a prescription medicine daily to prevent HIV  infection. This is called pre-exposure prophylaxis (PrEP). You are considered at risk if: ? You are sexually active and do not regularly use condoms or know the HIV status of your partner(s). ? You take drugs by injection. ? You are sexually active with a partner who has HIV. Talk with your health care provider about whether you are at high risk of being infected with HIV. If you choose to begin PrEP, you should first be tested for HIV. You should then be tested every 3 months for  as long as you are taking PrEP. Pregnancy  If you are premenopausal and you may become pregnant, ask your health care provider about preconception counseling.  If you may become pregnant, take 400 to 800 micrograms (mcg) of folic acid every day.  If you want to prevent pregnancy, talk to your health care provider about birth control (contraception). Osteoporosis and menopause  Osteoporosis is a disease in which the bones lose minerals and strength with aging. This can result in serious bone fractures. Your risk for osteoporosis can be identified using a bone density scan.  If you are 32 years of age or older, or if you are at risk for osteoporosis and fractures, ask your health care provider if you should be screened.  Ask your health care provider whether you should take a calcium or vitamin D supplement to lower your risk for osteoporosis.  Menopause may have certain physical symptoms and risks.  Hormone replacement therapy may reduce some of these symptoms and risks. Talk to your health care provider about whether hormone replacement therapy is right for you. Follow these instructions at home:  Schedule regular health, dental, and eye exams.  Stay current with your immunizations.  Do not use any tobacco products including cigarettes, chewing tobacco, or electronic cigarettes.  If you are pregnant, do not drink alcohol.  If you are breastfeeding, limit how much and how often you drink alcohol.  Limit  alcohol intake to no more than 1 drink per day for nonpregnant women. One drink equals 12 ounces of beer, 5 ounces of wine, or 1 ounces of hard liquor.  Do not use street drugs.  Do not share needles.  Ask your health care provider for help if you need support or information about quitting drugs.  Tell your health care provider if you often feel depressed.  Tell your health care provider if you have ever been abused or do not feel safe at home. This information is not intended to replace advice given to you by your health care provider. Make sure you discuss any questions you have with your health care provider. Document Released: 05/10/2011 Document Revised: 04/01/2016 Document Reviewed: 07/29/2015 Elsevier Interactive Patient Education  2019 [employer].

## 2018-12-06 IMAGING — MG DIGITAL DIAGNOSTIC UNILATERAL LEFT MAMMOGRAM WITH TOMO AND CAD
4 series · 4 of 12 positions shown · non-contrast
Comparison: Previous exam(s).

CLINICAL DATA: Patient complains of diffuse left breast pain.
Patient's physician palpated an abnormality inferior to the breast
along the ribcage.

EXAM:
DIGITAL DIAGNOSTIC LEFT MAMMOGRAM WITH CAD AND TOMO
ULTRASOUND LEFT BREAST

[L CC synth-2D]
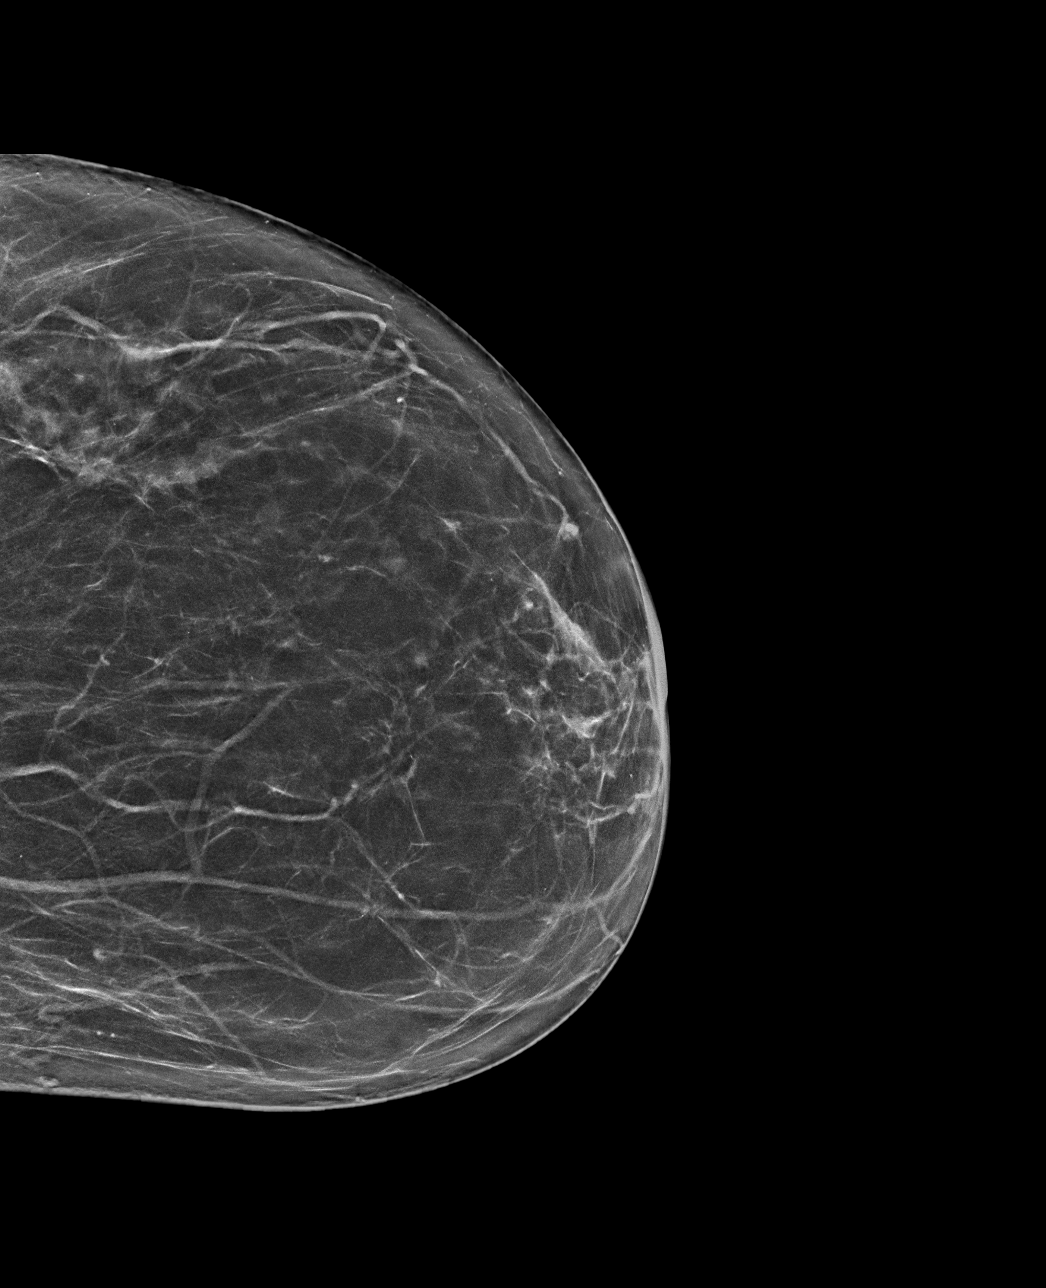

[L MLO synth-2D]
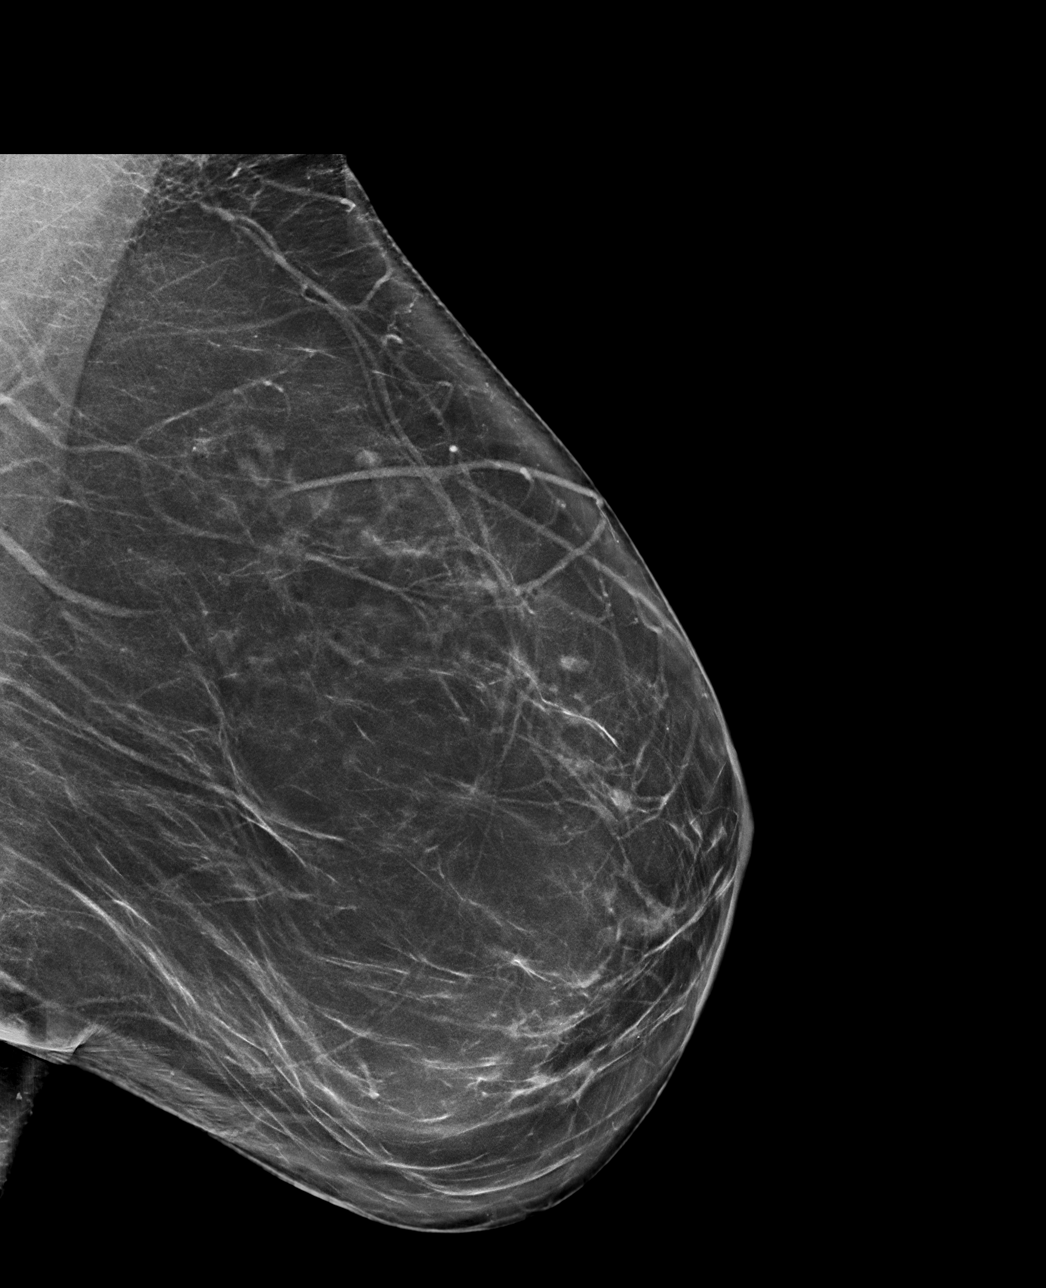

[L MLO tomo · tomo slice 41/82.0]
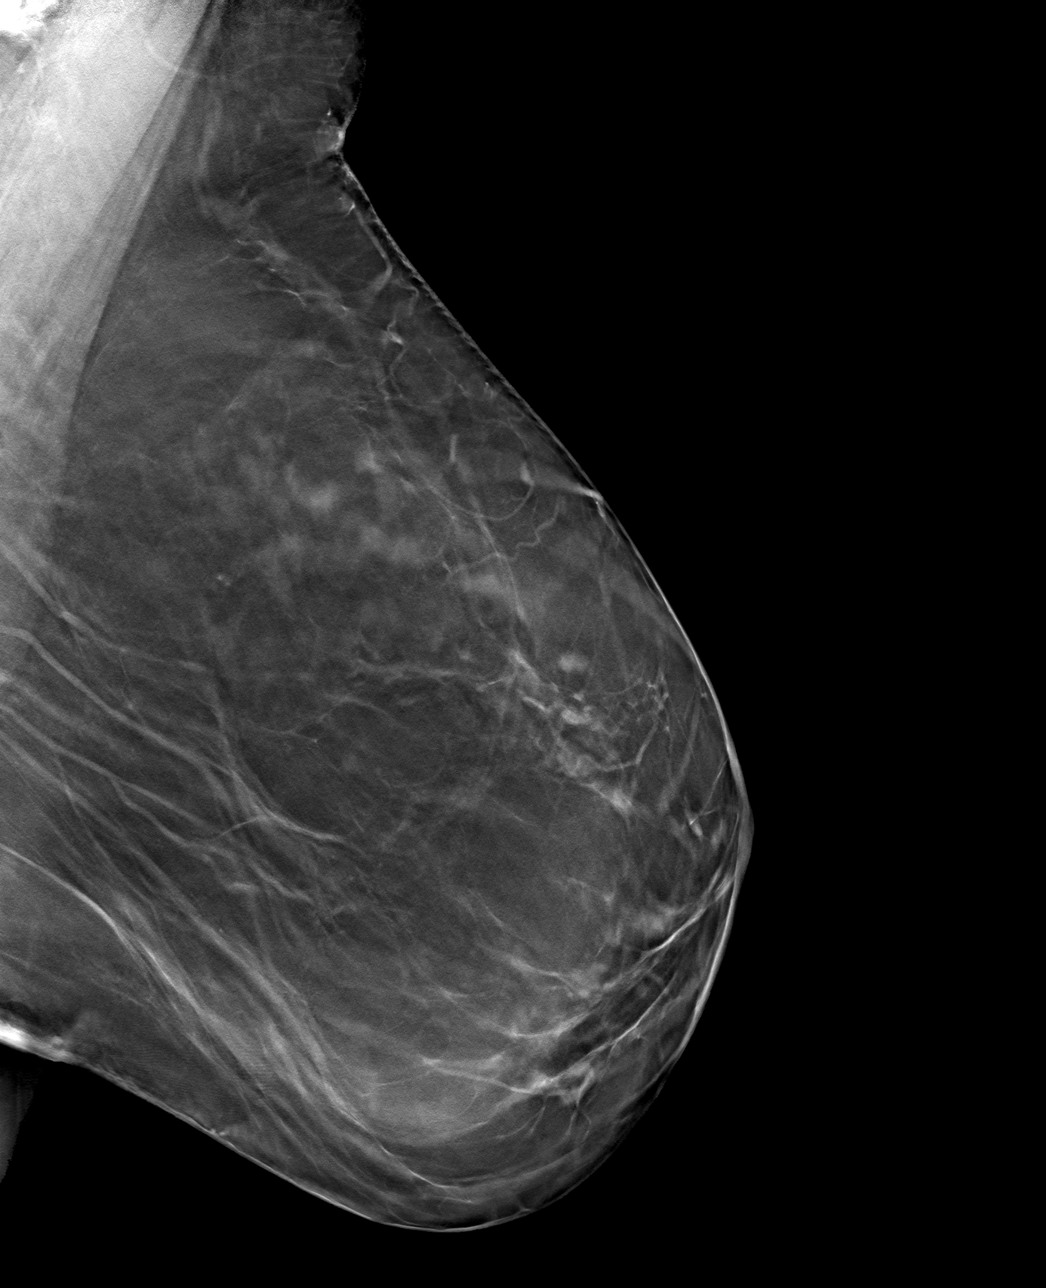

[L CC tomo · tomo slice 37/74.0]
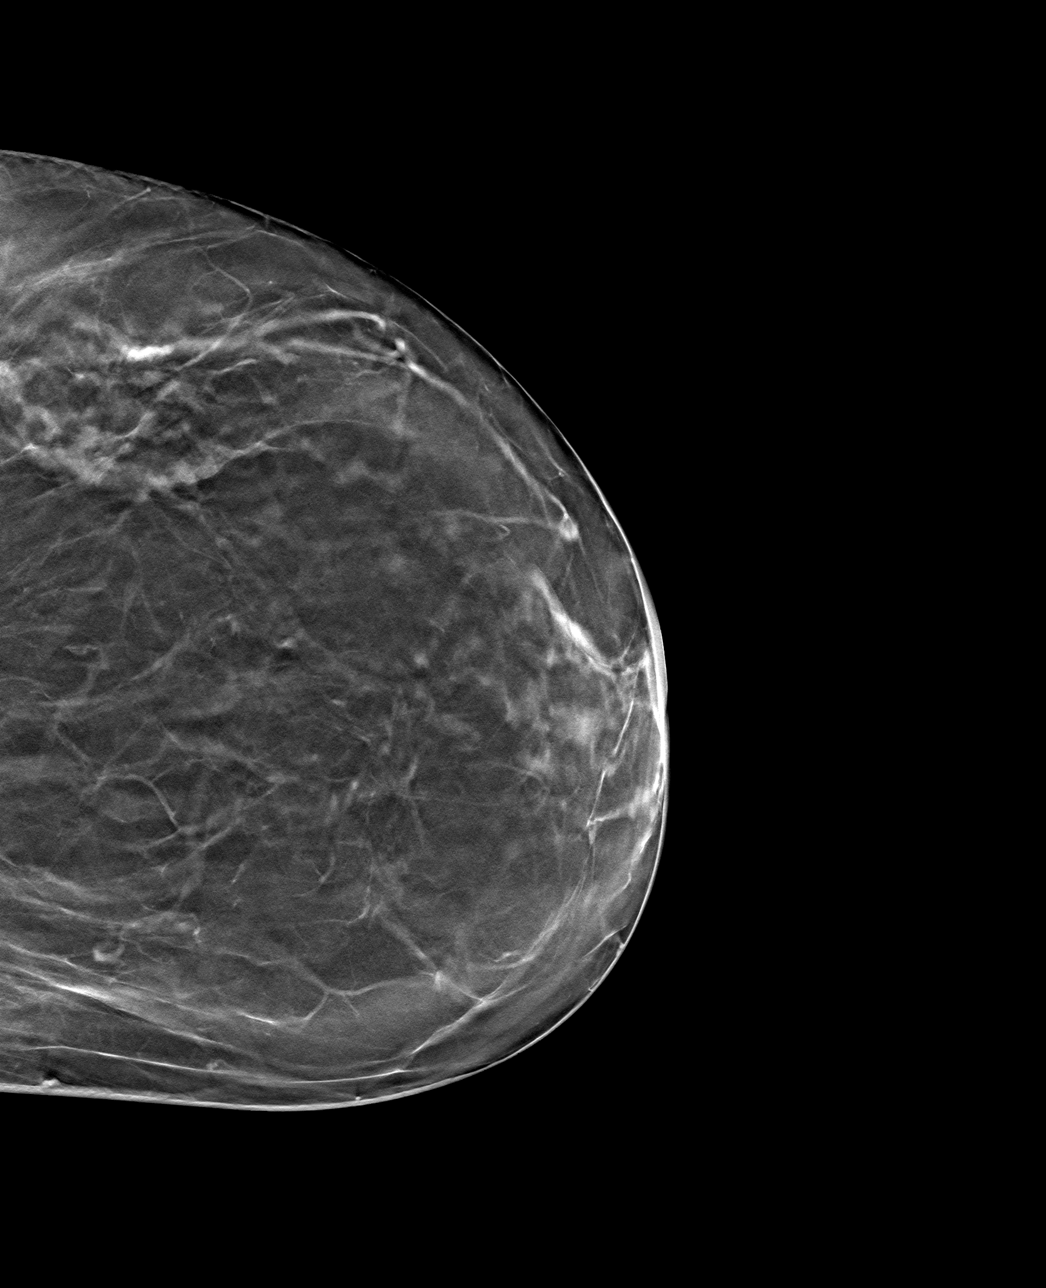

[4 of 12 positions shown; findings below may reference images not displayed]

ACR Breast Density Category b: There are scattered areas of
fibroglandular density.
FINDINGS: No suspicious mass, malignant type microcalcifications or distortion
detected in the left breast.

Mammographic images were processed with CAD.

On physical exam, I do not palpate a mass along the inferior aspect
of the left breast or ribcage.

Targeted ultrasound is performed, showing normal tissue along the
inferior aspect of the left breast and along the inferior aspect of
the rib cage. No solid or cystic mass, abnormal shadowing or
distortion visualized.
IMPRESSION: No evidence of malignancy in the left breast.

RECOMMENDATION:
Bilateral screening mammogram in Monday July, 2018 is recommended.

I have discussed the findings and recommendations with the patient.
Results were also provided in writing at the conclusion of the
visit. If applicable, a reminder letter will be sent to the patient
regarding the next appointment.

BI-RADS CATEGORY  1: Negative.

## 2018-12-21 ENCOUNTER — Encounter

## 2018-12-21 ENCOUNTER — Encounter: Payer: Self-pay | Admitting: Family Medicine

## 2018-12-21 ENCOUNTER — Ambulatory Visit (INDEPENDENT_AMBULATORY_CARE_PROVIDER_SITE_OTHER): Payer: PPO | Admitting: Family Medicine

## 2018-12-21 VITALS — BP 121/79 | HR 75 | Temp 98.1°F | Resp 16 | Ht 64.0 in | Wt 166.5 lb

## 2018-12-21 DIAGNOSIS — R1012 Left upper quadrant pain: Secondary | ICD-10-CM | POA: Diagnosis not present

## 2018-12-21 DIAGNOSIS — E663 Overweight: Secondary | ICD-10-CM

## 2018-12-21 DIAGNOSIS — I341 Nonrheumatic mitral (valve) prolapse: Secondary | ICD-10-CM

## 2018-12-21 DIAGNOSIS — E785 Hyperlipidemia, unspecified: Secondary | ICD-10-CM

## 2018-12-21 DIAGNOSIS — Z0000 Encounter for general adult medical examination without abnormal findings: Secondary | ICD-10-CM | POA: Diagnosis not present

## 2018-12-21 DIAGNOSIS — I340 Nonrheumatic mitral (valve) insufficiency: Secondary | ICD-10-CM

## 2018-12-21 DIAGNOSIS — E559 Vitamin D deficiency, unspecified: Secondary | ICD-10-CM | POA: Diagnosis not present

## 2018-12-21 DIAGNOSIS — I471 Supraventricular tachycardia, unspecified: Secondary | ICD-10-CM

## 2018-12-21 DIAGNOSIS — Z130 Encounter for screening for diseases of the blood and blood-forming organs and certain disorders involving the immune mechanism: Secondary | ICD-10-CM

## 2018-12-21 DIAGNOSIS — Z131 Encounter for screening for diabetes mellitus: Secondary | ICD-10-CM | POA: Diagnosis not present

## 2018-12-21 DIAGNOSIS — M8580 Other specified disorders of bone density and structure, unspecified site: Secondary | ICD-10-CM

## 2018-12-21 LAB — COMPREHENSIVE METABOLIC PANEL
ALT: 18 U/L (ref 0–35)
AST: 18 U/L (ref 0–37)
Albumin: 4.2 g/dL (ref 3.5–5.2)
Alkaline Phosphatase: 55 U/L (ref 39–117)
BUN: 18 mg/dL (ref 6–23)
CO2: 29 mEq/L (ref 19–32)
Calcium: 9.2 mg/dL (ref 8.4–10.5)
Chloride: 104 mEq/L (ref 96–112)
Creatinine, Ser: 0.69 mg/dL (ref 0.40–1.20)
GFR: 84.71 mL/min (ref >60.00)
Glucose, Bld: 76 mg/dL (ref 70–99)
Potassium: 4 mEq/L (ref 3.5–5.1)
Sodium: 141 mEq/L (ref 135–145)
Total Bilirubin: 0.6 mg/dL (ref 0.2–1.2)
Total Protein: 6.7 g/dL (ref 6.0–8.3)

## 2018-12-21 LAB — LIPASE: Lipase: 12 U/L (ref 11.0–59.0)

## 2018-12-21 LAB — VITAMIN D 25 HYDROXY (VIT D DEFICIENCY, FRACTURES): VITD: 33.19 ng/mL (ref 30.00–100.00)

## 2018-12-21 LAB — CBC
HCT: 42.5 % (ref 36.0–46.0)
Hemoglobin: 14.1 g/dL (ref 12.0–15.0)
MCHC: 33.2 g/dL (ref 30.0–36.0)
MCV: 93.1 fl (ref 78.0–100.0)
Platelets: 274 10*3/uL (ref 150.0–400.0)
RBC: 4.56 Mil/uL (ref 3.87–5.11)
RDW: 13.6 % (ref 11.5–15.5)
WBC: 5.6 10*3/uL (ref 4.0–10.5)

## 2018-12-21 LAB — LIPID PANEL
Cholesterol: 185 mg/dL (ref 0–200)
HDL: 48.5 mg/dL (ref >39.00)
LDL Cholesterol: 103 mg/dL — ABNORMAL HIGH (ref 0–99)
NonHDL: 136.52
Total CHOL/HDL Ratio: 4
Triglycerides: 167 mg/dL — ABNORMAL HIGH (ref 0.0–149.0)
VLDL: 33.4 mg/dL (ref 0.0–40.0)

## 2018-12-21 LAB — TSH: TSH: 2.62 u[IU]/mL (ref 0.35–4.50)

## 2018-12-21 LAB — C-REACTIVE PROTEIN: CRP: <1 mg/dL (ref 0.5–20.0)

## 2018-12-21 LAB — SEDIMENTATION RATE: Sed Rate: 23 mm/hr (ref 0–30)

## 2018-12-21 LAB — HEMOGLOBIN A1C: Hgb A1c MFr Bld: 5.3 % (ref 4.6–6.5)

## 2018-12-21 NOTE — Patient Instructions (Signed)

## 2018-12-21 NOTE — Progress Notes (Signed)
Patient ID: Jenny Hicks, female  DOB: [birth year], 68 y.o.   MRN: [id number] Patient Care Team    Relationship Specialty Notifications Start End  Ma Hillock, DO PCP - General Family Medicine  09/16/16   Pixie Casino, MD Consulting Physician Cardiology  09/16/16   Vania Rea, MD Consulting Physician Obstetrics and Gynecology  09/16/16   Newt Minion, MD Consulting Physician Orthopedic Surgery  11/28/18     Chief Complaint  Patient presents with  . Annual Exam    Fasting. Still having some pain in L arm.   . Leg Swelling    Pt is concerned with when she goes on a cruise/vacation and her legs swelling     Subjective:  Jenny Hicks is a 68 y.o.  Female  present for CPE . All past medical history, surgical history, allergies, family history, immunizations, medications and social history were updated in the electronic medical record today. All recent labs, ED visits and hospitalizations within the last year were reviewed.  Left upper quadrant pain:  Patient presents for her preventative physical today with complaints of left upper quadrant pain.  Says been present since she was on her cruise January 24 through February 2.  She reports her stomach is just mildly tender, mostly when palpated or when clothing such as her pants pushes on the area.  She denies any bowel changes, blood in stool, nausea, vomit, night sweats, fever or chills.  SHe has a family history of pancreatic and ovarian cancers.  She did drink some alcohol on the cruise.  SHe also reports when she goes on cruises mostly she notices her legs swell bilaterally.  They go down after she returns from the cruise.  Health maintenance: updated 12/22/18 Colonoscopy: completed 01/2011, by Dr. Olevia Perches, resutls inflammatory polyp. follow up 10 years. Mammogram: completed:09/14/2018, birads 1. FHx present in mother (prostate and ovarian cancers also in many members of family on paternal side) Cervical cancer screening:  Hysterectomy 1989 (non-cancerous reasons), routinely follows with Dr. Stann Mainland (GYN)  Immunizations: td 12/2017 , Influenza UTD 2019(encouraged yearly), PNA series completed 2019.  zostavax completed. Getting singles vaccine- has script.  Infectious disease screening:Hep C and HIV completed DEXA: 07/14/2018, Osteopenia (-1.9).  Vitamin D within normal limits.12/21/2018 Assistive device: none Oxygen YYQ:MGNO Patient has a Dental home. Hospitalizations/ED visits: reviewed    Depression screen [facility] 2/9 12/21/2018 11/28/2018 12/15/2017 10/08/2016  Decreased Interest 0 0 0 0  Down, Depressed, Hopeless 0 0 0 0  PHQ - 2 Score 0 0 0 0   No flowsheet data found.      Immunization History  Administered Date(s) Administered  . Influenza Split 08/26/2011, 08/28/2012  . Influenza, High Dose Seasonal PF 07/18/2018  . Influenza-Unspecified 08/24/2014, 09/01/2015, 07/25/2016, 08/22/2017  . Pneumococcal Conjugate-13 10/08/2016  . Pneumococcal Polysaccharide-23 03/15/2013, 12/15/2017  . Td 12/15/2017  . Tdap 11/09/2007  . Zoster 11/06/2011     Past Medical History:  Diagnosis Date  . Bone spur of right foot   . Chicken pox   . DDD (degenerative disc disease), cervical   . GERD (gastroesophageal reflux disease)   . Heart palpitations   . History of nuclear stress test 03/2013   stress myoview; normal study; SVT (HR 200) - adenosine  . 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 Mitral valve prolapse    a. 03/2013 Echo: EF 55-60%, mild MVP, mild to mod MR.  . Osteopenia 2017  . Scoliosis   . SVT (supraventricular tachycardia) (  [place])    a. 03/2013 occurred during ex MV-->resolved with adenosine and carotid massage.   Allergies  Allergen Reactions  . Hydrocodone-Acetaminophen Other (See Comments)    Made her BP go way up   Past Surgical History:  Procedure Laterality Date  . COLONOSCOPY  01/12/2011  . DIAGNOSTIC MAMMOGRAM  08/06/2014   digital  . TRANSTHORACIC ECHOCARDIOGRAM  03/2013   EF 55-60%, mild conc  hypertrophy; mild MVP, mild-mod MR  . VAGINAL HYSTERECTOMY  1989   partial; pt has both ovaries   Family History  Problem Relation Age of Onset  . Heart attack Mother   . Hypertension Mother        Alzheimer's  . Stroke Mother        Afib  . Breast cancer Mother   . Osteoporosis Mother        cervical/femur fracture  . Myasthenia gravis Father   . Prostate cancer Father   . Prostate cancer Brother   . Arthritis Maternal Aunt   . Arthritis Maternal Uncle   . Prostate cancer Maternal Uncle   . Early death Maternal Grandmother        died after January 15, 2023 baby  . Heart disease Maternal Grandfather   . Congestive Heart Failure Maternal Grandfather   . Ovarian cancer Paternal Grandmother   . Other Daughter        mitral valve damaged had to be repaired, cause unknown  . Breast cancer Cousin   . Colon cancer Neg Hx   . Esophageal cancer Neg Hx   . Rectal cancer Neg Hx    Social History   Social History Narrative   Married to [place], they have 5 children.    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   Drinks caffeine, takes a daily vitamin   Wears seatbelt, smoke detector in the home, no firearms in the home.    Feels safe in her relationships.     Allergies as of 12/21/2018      Reactions   Hydrocodone-acetaminophen Other (See Comments)   Made her BP go way up      Medication List       Accurate as of December 21, 2018 11:59 PM. Always use your most recent med list.        CALCIUM 600 + D PO Take 600 mg by mouth daily. Vit D 800 units   CENTRUM PO Take by mouth daily.       All past medical history, surgical history, allergies, family history, immunizations andmedications were updated in the EMR today and reviewed under the history and medication portions of their EMR.     Recent Results (from the past 2160 hour(s))  CBC     Status: None   Collection Time: 12/21/18  9:17 AM  Result Value Ref Range   WBC 5.6 4.0 - 10.5 K/uL   RBC 4.56  3.87 - 5.11 Mil/uL   Platelets 274.0 150.0 - 400.0 K/uL   Hemoglobin 14.1 12.0 - 15.0 g/dL   HCT 42.5 36.0 - 46.0 %   MCV 93.1 78.0 - 100.0 fl   MCHC 33.2 30.0 - 36.0 g/dL   RDW 13.6 11.5 - 15.5 %  Comp Met (CMET)     Status: None   Collection Time: 12/21/18  9:17 AM  Result Value Ref Range   Sodium 141 135 - 145 mEq/L   Potassium 4.0 3.5 - 5.1 mEq/L   Chloride 104 96 -  112 mEq/L   CO2 29 19 - 32 mEq/L   Glucose, Bld 76 70 - 99 mg/dL   BUN 18 6 - 23 mg/dL   Creatinine, Ser 0.69 0.40 - 1.20 mg/dL   Total Bilirubin 0.6 0.2 - 1.2 mg/dL   Alkaline Phosphatase 55 39 - 117 U/L   AST 18 0 - 37 U/L   ALT 18 0 - 35 U/L   Total Protein 6.7 6.0 - 8.3 g/dL   Albumin 4.2 3.5 - 5.2 g/dL   Calcium 9.2 8.4 - 10.5 mg/dL   GFR 84.71 >60.00 mL/min  TSH     Status: None   Collection Time: 12/21/18  9:17 AM  Result Value Ref Range   TSH 2.62 0.35 - 4.50 uIU/mL  HgB A1c     Status: None   Collection Time: 12/21/18  9:17 AM  Result Value Ref Range   Hgb A1c MFr Bld 5.3 4.6 - 6.5 %    Comment: Glycemic Control Guidelines for People with Diabetes:Non Diabetic:  <6%Goal of Therapy: <7%Additional Action Suggested:  >8%   Lipid panel     Status: Abnormal   Collection Time: 12/21/18  9:17 AM  Result Value Ref Range   Cholesterol 185 0 - 200 mg/dL    Comment: ATP III Classification       Desirable:  < 200 mg/dL               Borderline High:  200 - 239 mg/dL          High:  > = 240 mg/dL   Triglycerides 167.0 (H) 0.0 - 149.0 mg/dL    Comment: Normal:  <150 mg/dLBorderline High:  150 - 199 mg/dL   HDL 48.50 >39.00 mg/dL   VLDL 33.4 0.0 - 40.0 mg/dL   LDL Cholesterol 103 (H) 0 - 99 mg/dL   Total CHOL/HDL Ratio 4     Comment:                Men          Women1/2 Average Risk     3.4          3.3Average Risk          5.0          4.42X Average Risk          9.6          7.13X Average Risk          15.0          11.0                       NonHDL 136.52     Comment: NOTE:  Non-HDL goal should be 30 mg/dL  higher than patient's LDL goal (i.e. LDL goal of < 70 mg/dL, would have non-HDL goal of < 100 mg/dL)  Vitamin D (25 hydroxy)     Status: None   Collection Time: 12/21/18  9:17 AM  Result Value Ref Range   VITD 33.19 30.00 - 100.00 ng/mL  Lipase     Status: None   Collection Time: 12/21/18  9:17 AM  Result Value Ref Range   Lipase 12.0 11.0 - 59.0 U/L  Sedimentation rate     Status: None   Collection Time: 12/21/18  9:17 AM  Result Value Ref Range   Sed Rate 23 0 - 30 mm/hr  C-reactive protein     Status: None   Collection Time: 12/21/18  9:17 AM  Result Value Ref Range   CRP <1.0 0.5 - 20.0 mg/dL    Mm 3d Screen Breast Bilateral  Result Date: 09/15/2018 CLINICAL DATA:  Screening. EXAM: DIGITAL SCREENING BILATERAL MAMMOGRAM WITH TOMO AND CAD COMPARISON:  Previous exam(s). ACR Breast Density Category b: There are scattered areas of fibroglandular density. FINDINGS: There are no findings suspicious for malignancy. Images were processed with CAD. IMPRESSION: No mammographic evidence of malignancy. A result letter of this screening mammogram will be mailed directly to the patient. RECOMMENDATION: Screening mammogram in one year. (Code:SM-B-01Y) BI-RADS CATEGORY  1: Negative. Electronically Signed   By: Lovey Newcomer M.D.   On: 09/15/2018 12:42    ROS: 14 pt review of systems performed and negative (unless mentioned in an HPI)  Objective: BP 121/79 (BP Location: Right Arm, Patient Position: Sitting, Cuff Size: Normal)   Pulse 75   Temp 98.1 F (36.7 C) (Oral)   Resp 16   Ht 5' 4"  (1.626 m)   Wt 166 lb 8 oz (75.5 kg)   LMP  (Exact Date)   SpO2 97%   BMI 28.58 kg/m  Gen: Afebrile. No acute distress. Nontoxic in appearance, well-developed, well-nourished, pleasant Caucasian female.  Overweight. HENT: AT. [place]. Bilateral TM visualized and normal in appearance, normal external auditory canal. MMM, no oral lesions, adequate dentition. Bilateral nares within normal limits. Throat without  erythema, ulcerations or exudates.  No cough on exam, no hoarseness on exam. Eyes:Pupils Equal Round Reactive to light, Extraocular movements intact,  Conjunctiva without redness, discharge or icterus. Neck/lymp/endocrine: Supple, no lymphadenopathy, no thyromegaly CV: RRR no murmur, no edema, +2/4 P posterior tibialis pulses.  No carotid bruits. No JVD. Chest: CTAB, no wheeze, rhonchi or crackles.  Normal respiratory effort.  Good air movement. Abd: Soft.  Flat. ND.  Mild tenderness left upper quadrant to palpation.  BS present.  No masses palpated. No hepatosplenomegaly. No rebound tenderness or guarding. Skin: No rashes, purpura or petechiae. Warm and well-perfused. Skin intact. Neuro/Msk:  Normal gait. PERLA. EOMi. Alert. Oriented x3.  Cranial nerves II through XII intact. Muscle strength 5/5 upper/lower extremity. DTRs equal bilaterally. Psych: Normal affect, dress and demeanor. Normal speech. Normal thought content and judgment.   No exam data present  Assessment/plan: Jenny Hicks is a 68 y.o. female present for CPE. Paroxysmal SVT (supraventricular tachycardia) (HCC)/Overweight (BMI 25.0-29.9)/Hyperlipidemia, unspecified hyperlipidemia type/Mitral valve insufficiency, unspecified etiology/MVP (mitral valve prolapse) Diet and routine exercise.  - Comp Met (CMET) - TSH - Lipid panel Osteopenia, unspecified location/Vitamin D deficiency Vitamin D collected today. Repeat DEXA 2 to 3 years Screening for deficiency anemia - CBC Diabetes mellitus screening - HgB A1c Left upper quadrant pain - new problem, since her cruise 2 weeks ago. > 10 min with 50% of time spent face to face covering new problem. [place] pancreatic/ovarian cancers.Colonoscopy due 2022. - Lipase - Sedimentation rate - C-reactive protein - 2-4 weeks f/u if not improved or labs indicate need. Hopefully it will resolve, once back to her regular diet after cruise and start an OTC PPI.  Encounter for preventive health  examination Patient was encouraged to exercise greater than 150 minutes a week. Patient was encouraged to choose a diet filled with fresh fruits and vegetables, and lean meats. AVS provided to patient today for education/recommendation on gender specific health and safety maintenance. Colonoscopy: completed 01/2011, by Dr. Olevia Perches, resutls inflammatory polyp. follow up 10 years. Mammogram: completed:09/14/2018, birads 1. FHx present in mother (prostate and ovarian cancers also in many  members of family on paternal side) Cervical cancer screening: Hysterectomy 1989 (non-cancerous reasons), routinely follows with Dr. Stann Mainland (GYN)  Immunizations: td 12/2017 , Influenza UTD 2019(encouraged yearly), PNA series completed 2019.  zostavax completed. Getting singles vaccine- has script.  Infectious disease screening:Hep C and HIV completed DEXA: 07/14/2018, Osteopenia (-1.9).  Vitamin D within normal limits.12/21/2018   Return in about 1 year (around 12/22/2019) for CPE.  Electronically signed by: Howard Pouch, DO [place]

## 2018-12-22 ENCOUNTER — Encounter: Payer: Self-pay | Admitting: Family Medicine

## 2018-12-22 ENCOUNTER — Telehealth: Payer: Self-pay | Admitting: Family Medicine

## 2018-12-22 NOTE — Telephone Encounter (Signed)
Called and spoke with patient who verbalized understanding in regards to lab results, Omega 3/fish oil daily and when to F/U if not getting better.

## 2018-12-22 NOTE — Telephone Encounter (Signed)
Please inform patient the following information: Her labs are all normal, except very mild elevation in triglycerides at 167 (< 150 nl). Omega 3/fish oil 1000 mg a day would be helpful.  Routine exercise.  Diet higher in fiber, low in saturated fats.  Far as her stomach discomfort, follow the instructions provided to her during her exam.  The labs concerning the pancreas were normal.  Therefore if still having stomach discomfort in 2-4 weeks follow-up for further investigation.

## 2019-07-12 ENCOUNTER — Ambulatory Visit: Payer: Self-pay

## 2019-07-12 DIAGNOSIS — I471 Supraventricular tachycardia: Secondary | ICD-10-CM | POA: Diagnosis not present

## 2019-07-12 DIAGNOSIS — R002 Palpitations: Secondary | ICD-10-CM | POA: Diagnosis not present

## 2019-07-12 DIAGNOSIS — R5383 Other fatigue: Secondary | ICD-10-CM | POA: Diagnosis not present

## 2019-07-12 DIAGNOSIS — K219 Gastro-esophageal reflux disease without esophagitis: Secondary | ICD-10-CM | POA: Diagnosis not present

## 2019-07-12 DIAGNOSIS — R42 Dizziness and giddiness: Secondary | ICD-10-CM | POA: Diagnosis not present

## 2019-07-12 DIAGNOSIS — R079 Chest pain, unspecified: Secondary | ICD-10-CM | POA: Diagnosis not present

## 2019-07-12 LAB — BASIC METABOLIC PANEL
BUN: 17 (ref 4–21)
Creatinine: 0.8 (ref 0.5–1.1)
Potassium: 3.5 (ref 3.4–5.3)
Sodium: 142 (ref 137–147)

## 2019-07-12 LAB — CBC AND DIFFERENTIAL
HCT: 42 (ref 36–46)
Hemoglobin: 13.9 (ref 12.0–16.0)
Platelets: 253 (ref 150–399)
WBC: 8

## 2019-07-12 NOTE — Telephone Encounter (Signed)
Incoming  Call from Patient with complaint of tachycardia.  Patient states that her hear rate ranges from 90 to 130.   Sitting down.  Happened Monday.  Marland Kitchen Hx.  Of Reflux.  ,  [place] weak.  Patient is currently Out of town. She is  In [facility]  Denies pain. Does have a headache.  Reviewed Protocol. , Which recommend that Pt.  Go to Urgent care or ED for further evaluation.  Provided care advice also.      Reason for Disposition . [1] Heart beating very rapidly (e.g., > 140 / minute) AND [2] present now  (Exception: during exercise)  Answer Assessment - Initial Assessment Questions 1. DESCRIPTION: "Please describe your heart rate or heart beat that you are having" (e.g., fast/slow, regular/irregular, skipped or extra beats, "palpitations")     iregular 2. ONSET: "When did it start?" (Minutes, hours or days)       2010 3. DURATION: "How long does it last" (e.g., seconds, minutes, hours)     4. PATTERN "Does it come and go, or has it been constant since it started?"  "Does it get worse with exertion?"   "Are you feeling it now?"     Comes and goes 5. TAP: "Using your hand, can you tap out what you are feeling on a chair or table in front of you, so that I can hear?" (Note: not all patients can do this)       *No Answer* 6. HEART RATE: "Can you tell me your heart rate?" "How many beats in 15 seconds?"  (Note: not all patients can do this)     90 to 130 7. RECURRENT SYMPTOM: "Have you ever had this before?" If so, ask: "When was the last time?" and "What happened that time?"      Has happened before 8. CAUSE: "What do you think is causing the palpitations?"     *No Answer* 9. CARDIAC HISTORY: "Do you have any history of heart disease?" (e.g., heart attack, angina, bypass surgery, angioplasty, arrhythmia)     Tachycardeia.  Cant get a deep 10. OTHER SYMPTOMS: "Do you have any other symptoms?" (e.g., dizziness, chest pain, sweating, difficulty breathing)      denies 11. PREGNANCY: "Is there any  chance you are pregnant?" "When was your last menstrual period?"       na  Protocols used: [place]

## 2019-07-12 NOTE — Telephone Encounter (Signed)
Called patient and was unable to reach her. I lvm to call me back to triage her. FYI

## 2019-07-13 ENCOUNTER — Telehealth: Payer: Self-pay | Admitting: Family Medicine

## 2019-07-13 DIAGNOSIS — I471 Supraventricular tachycardia: Secondary | ICD-10-CM

## 2019-07-13 NOTE — Telephone Encounter (Signed)
Patient has a referral for cardiology. Please look in chart.

## 2019-07-13 NOTE — Telephone Encounter (Signed)
Pt was in ER [place] the beach and needs to see an cardiologist asap. She needs to have a referral faxed to the office below:  [facility] [place] [address] [phone]  Pt has SVT.  113 heartrate last night at the ER BP unusually high. They started her on metoprolol    The office requires the referral from pt's PCP. Her insurance does not require one.  Please advise. Thank you  Pt c/b [phone]

## 2019-07-13 NOTE — Telephone Encounter (Signed)
Referral has been faxed to [facility]. Patient aware

## 2019-07-13 NOTE — Telephone Encounter (Signed)
OK, will order referral.

## 2019-07-13 NOTE — Telephone Encounter (Signed)
Patient is needing a referral to cardi. Because Dr. Raoul Pitch isn't here, are you okay with writing the referral? Patient was seen 12/2018. Thank you

## 2019-07-17 ENCOUNTER — Telehealth: Payer: Self-pay | Admitting: Family Medicine

## 2019-07-17 NOTE — Telephone Encounter (Signed)
Patient requested to speak to triage nurse. When she called in she bypassed the option to speak to a nurse. Then she was transferred here. Patient was seen in the ER while out of town.  She is requesting to speak to a nurse asap. No details given.  Thank you

## 2019-07-17 NOTE — Telephone Encounter (Signed)
Pt was called and stated she feels like she cant get a deep breath which has been happening for years. She states she is able to breathe and is not SOB at all. Pt is not taking Metoprolol. Pt states she is having swelling in the abdomen that started yesterday, which has happened before off and on, just not this bad. Pt was advised if she felt the swelling was prohibiting her from taking a deep breath she needed to go to ED, she verbalized understanding. Pt thinks she can wait but will talk to her kids. Vitals today: 106/80, 138/86, Pulse 60-70's. No swelling in legs or feet. Denies fever or chest pain. Has appt with cardiology tomorrow to be evaluated. Encouraged to go to ED with not being able to take a deep breath. Pt verbalized understanding

## 2019-07-18 ENCOUNTER — Ambulatory Visit: Payer: PPO | Admitting: Medical

## 2019-07-18 DIAGNOSIS — R002 Palpitations: Secondary | ICD-10-CM | POA: Diagnosis not present

## 2019-07-19 DIAGNOSIS — R002 Palpitations: Secondary | ICD-10-CM | POA: Diagnosis not present

## 2019-07-26 ENCOUNTER — Telehealth: Payer: Self-pay | Admitting: Family Medicine

## 2019-07-26 NOTE — Telephone Encounter (Signed)
Patient brought records from where she was seen in [place] at [facility] ER Dept on 9/3. Symptoms were epigastric pain and a feeling of chest palpations  Patient stated that ER doctor wants her to schedule an ECHO visit. Patient does not want to go to doctors there in [facility], per her insurance they are out of network for her.  She is requesting Dr. Raoul Pitch to order ECHO and schedule here  [facility] advise and contact

## 2019-07-26 NOTE — Telephone Encounter (Signed)
Pt was called and she said she has a halter monitor on now and she has seen a cardiologist who wants her to get a ECHO. Pt was advised that her cardiologist is the one who needs to order this test for her if he is the one who said she needed to have it done. Pt was going to call MD.

## 2019-07-26 NOTE — Telephone Encounter (Signed)
Pt was called and message was left for patient telling she would need to make appt with Dr Raoul Pitch in order to discuss tests, plan of care, and if a referral would be needed to order these tests. Pt was asked to return call to schedule

## 2019-08-08 DIAGNOSIS — R000 Tachycardia, unspecified: Secondary | ICD-10-CM | POA: Diagnosis not present

## 2019-08-08 DIAGNOSIS — R002 Palpitations: Secondary | ICD-10-CM | POA: Diagnosis not present

## 2019-08-13 DIAGNOSIS — R002 Palpitations: Secondary | ICD-10-CM | POA: Diagnosis not present

## 2019-08-16 ENCOUNTER — Encounter: Payer: Self-pay | Admitting: Family Medicine

## 2019-09-06 DIAGNOSIS — R002 Palpitations: Secondary | ICD-10-CM | POA: Diagnosis not present

## 2019-09-07 ENCOUNTER — Other Ambulatory Visit: Payer: Self-pay | Admitting: Family Medicine

## 2019-09-07 DIAGNOSIS — Z1231 Encounter for screening mammogram for malignant neoplasm of breast: Secondary | ICD-10-CM

## 2019-09-11 ENCOUNTER — Encounter: Payer: Self-pay | Admitting: Internal Medicine

## 2019-09-11 ENCOUNTER — Other Ambulatory Visit: Payer: Self-pay

## 2019-09-11 ENCOUNTER — Ambulatory Visit: Payer: PPO | Admitting: Internal Medicine

## 2019-09-11 VITALS — BP 141/78 | HR 72 | Ht 64.0 in | Wt 156.4 lb

## 2019-09-11 DIAGNOSIS — I341 Nonrheumatic mitral (valve) prolapse: Secondary | ICD-10-CM

## 2019-09-11 DIAGNOSIS — I471 Supraventricular tachycardia: Secondary | ICD-10-CM

## 2019-09-11 DIAGNOSIS — I456 Pre-excitation syndrome: Secondary | ICD-10-CM | POA: Diagnosis not present

## 2019-09-11 NOTE — Patient Instructions (Signed)
Medication Instructions:  Your physician recommends that you continue on your current medications as directed. Please refer to the Current Medication list given to you today.  *If you need a refill on your cardiac medications before your next appointment, please call your pharmacy*   Follow-Up: At [facility], you and your health needs are our priority.  As part of our continuing mission to provide you with exceptional heart care, we have created designated Provider Care Teams.  These Care Teams include your primary Cardiologist (physician) and Advanced Practice Providers (APPs -  Physician Assistants and Nurse Practitioners) who all work together to provide you with the care you need, when you need it.  Your next appointment:   12 months  The format for your next appointment:   Either In Person or Virtual  Provider:   Raliegh Ip Mali Hilty, MD  Other Instructions

## 2019-09-11 NOTE — Progress Notes (Addendum)
OFFICE NOTE  Chief Complaint:  Feels well, no complaints  Primary Care Physician: Ma Hillock, DO  HPI:  Jenny Hicks pleasant 68 year old female with a history of palpitations in the past. She was noted at one point to have a short PR interval on her EKG. She underwent an echocardiogram in May of 2014 which showed normal systolic function EF of 0000000. She also has mild mitral valve prolapse and mild to moderate mitral regurgitation.  She has had a stress test on 03/23/2013. During that stress test which was a Bruce treadmill stress test she went into an SVT with a heart rate of 200, and an associated 1 mm of ST depression. She received 6 mg of adenosine and carotid sinus massage which broke the arrhythmia.  She was intermittently taking her Toprol at that time. She was then started on Toprol-XL 25 mg, however she is felt that this is kept her somewhat fatigued. Since then she's decreased her dose to 12.5 mg daily and is tolerating that well taking it regularly. She's had no further recurrence of SVT or palpitations. She does get regular checkups and has no other medical problems except for GERD for which she take Prilosec over-the-counter.  Jenny Hicks returns today for follow-up. She is taken herself off of beta blocker. She reports she's had no further palpitations over the past year. 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 She reports one of her daughters got married this summer. Overall she seems to be doing very well without any recurrent palpitations.  09/11/2019  Jenny Hicks is seen today as a new patient.  I last saw her more than 3 years ago , back in 2017 for follow-up of SVT.  She has been getting care in [place].  She had had some episodes of paroxysmal SVT.  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  Ultimately she was switched from beta-blocker till diltiazem which seem to be  working well for her and is less likely to cause her fatigue.  She had a repeat echo there which showed normal systolic function and mild mitral and tricuspid regurgitation.  Overall she has been fairly stable on the calcium channel blocker.  She wanted to reestablish care here in [place] as she will likely go back and forth between follow-up here in the cardiologist Dr. Patrice Paradise at [place].  PMHx:  Past Medical History:  Diagnosis Date  . Bone spur of right foot   . Chicken pox   . DDD (degenerative disc disease), cervical   . GERD (gastroesophageal reflux disease)   . Heart palpitations   . History of nuclear stress test 03/2013   stress myoview; normal study; SVT (HR 200) - adenosine  . 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 Mitral valve prolapse    a. 03/2013 Echo: EF 55-60%, mild MVP, mild to mod MR.  . Osteopenia 2017  . Scoliosis   . SVT (supraventricular tachycardia) ([place])    a. 03/2013 occurred during ex MV-->resolved with adenosine and carotid massage.    Past Surgical History:  Procedure Laterality Date  . COLONOSCOPY  01/12/2011  . DIAGNOSTIC MAMMOGRAM  08/06/2014   digital  . TRANSTHORACIC ECHOCARDIOGRAM  03/2013   EF 55-60%, mild conc hypertrophy; mild MVP, mild-mod MR  . VAGINAL HYSTERECTOMY  1989   partial; pt has both ovaries    FAMHx:  Family History  Problem Relation  Age of Onset  . Heart attack Mother   . Hypertension Mother        Alzheimer's  . Stroke Mother        Afib  . Breast cancer Mother   . Osteoporosis Mother        cervical/femur fracture  . Myasthenia gravis Father   . Prostate cancer Father   . Prostate cancer Brother   . Arthritis Maternal Aunt   . Arthritis Maternal Uncle   . Prostate cancer Maternal Uncle   . Early death Maternal Grandmother        died after 02-08-23 baby  . Heart disease Maternal Grandfather   . Congestive Heart Failure Maternal Grandfather   . Ovarian cancer Paternal Grandmother   . Other Daughter        mitral valve  damaged had to be repaired, cause unknown  . Breast cancer Cousin   . Colon cancer Neg Hx   . Esophageal cancer Neg Hx   . Rectal cancer Neg Hx     SOCHx:   reports that she has never smoked. She has never used smokeless tobacco. She reports that she does not drink alcohol or use drugs.  ALLERGIES:  Allergies  Allergen Reactions  . Hydrocodone-Acetaminophen Other (See Comments)    Made her BP go way up  . Pantoprazole Sodium Palpitations    ROS: A comprehensive review of systems was negative.  HOME MEDS: Current Outpatient Medications  Medication Sig Dispense Refill  . Calcium Carb-Cholecalciferol (CALCIUM 600 + D PO) Take 600 mg by mouth daily. Vit D 800 units    . diltiazem (DILTIAZEM CD) 120 MG 24 hr capsule Take 1 tablet by mouth daily.    Javier Docker Oil (OMEGA-3) 500 MG CAPS Take 1 capsule by mouth daily.    . Multiple Vitamins-Minerals (CENTRUM PO) Take by mouth daily.     No current facility-administered medications for this visit.     LABS/IMAGING: No results found for this or any previous visit (from the past 48 hour(s)). No results found.  VITALS: BP (!) 141/78   Pulse 72   Ht 5\' 4"  (1.626 m)   Wt 156 lb 6.4 oz (70.9 kg)   SpO2 98%   BMI 26.85 kg/m   EXAM: General appearance: alert and no distress Neck: no carotid bruit and no JVD Lungs: clear to auscultation bilaterally Heart: regular rate and rhythm, S1, S2 normal, 2/6 systolic murmur at apex with mid-systolic click, no rub or gallop noted Abdomen: soft, non-tender; bowel sounds normal; no masses,  no organomegaly Extremities: extremities normal, atraumatic, no cyanosis or edema Pulses: 2+ and symmetric Skin: Skin color, texture, turgor normal. No rashes or lesions Neurologic: Grossly normal Psych: Mood, affect normal  EKG: Sinus rhythm with PACs at 72, low voltage QRS-personally reviewed  ASSESSMENT: 1. LGL syndrome - with PSVT 2. MVP - mild MR, TR, nl LV function (echo 07/2019)  PLAN: 1.    [name] has had some recurrent SVT.  She did establish care with a Dr. Patrice Paradise in [place].  She probably has an accessory pathway as EKG is consistent with LGL syndrome.  There is a question of mitral valve prolapse and was noted to have some mild MR and TR on her echo in September of this year with normal systolic function.  Mitral valve prolapse was not excluded on this study.  Nonetheless she seems to be fairly stable on her current dose of diltiazem.  If she has further  breakthrough we may be able to increase the medication or consider referral for ablation, however at this point she is fearful of that and would prefer noninvasive treatment if possible.  Follow-up with me annually or sooner as necessary.  Pixie Casino, MD, [facility], [place] Director of the [facility]  Cardiovascular [facility] Diplomate of the American Board of Clinical Lipidology Attending Cardiologist  Direct Dial: [phone]  Fax: [phone]  Website:  [email or web address] 09/11/2019, 10:26 AM

## 2019-09-12 ENCOUNTER — Encounter: Payer: Self-pay | Admitting: Internal Medicine

## 2019-10-22 ENCOUNTER — Ambulatory Visit
Admission: RE | Admit: 2019-10-22 | Discharge: 2019-10-22 | Disposition: A | Discharge status: Home / Self Care | Payer: PPO | Source: Ambulatory Visit | Attending: Family Medicine | Admitting: Family Medicine

## 2019-10-22 DIAGNOSIS — Z1231 Encounter for screening mammogram for malignant neoplasm of breast: Secondary | ICD-10-CM

## 2019-10-22 LAB — HM MAMMOGRAPHY

## 2019-10-25 ENCOUNTER — Ambulatory Visit: Payer: PPO

## 2019-11-07 IMAGING — DX DG FOREARM 2V*L*
2 series · 2 of 2 positions shown · non-contrast
Comparison: Left elbow radiographs-earlier same date

CLINICAL DATA: Tripped over bricks falling backwards on [DATE][REDACTED]
with persistent pain involving the left forearm and elbow.

EXAM:
LEFT FOREARM - 2 VIEW

[forearm ap]
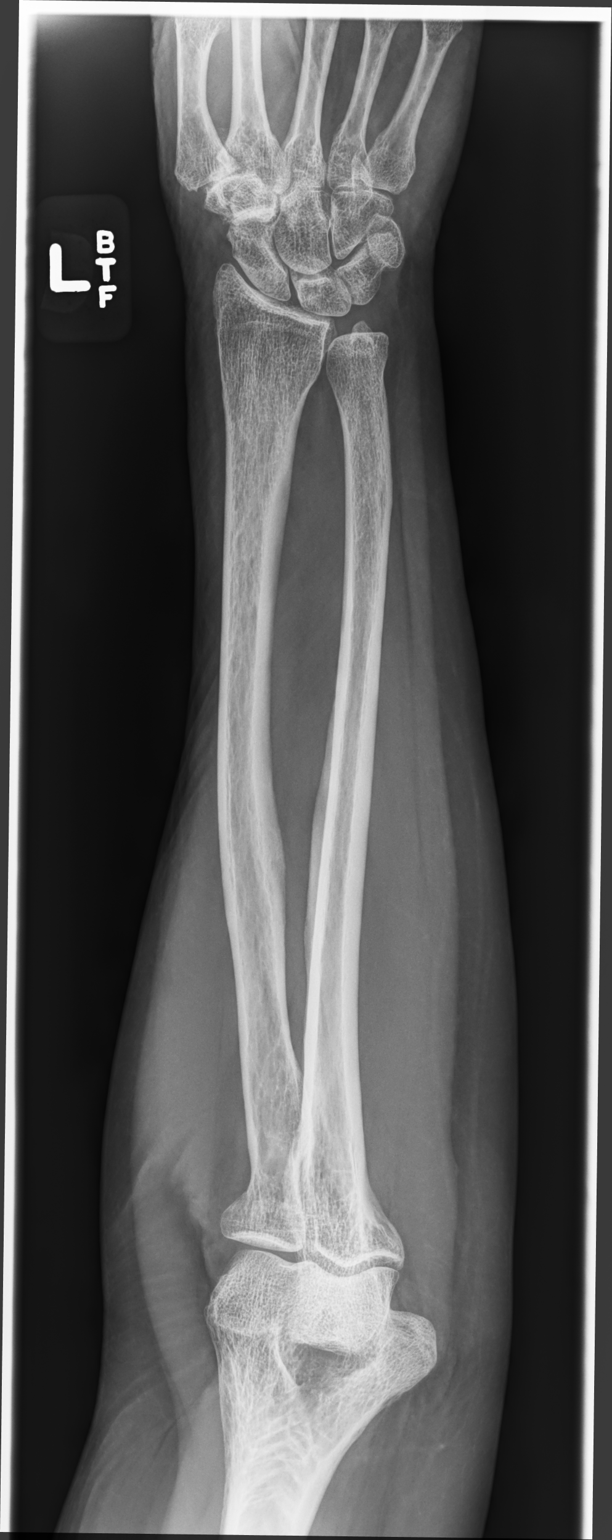

[forearm lat]
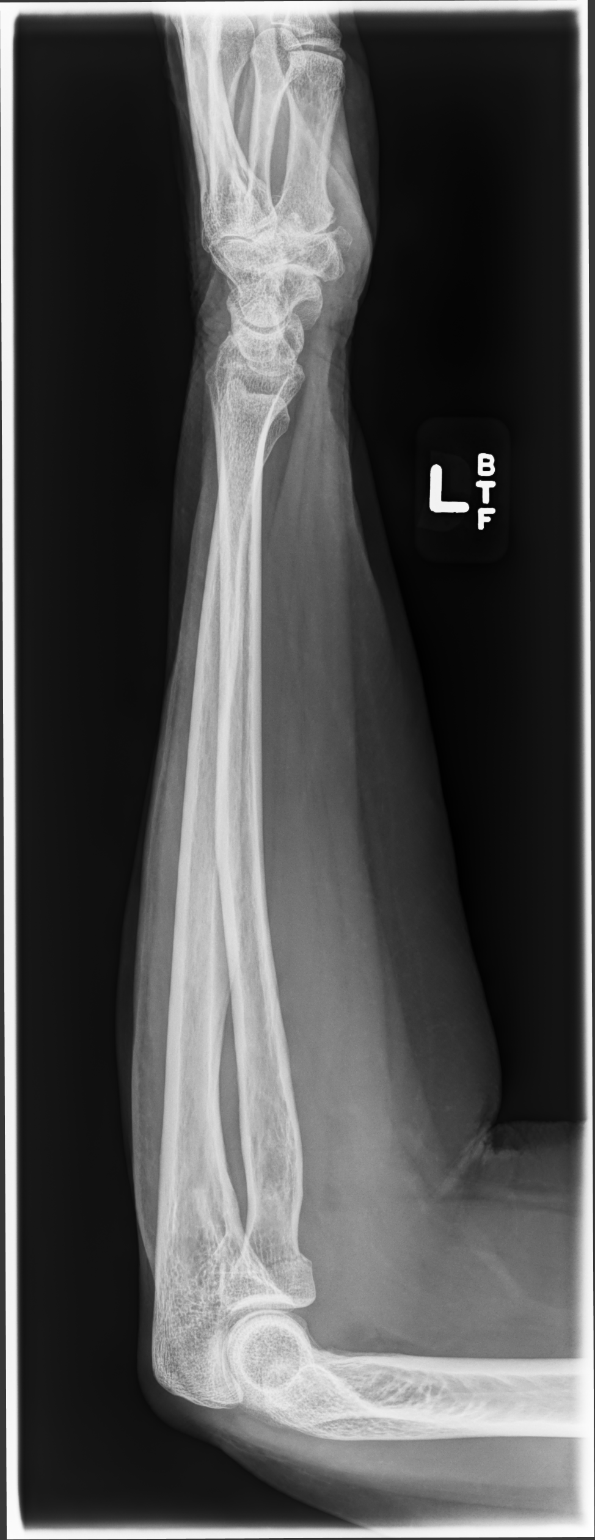

[2 of 2 positions shown; findings below may reference images not displayed]

FINDINGS: There is a nondisplaced fracture involving the head of the radius
without definitive intra-articular extension. No definite associated
elbow joint effusion.

No additional fractures are identified. Limited visualization of the
adjacent wrist is normal given obliquity and large field of view.
Degenerative change involving the STT joints of the base of the
thumb with joint space loss, subchondral sclerosis and
osteophytosis.

Regional soft tissues appear normal.  No radiopaque foreign body.
IMPRESSION: 1. Acute nondisplaced radial head fracture without definitive
intra-articular extension.
2. Degenerative change of the base of the thumb.

## 2019-11-21 DIAGNOSIS — C44319 Basal cell carcinoma of skin of other parts of face: Secondary | ICD-10-CM | POA: Diagnosis not present

## 2019-12-03 ENCOUNTER — Other Ambulatory Visit: Payer: Self-pay | Admitting: Plastic Surgery

## 2019-12-03 DIAGNOSIS — C44319 Basal cell carcinoma of skin of other parts of face: Secondary | ICD-10-CM | POA: Diagnosis not present

## 2019-12-17 ENCOUNTER — Encounter: Payer: Self-pay | Admitting: Family Medicine

## 2019-12-31 DIAGNOSIS — Z6827 Body mass index (BMI) 27.0-27.9, adult: Secondary | ICD-10-CM | POA: Diagnosis not present

## 2019-12-31 DIAGNOSIS — Z01419 Encounter for gynecological examination (general) (routine) without abnormal findings: Secondary | ICD-10-CM | POA: Diagnosis not present

## 2020-01-01 ENCOUNTER — Ambulatory Visit (INDEPENDENT_AMBULATORY_CARE_PROVIDER_SITE_OTHER): Payer: PPO | Admitting: Family Medicine

## 2020-01-01 ENCOUNTER — Encounter: Payer: Self-pay | Admitting: Family Medicine

## 2020-01-01 ENCOUNTER — Other Ambulatory Visit: Payer: Self-pay

## 2020-01-01 VITALS — BP 108/72 | HR 64 | Temp 97.7°F | Resp 17 | Ht 64.0 in | Wt 161.4 lb

## 2020-01-01 DIAGNOSIS — M8580 Other specified disorders of bone density and structure, unspecified site: Secondary | ICD-10-CM | POA: Diagnosis not present

## 2020-01-01 DIAGNOSIS — E785 Hyperlipidemia, unspecified: Secondary | ICD-10-CM | POA: Diagnosis not present

## 2020-01-01 DIAGNOSIS — E559 Vitamin D deficiency, unspecified: Secondary | ICD-10-CM | POA: Diagnosis not present

## 2020-01-01 DIAGNOSIS — E663 Overweight: Secondary | ICD-10-CM | POA: Diagnosis not present

## 2020-01-01 DIAGNOSIS — Z0000 Encounter for general adult medical examination without abnormal findings: Secondary | ICD-10-CM | POA: Diagnosis not present

## 2020-01-01 DIAGNOSIS — Z131 Encounter for screening for diabetes mellitus: Secondary | ICD-10-CM

## 2020-01-01 DIAGNOSIS — I471 Supraventricular tachycardia: Secondary | ICD-10-CM

## 2020-01-01 LAB — CBC
HCT: 40.6 % (ref 36.0–46.0)
Hemoglobin: 13.7 g/dL (ref 12.0–15.0)
MCHC: 33.7 g/dL (ref 30.0–36.0)
MCV: 92.3 fl (ref 78.0–100.0)
Platelets: 249 10*3/uL (ref 150.0–400.0)
RBC: 4.4 Mil/uL (ref 3.87–5.11)
RDW: 13.2 % (ref 11.5–15.5)
WBC: 4.9 10*3/uL (ref 4.0–10.5)

## 2020-01-01 LAB — LIPID PANEL
Cholesterol: 194 mg/dL (ref 0–200)
HDL: 51.2 mg/dL (ref >39.00)
LDL Cholesterol: 121 mg/dL — ABNORMAL HIGH (ref 0–99)
NonHDL: 142.37
Total CHOL/HDL Ratio: 4
Triglycerides: 109 mg/dL (ref 0.0–149.0)
VLDL: 21.8 mg/dL (ref 0.0–40.0)

## 2020-01-01 LAB — COMPREHENSIVE METABOLIC PANEL
ALT: 16 U/L (ref 0–35)
AST: 16 U/L (ref 0–37)
Albumin: 4.1 g/dL (ref 3.5–5.2)
Alkaline Phosphatase: 62 U/L (ref 39–117)
BUN: 18 mg/dL (ref 6–23)
CO2: 32 mEq/L (ref 19–32)
Calcium: 9.1 mg/dL (ref 8.4–10.5)
Chloride: 104 mEq/L (ref 96–112)
Creatinine, Ser: 0.71 mg/dL (ref 0.40–1.20)
GFR: 81.71 mL/min (ref >60.00)
Glucose, Bld: 94 mg/dL (ref 70–99)
Potassium: 4.3 mEq/L (ref 3.5–5.1)
Sodium: 140 mEq/L (ref 135–145)
Total Bilirubin: 0.7 mg/dL (ref 0.2–1.2)
Total Protein: 6.5 g/dL (ref 6.0–8.3)

## 2020-01-01 LAB — VITAMIN D 25 HYDROXY (VIT D DEFICIENCY, FRACTURES): VITD: 29.19 ng/mL — ABNORMAL LOW (ref 30.00–100.00)

## 2020-01-01 LAB — TSH: TSH: 1.99 u[IU]/mL (ref 0.35–4.50)

## 2020-01-01 MED ORDER — ZOSTER VAC RECOMB ADJUVANTED 50 MCG/0.5ML IM SUSR: 0.5000 mL | Freq: Once | INTRAMUSCULAR | 1 refills | Status: AC

## 2020-01-01 NOTE — Patient Instructions (Signed)
Health Maintenance After Age 69 After age 69, you are at a higher risk for certain long-term diseases and infections as well as injuries from falls. Falls are a major cause of broken bones and head injuries in people who are older than age 69. Getting regular preventive care can help to keep you healthy and well. Preventive care includes getting regular testing and making lifestyle changes as recommended by your health care provider. Talk with your health care provider about:  Which screenings and tests you should have. A screening is a test that checks for a disease when you have no symptoms.  A diet and exercise plan that is right for you. What should I know about screenings and tests to prevent falls? Screening and testing are the best ways to find a health problem early. Early diagnosis and treatment give you the best chance of managing medical conditions that are common after age 69. Certain conditions and lifestyle choices may make you more likely to have a fall. Your health care provider may recommend:  Regular vision checks. Poor vision and conditions such as cataracts can make you more likely to have a fall. If you wear glasses, make sure to get your prescription updated if your vision changes.  Medicine review. Work with your health care provider to regularly review all of the medicines you are taking, including over-the-counter medicines. Ask your health care provider about any side effects that may make you more likely to have a fall. Tell your health care provider if any medicines that you take make you feel dizzy or sleepy.  Osteoporosis screening. Osteoporosis is a condition that causes the bones to get weaker. This can make the bones weak and cause them to break more easily.  Blood pressure screening. Blood pressure changes and medicines to control blood pressure can make you feel dizzy.  Strength and balance checks. Your health care provider may recommend certain tests to check your  strength and balance while standing, walking, or changing positions.  Foot health exam. Foot pain and numbness, as well as not wearing proper footwear, can make you more likely to have a fall.  Depression screening. You may be more likely to have a fall if you have a fear of falling, feel emotionally low, or feel unable to do activities that you used to do.  Alcohol use screening. Using too much alcohol can affect your balance and may make you more likely to have a fall. What actions can I take to lower my risk of falls? General instructions  Talk with your health care provider about your risks for falling. Tell your health care provider if: ? You fall. Be sure to tell your health care provider about all falls, even ones that seem minor. ? You feel dizzy, sleepy, or off-balance.  Take over-the-counter and prescription medicines only as told by your health care provider. These include any supplements.  Eat a healthy diet and maintain a healthy weight. A healthy diet includes low-fat dairy products, low-fat (lean) meats, and fiber from whole grains, beans, and lots of fruits and vegetables. Home safety  Remove any tripping hazards, such as rugs, cords, and clutter.  Install safety equipment such as grab bars in bathrooms and safety rails on stairs.  Keep rooms and walkways well-lit. Activity   Follow a regular exercise program to stay fit. This will help you maintain your balance. Ask your health care provider what types of exercise are appropriate for you.  If you need a cane or   walker, use it as recommended by your health care provider.  Wear supportive shoes that have nonskid soles. Lifestyle  Do not drink alcohol if your health care provider tells you not to drink.  If you drink alcohol, limit how much you have: ? 0-1 drink a day for women. ? 0-2 drinks a day for men.  Be aware of how much alcohol is in your drink. In the [place] one drink equals one typical bottle of beer (12  oz), one-half glass of wine (5 oz), or one shot of hard liquor (1 oz).  Do not use any products that contain nicotine or tobacco, such as cigarettes and e-cigarettes. If you need help quitting, ask your health care provider. Summary  Having a healthy lifestyle and getting preventive care can help to protect your health and wellness after age 69.  Screening and testing are the best way to find a health problem early and help you avoid having a fall. Early diagnosis and treatment give you the best chance for managing medical conditions that are more common for people who are older than age 69.  Falls are a major cause of broken bones and head injuries in people who are older than age 69. Take precautions to prevent a fall at home.  Work with your health care provider to learn what changes you can make to improve your health and wellness and to prevent falls. This information is not intended to replace advice given to you by your health care provider. Make sure you discuss any questions you have with your health care provider. Document Revised: 02/15/2019 Document Reviewed: 09/07/2017 Elsevier Patient Education  2020 Elsevier Inc.  

## 2020-01-01 NOTE — Progress Notes (Signed)
This visit occurred during the SARS-CoV-2 public health emergency.  Safety protocols were in place, including screening questions prior to the visit, additional usage of staff PPE, and extensive cleaning of exam room while observing appropriate contact time as indicated for disinfecting solutions.    Patient ID: Jenny Hicks, female  DOB: 1951/02/10, 69 y.o.   MRN: [id number] Patient Care Team    Relationship Specialty Notifications Start End  Ma Hillock, DO PCP - General Family Medicine  09/16/16   Pixie Casino, MD Consulting Physician Cardiology  09/16/16   Vania Rea, MD Consulting Physician Obstetrics and Gynecology  09/16/16   Newt Minion, MD Consulting Physician Orthopedic Surgery  11/28/18     Chief Complaint  Patient presents with  . Annual Exam    Fasting. Mammogram 10/22/2019, GYN appt was yesterday.     Subjective:  Jenny Hicks is a 69 y.o.  Female  present for CPE. All past medical history, surgical history, allergies, family history, immunizations, medications and social history were updated in the electronic medical record today. All recent labs, ED visits and hospitalizations within the last year were reviewed.  Health maintenance:  Colonoscopy: completed 01/2011, by Dr. Olevia Perches, resutls inflammatory polyp. follow up 10 years. Mammogram: completed:10/22/2019, birads 1. [facility]- [place]. FHx present in mother (prostate and ovarian cancers also in many members of family on paternal side) Cervical cancer screening: Hysterectomy 1989 (non-cancerous reasons), routinely follows with Dr. Stann Mainland (GYN) >> having recto-bladder prolapse.  Immunizations: td2/2019, InfluenzaUTD 2020(encouraged yearly),PNA series completed 2019.zostavax completed. shingrix printed again for her.  Infectious disease screening:Hep C and HIVcompleted DEXA: 07/14/2018, Osteopenia (-1.9).  Vitamin D within normal limits Assistive device: none Oxygen [id number] Patient has a Dental  home. Hospitalizations/ED visits: reviewed  Depression screen [facility] 2/9 01/01/2020 12/21/2018 11/28/2018 12/15/2017 10/08/2016  Decreased Interest 0 0 0 0 0  Down, Depressed, Hopeless 0 0 0 0 0  PHQ - 2 Score 0 0 0 0 0   No flowsheet data found.   Immunization History  Administered Date(s) Administered  . Influenza Split 08/26/2011, 08/28/2012  . Influenza, High Dose Seasonal PF 07/18/2018, 09/03/2019  . Influenza-Unspecified 08/24/2014, 09/01/2015, 07/25/2016, 08/22/2017, 09/03/2019  . PFIZER SARS-COV-2 Vaccination 12/17/2019  . Pneumococcal Conjugate-13 10/08/2016  . Pneumococcal Polysaccharide-23 03/15/2013, 12/15/2017  . Td 12/15/2017  . Tdap 11/09/2007  . Zoster 11/06/2011     Past Medical History:  Diagnosis Date  . Bone spur of right foot   . Chicken pox   . DDD (degenerative disc disease), cervical   . GERD (gastroesophageal reflux disease)   . Heart palpitations   . History of nuclear stress test 03/2013   stress myoview; normal study; SVT (HR 200) - adenosine  . 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 Mitral valve prolapse    a. 03/2013 Echo: EF 55-60%, mild MVP, mild to mod MR.  . Osteopenia 2017  . Scoliosis   . SVT (supraventricular tachycardia) ([place])    a. 03/2013 occurred during ex MV-->resolved with adenosine and carotid massage.   Allergies  Allergen Reactions  . Hydrocodone-Acetaminophen Other (See Comments)    Made her BP go way up  . Pantoprazole Sodium Palpitations   Past Surgical History:  Procedure Laterality Date  . COLONOSCOPY  01/12/2011  . DIAGNOSTIC MAMMOGRAM  08/06/2014   digital  . TRANSTHORACIC ECHOCARDIOGRAM  03/2013   EF 55-60%, mild conc hypertrophy; mild MVP, mild-mod MR  . VAGINAL HYSTERECTOMY  1989   partial; pt has both ovaries  Family History  Problem Relation Age of Onset  . Heart attack Mother   . Hypertension Mother        Alzheimer's  . Stroke Mother        Afib  . Breast cancer Mother   . Osteoporosis Mother         cervical/femur fracture  . Myasthenia gravis Father   . Prostate cancer Father   . Prostate cancer Brother   . Arthritis Maternal Aunt   . Arthritis Maternal Uncle   . Prostate cancer Maternal Uncle   . Early death Maternal Grandmother        died after February 11, 2023 baby  . Heart disease Maternal Grandfather   . Congestive Heart Failure Maternal Grandfather   . Ovarian cancer Paternal Grandmother   . Other Daughter        mitral valve damaged had to be repaired, cause unknown  . Breast cancer Cousin   . Colon cancer Neg Hx   . Esophageal cancer Neg Hx   . Rectal cancer Neg Hx    Social History   Social History Narrative   Married to [place], they have 5 children.    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   Drinks caffeine, takes a daily vitamin   Wears seatbelt, smoke detector in the home, no firearms in the home.    Feels safe in her relationships.     Allergies as of 01/01/2020      Reactions   Hydrocodone-acetaminophen Other (See Comments)   Made her BP go way up   Pantoprazole Sodium Palpitations      Medication List       Accurate as of January 01, 2020 10:09 AM. If you have any questions, ask your nurse or doctor.        STOP taking these medications   dilTIAZem CD 120 MG 24 hr capsule Generic drug: diltiazem Stopped by: Howard Pouch, DO   Omega-3 500 MG Caps Stopped by: Howard Pouch, DO     TAKE these medications   CALCIUM 600 + D PO Take 600 mg by mouth daily. Vit D 800 units   CENTRUM PO Take by mouth daily.   metoprolol tartrate 25 MG tablet Commonly known as: LOPRESSOR Take 25 mg by mouth daily. 1/2 tablet daily (12.5mg  daily)   Zoster Vaccine Adjuvanted injection Commonly known as: SHINGRIX Inject 0.5 mLs into the muscle once for 1 dose. Marland Kitchen Rpt injection once in 2-6 months. Started by: Howard Pouch, DO       All past medical history, surgical history, allergies, family history, immunizations andmedications were  updated in the EMR today and reviewed under the history and medication portions of their EMR.     No results found for this or any previous visit (from the past 2160 hour(s)).  MM 3D SCREEN BREAST BILATERAL  Result Date: 10/22/2019 CLINICAL DATA:  Screening. EXAM: DIGITAL SCREENING BILATERAL MAMMOGRAM WITH TOMO AND CAD COMPARISON:  Previous exam(s). ACR Breast Density Category b: There are scattered areas of fibroglandular density. FINDINGS: There are no findings suspicious for malignancy. Images were processed with CAD. IMPRESSION: No mammographic evidence of malignancy. A result letter of this screening mammogram will be mailed directly to the patient. RECOMMENDATION: Screening mammogram in one year. (Code:SM-B-01Y) BI-RADS CATEGORY  1: Negative. Electronically Signed   By: Dorise Bullion III M.D   On: 10/22/2019 16:45     ROS: 14 pt review of systems performed  and negative (unless mentioned in an HPI)  Objective: BP 108/72 (BP Location: Left Arm, Patient Position: Sitting, Cuff Size: Normal)   Pulse 64   Temp 97.7 F (36.5 C) (Temporal)   Resp 17   Ht 5\' 4"  (1.626 m)   Wt 161 lb 6 oz (73.2 kg)   SpO2 95%   BMI 27.70 kg/m  Gen: Afebrile. No acute distress. Nontoxic in appearance, well-developed, well-nourished,  Pleasant caucasian female.  HENT: AT. [place]. Bilateral TM visualized and normal in appearance, normal external auditory canal. MMM, no oral lesions, adequate dentition. Bilateral nares within normal limits. Throat without erythema, ulcerations or exudates. no Cough on exam, no hoarseness on exam. Eyes:Pupils Equal Round Reactive to light, Extraocular movements intact,  Conjunctiva without redness, discharge or icterus. Neck/lymp/endocrine: Supple,no lymphadenopathy, no thyromegaly CV: RRR no murmur, no edema, +2/4 P posterior tibialis pulses. no carotid bruits. No JVD. Chest: CTAB, no wheeze, rhonchi or crackles. nomral Respiratory effort. good Air movement. Abd: Soft. flat.  NTND. BS present. no Masses palpated. No hepatosplenomegaly. No rebound tenderness or guarding. Skin: no rashes, purpura or petechiae. Warm and well-perfused. Skin intact. Neuro/Msk:  Normal gait. PERLA. EOMi. Alert. Oriented x3.  Cranial nerves II through XII intact. Muscle strength 5/5 upper/lower extremity. DTRs equal bilaterally. Psych: Normal affect, dress and demeanor. Normal speech. Normal thought content and judgment.   No exam data present  Assessment/plan: Jenny Hicks is a 69 y.o. female present for CPE Osteopenia, unspecified location/Vitamin D deficiency Dexa due 2022-2024 - Vitamin D (25 hydroxy) Paroxysmal SVT (supraventricular tachycardia) (HCC)/Overweight (BMI 25.0-29.9)/Hyperlipidemia, unspecified hyperlipidemia type Continue metoprolol. Managed by cardiology.  - CBC - Comprehensive metabolic panel - TSH - Lipid panel Diabetes mellitus screening - Hemoglobin A1c Encounter for preventive health examination Patient was encouraged to exercise greater than 150 minutes a week. Patient was encouraged to choose a diet filled with fresh fruits and vegetables, and lean meats. AVS provided to patient today for education/recommendation on gender specific health and safety maintenance. Colonoscopy: Due 2022 Mammogram: due 10/2020>> ordered.  Cervical cancer screening: continue to follow w/ GYN  Immunizations: UTD immunizations.  shingrix printed again for her.  Infectious disease screening:completed DEXA: rpt due 2022  Return in about 1 year (around 12/31/2020) for CPE (30 min).  Orders Placed This Encounter  Procedures  . CBC  . Comprehensive metabolic panel  . Hemoglobin A1c  . Lipid panel  . TSH  . Vitamin D (25 hydroxy)   Meds ordered this encounter  Medications  . Zoster Vaccine Adjuvanted [facility]) injection    Sig: Inject 0.5 mLs into the muscle once for 1 dose. Marland Kitchen Rpt injection once in 2-6 months.    Dispense:  0.5 mL    Refill:  1   Referral Orders   No referral(s) requested today     Electronically signed by: Howard Pouch, [place]

## 2020-01-02 LAB — HEMOGLOBIN A1C: Hgb A1c MFr Bld: 5.9 % (ref 4.6–6.5)

## 2020-01-09 ENCOUNTER — Encounter: Payer: Self-pay | Admitting: Family Medicine

## 2020-03-25 ENCOUNTER — Telehealth: Payer: Self-pay

## 2020-03-25 ENCOUNTER — Ambulatory Visit (INDEPENDENT_AMBULATORY_CARE_PROVIDER_SITE_OTHER): Payer: PPO | Admitting: Family Medicine

## 2020-03-25 ENCOUNTER — Other Ambulatory Visit: Payer: Self-pay

## 2020-03-25 ENCOUNTER — Encounter: Payer: Self-pay | Admitting: Family Medicine

## 2020-03-25 VITALS — BP 140/84 | HR 78 | Temp 98.0°F | Resp 18 | Ht 64.0 in | Wt 163.4 lb

## 2020-03-25 DIAGNOSIS — H9313 Tinnitus, bilateral: Secondary | ICD-10-CM

## 2020-03-25 DIAGNOSIS — M5412 Radiculopathy, cervical region: Secondary | ICD-10-CM

## 2020-03-25 MED ORDER — CYCLOBENZAPRINE HCL 5 MG PO TABS: 5.0000 mg | ORAL_TABLET | Freq: Two times a day (BID) | ORAL | 0 refills | Status: DC | PRN

## 2020-03-25 MED ORDER — MELOXICAM 7.5 MG PO TABS
7.5000 mg | ORAL_TABLET | Freq: Every day | ORAL | 1 refills | Status: DC
Start: 2020-03-25 — End: 2020-09-30

## 2020-03-25 NOTE — Telephone Encounter (Signed)
Received fax from [facility] my meds requesting PA to be completed for pts Flexeril. Tried to complete PA and it stated that PA was already being completed and did not allow me to finish PA. Pt was called and advised it may take a couple of days and I would have to contact insurance company and pt had already contacted them and started a PA herself. She will call back with any issues or if not able to fill. I mentioned to pt if it is not approved she can try good rx for discount on medication.    Jenny Hicks Key: Lennox Pippins - PA Case ID: [id number] - Rx #: [id number]
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
## 2020-03-25 NOTE — Patient Instructions (Addendum)
mobic once a day with food for at least 2 weeks. I have called in a refill also in case you need it longer. Avoid other nsaids when using.   Flexeril 1/2- 1 tab every 12 hours. Take at least before bed for 7 days to allow muscles to relax.   Rest, heat and massage will all help.     Cervical Radiculopathy  Cervical radiculopathy means that a nerve in the neck (a cervical nerve) is pinched or bruised. This can happen because of an injury to the cervical spine (vertebrae) in the neck, or as a normal part of getting older. This can cause pain or loss of feeling (numbness) that runs from your neck all the way down to your arm and fingers. Often, this condition gets better with rest. Treatment may be needed if the condition does not get better. What are the causes?  A neck injury.  A bulging disk in your spine.  Muscle movements that you cannot control (muscle spasms).  Tight muscles in your neck due to overuse.  Arthritis.  Breakdown in the bones and joints of the spine (spondylosis) due to getting older.  Bone spurs that form near the nerves in the neck. What are the signs or symptoms?  Pain. The pain may: ? Run from the neck to the arm and hand. ? Be very bad or irritating. ? Be worse when you move your neck.  Loss of feeling or tingling in your arm or hand.  Weakness in your arm or hand, in very bad cases. How is this treated? In many cases, treatment is not needed for this condition. With rest, the condition often gets better over time. If treatment is needed, options may include:  Wearing a soft neck collar (cervical collar) for short periods of time, as told by your doctor.  Doing exercises (physical therapy) to strengthen your neck muscles.  Taking medicines.  Having shots (injections) in your spine, in very bad cases.  Having surgery. This may be needed if other treatments do not help. The type of surgery that is used depends on the cause of your condition. Follow  these instructions at home: If you have a soft neck collar:  Wear it as told by your doctor. Remove it only as told by your doctor.  Ask your doctor if you can remove the collar for cleaning and bathing. If you are allowed to remove the collar for cleaning or bathing: ? Follow instructions from your doctor about how to remove the collar safely. ? Clean the collar by wiping it with mild soap and water and drying it completely. ? Take out any removable pads in the collar every 1-2 days. Wash them by hand with soap and water. Let them air-dry completely before you put them back in the collar. ? Check your skin under the collar for redness or sores. If you see any, tell your doctor. Managing pain      Take over-the-counter and prescription medicines only as told by your doctor.  If told, put ice on the painful area. ? If you have a soft neck collar, remove it as told by your doctor. ? Put ice in a plastic bag. ? Place a towel between your skin and the bag. ? Leave the ice on for 20 minutes, 2-3 times a day.  If using ice does not help, you can try using heat. Use the heat source that your doctor recommends, such as a moist heat pack or a heating pad. ?  Place a towel between your skin and the heat source. ? Leave the heat on for 20-30 minutes. ? Remove the heat if your skin turns bright red. This is very important if you are unable to feel pain, heat, or cold. You may have a greater risk of getting burned.  You may try a gentle neck and shoulder rub (massage). Activity  Rest as needed.  Return to your normal activities as told by your doctor. Ask your doctor what activities are safe for you.  Do exercises as told by your doctor or physical therapist.  Do not lift anything that is heavier than 10 lb (4.5 kg) until your doctor tells you that it is safe. General instructions  Use a flat pillow when you sleep.  Do not drive while wearing a soft neck collar. If you do not have a soft  neck collar, ask your doctor if it is safe to drive while your neck heals.  Ask your doctor if the medicine prescribed to you requires you to avoid driving or using heavy machinery.  Do not use any products that contain nicotine or tobacco, such as cigarettes, e-cigarettes, and chewing tobacco. These can delay healing. If you need help quitting, ask your doctor.  Keep all follow-up visits as told by your doctor. This is important. Contact a doctor if:  Your condition does not get better with treatment. Get help right away if:  Your pain gets worse and is not helped with medicine.  You lose feeling or feel weak in your hand, arm, face, or leg.  You have a high fever.  You have a stiff neck.  You cannot control when you poop or pee (have incontinence).  You have trouble with walking, balance, or talking. Summary  Cervical radiculopathy means that a nerve in the neck is pinched or bruised.  A nerve can get pinched from a bulging disk, arthritis, an injury to the neck, or other causes.  Symptoms include pain, tingling, or loss of feeling that goes from the neck into the arm or hand.  Weakness in your arm or hand can happen in very bad cases.  Treatment may include resting, wearing a soft neck collar, and doing exercises. You might need to take medicines for pain. In very bad cases, shots or surgery may be needed. This information is not intended to replace advice given to you by your health care provider. Make sure you discuss any questions you have with your health care provider. Document Revised: 09/15/2018 Document Reviewed: 09/15/2018 Elsevier Patient Education  2020 [employer].

## 2020-03-26 NOTE — Telephone Encounter (Signed)
Received Coverage determination request form. Filled out and faxed to [employer]. Scanned into chart.

## 2020-03-27 ENCOUNTER — Ambulatory Visit: Payer: PPO | Admitting: Family Medicine

## 2020-06-20 DIAGNOSIS — H0234 Blepharochalasis left upper eyelid: Secondary | ICD-10-CM | POA: Diagnosis not present

## 2020-07-15 ENCOUNTER — Ambulatory Visit: Payer: PPO

## 2020-07-30 ENCOUNTER — Ambulatory Visit: Payer: PPO

## 2020-08-19 ENCOUNTER — Other Ambulatory Visit: Payer: Self-pay | Admitting: Family Medicine

## 2020-08-19 ENCOUNTER — Telehealth: Payer: Self-pay

## 2020-08-19 DIAGNOSIS — Z1231 Encounter for screening mammogram for malignant neoplasm of breast: Secondary | ICD-10-CM

## 2020-08-19 NOTE — Telephone Encounter (Signed)
Patient would like to have bone density test completed. An order is needed. Fax # is [phone].  She is aware that PCP out of the office until 10/14  Please advise if okay for order

## 2020-08-21 NOTE — Telephone Encounter (Signed)
Please inform patient: I have received a request for a bone density.  She has her upcoming Medicare wellness appointment next week-this and all her other preventative health maintenance screenings will be discussed at that visit, and orders will be placed.   Thanks

## 2020-08-21 NOTE — Telephone Encounter (Signed)
Patient advised and voiced understanding.  

## 2020-08-27 ENCOUNTER — Telehealth: Payer: Self-pay

## 2020-08-27 ENCOUNTER — Ambulatory Visit (INDEPENDENT_AMBULATORY_CARE_PROVIDER_SITE_OTHER): Payer: PPO

## 2020-08-27 VITALS — Ht 64.0 in | Wt 161.0 lb

## 2020-08-27 DIAGNOSIS — Z780 Asymptomatic menopausal state: Secondary | ICD-10-CM

## 2020-08-27 DIAGNOSIS — Z8639 Personal history of other endocrine, nutritional and metabolic disease: Secondary | ICD-10-CM

## 2020-08-27 DIAGNOSIS — Z0000 Encounter for general adult medical examination without abnormal findings: Secondary | ICD-10-CM | POA: Diagnosis not present

## 2020-08-27 NOTE — Patient Instructions (Signed)
Jenny Hicks , Thank you for taking time to complete your Medicare Wellness Visit. I appreciate your ongoing commitment to your health goals. Please review the following plan we discussed and let me know if I can assist you in the future.   Screening recommendations/referrals: Colonoscopy: Completed 01/07/2011- Due 01/06/2021 Mammogram: Scheduled for 11/05/20 Bone Density: Ordered today. Someone will be callinng you to schedule. Recommended yearly ophthalmology/optometry visit for glaucoma screening and checkup Recommended yearly dental visit for hygiene and checkup  Vaccinations: Influenza vaccine: Due- May obtain vaccine at our office or your local pharmacy. Pneumococcal vaccine: Completed vaccines Tdap vaccine: Up to Date- Due-12/16/2027 Shingles vaccine: Discuss with pharmacy  Covid-19:Completed vaccines  Advanced directives: Information mailed today  Conditions/risks identified: See problem list  Next appointment: Follow up in one year for your annual wellness visit    Preventive Care 65 Years and Older, Female Preventive care refers to lifestyle choices and visits with your health care provider that can promote health and wellness. What does preventive care include?  A yearly physical exam. This is also called an annual well check.  Dental exams once or twice a year.  Routine eye exams. Ask your health care provider how often you should have your eyes checked.  Personal lifestyle choices, including:  Daily care of your teeth and gums.  Regular physical activity.  Eating a healthy diet.  Avoiding tobacco and drug use.  Limiting alcohol use.  Practicing safe sex.  Taking low-dose aspirin every day.  Taking vitamin and mineral supplements as recommended by your health care provider. What happens during an annual well check? The services and screenings done by your health care provider during your annual well check will depend on your age, overall health, lifestyle risk  factors, and family history of disease. Counseling  Your health care provider may ask you questions about your:  Alcohol use.  Tobacco use.  Drug use.  Emotional well-being.  Home and relationship well-being.  Sexual activity.  Eating habits.  History of falls.  Memory and ability to understand (cognition).  Work and work Statistician.  Reproductive health. Screening  You may have the following tests or measurements:  Height, weight, and BMI.  Blood pressure.  Lipid and cholesterol levels. These may be checked every 5 years, or more frequently if you are over 7 years old.  Skin check.  Lung cancer screening. You may have this screening every year starting at age 75 if you have a 30-pack-year history of smoking and currently smoke or have quit within the past 15 years.  Fecal occult blood test (FOBT) of the stool. You may have this test every year starting at age 55.  Flexible sigmoidoscopy or colonoscopy. You may have a sigmoidoscopy every 5 years or a colonoscopy every 10 years starting at age 72.  Hepatitis C blood test.  Hepatitis B blood test.  Sexually transmitted disease (STD) testing.  Diabetes screening. This is done by checking your blood sugar (glucose) after you have not eaten for a while (fasting). You may have this done every 1-3 years.  Bone density scan. This is done to screen for osteoporosis. You may have this done starting at age 45.  Mammogram. This may be done every 1-2 years. Talk to your health care provider about how often you should have regular mammograms. Talk with your health care provider about your test results, treatment options, and if necessary, the need for more tests. Vaccines  Your health care provider may recommend certain vaccines, such as:  Influenza vaccine. This is recommended every year.  Tetanus, diphtheria, and acellular pertussis (Tdap, Td) vaccine. You may need a Td booster every 10 years.  Zoster vaccine. You  may need this after age 79.  Pneumococcal 13-valent conjugate (PCV13) vaccine. One dose is recommended after age 25.  Pneumococcal polysaccharide (PPSV23) vaccine. One dose is recommended after age 69. Talk to your health care provider about which screenings and vaccines you need and how often you need them. This information is not intended to replace advice given to you by your health care provider. Make sure you discuss any questions you have with your health care provider. Document Released: 11/21/2015 Document Revised: 07/14/2016 Document Reviewed: 08/26/2015 Elsevier Interactive Patient Education  2017 [place] Prevention in the Home Falls can cause injuries. They can happen to people of all ages. There are many things you can do to make your home safe and to help prevent falls. What can I do on the outside of my home?  Regularly fix the edges of walkways and driveways and fix any cracks.  Remove anything that might make you trip as you walk through a door, such as a raised step or threshold.  Trim any bushes or trees on the path to your home.  Use bright outdoor lighting.  Clear any walking paths of anything that might make someone trip, such as rocks or tools.  Regularly check to see if handrails are loose or broken. Make sure that both sides of any steps have handrails.  Any raised decks and porches should have guardrails on the edges.  Have any leaves, snow, or ice cleared regularly.  Use sand or salt on walking paths during winter.  Clean up any spills in your garage right away. This includes oil or grease spills. What can I do in the bathroom?  Use night lights.  Install grab bars by the toilet and in the tub and shower. Do not use towel bars as grab bars.  Use non-skid mats or decals in the tub or shower.  If you need to sit down in the shower, use a plastic, non-slip stool.  Keep the floor dry. Clean up any water that spills on the floor as soon as  it happens.  Remove soap buildup in the tub or shower regularly.  Attach bath mats securely with double-sided non-slip rug tape.  Do not have throw rugs and other things on the floor that can make you trip. What can I do in the bedroom?  Use night lights.  Make sure that you have a light by your bed that is easy to reach.  Do not use any sheets or blankets that are too big for your bed. They should not hang down onto the floor.  Have a firm chair that has side arms. You can use this for support while you get dressed.  Do not have throw rugs and other things on the floor that can make you trip. What can I do in the kitchen?  Clean up any spills right away.  Avoid walking on wet floors.  Keep items that you use a lot in easy-to-reach places.  If you need to reach something above you, use a strong step stool that has a grab bar.  Keep electrical cords out of the way.  Do not use floor polish or wax that makes floors slippery. If you must use wax, use non-skid floor wax.  Do not have throw rugs and other things on the floor that  can make you trip. What can I do with my stairs?  Do not leave any items on the stairs.  Make sure that there are handrails on both sides of the stairs and use them. Fix handrails that are broken or loose. Make sure that handrails are as long as the stairways.  Check any carpeting to make sure that it is firmly attached to the stairs. Fix any carpet that is loose or worn.  Avoid having throw rugs at the top or bottom of the stairs. If you do have throw rugs, attach them to the floor with carpet tape.  Make sure that you have a light switch at the top of the stairs and the bottom of the stairs. If you do not have them, ask someone to add them for you. What else can I do to help prevent falls?  Wear shoes that:  Do not have high heels.  Have rubber bottoms.  Are comfortable and fit you well.  Are closed at the toe. Do not wear sandals.  If  you use a stepladder:  Make sure that it is fully opened. Do not climb a closed stepladder.  Make sure that both sides of the stepladder are locked into place.  Ask someone to hold it for you, if possible.  Clearly mark and make sure that you can see:  Any grab bars or handrails.  First and last steps.  Where the edge of each step is.  Use tools that help you move around (mobility aids) if they are needed. These include:  Canes.  Walkers.  Scooters.  Crutches.  Turn on the lights when you go into a dark area. Replace any light bulbs as soon as they burn out.  Set up your furniture so you have a clear path. Avoid moving your furniture around.  If any of your floors are uneven, fix them.  If there are any pets around you, be aware of where they are.  Review your medicines with your doctor. Some medicines can make you feel dizzy. This can increase your chance of falling. Ask your doctor what other things that you can do to help prevent falls. This information is not intended to replace advice given to you by your health care provider. Make sure you discuss any questions you have with your health care provider. Document Released: 08/21/2009 Document Revised: 04/01/2016 Document Reviewed: 11/29/2014 Elsevier Interactive Patient Education  2017 [employer].

## 2020-08-27 NOTE — Progress Notes (Signed)
Subjective:   Jenny Hicks is a 69 y.o. female who presents for Medicare Annual (Subsequent) preventive examination.  I connected with Ailah today by telephone and verified that I am speaking with the correct person using two identifiers. Location patient: home Location provider: work Persons participating in the virtual visit: patient, Marine scientist.    I discussed the limitations, risks, security and privacy concerns of performing an evaluation and management service by telephone and the availability of in person appointments. I also discussed with the patient that there may be a patient responsible charge related to this service. The patient expressed understanding and verbally consented to this telephonic visit.    Interactive audio and video telecommunications were attempted between this provider and patient, however failed, due to patient having technical difficulties OR patient did not have access to video capability.  We continued and completed visit with audio only.  Some vital signs may be absent or patient reported.   Time Spent with patient on telephone encounter: 25 minutes Review of Systems     Cardiac Risk Factors include: advanced age (>33men, >60 women);dyslipidemia;sedentary lifestyle     Objective:    Today's Vitals   08/27/20 1531 08/27/20 1532  Weight: 161 lb (73 kg)   Height: 5\' 4"  (1.626 m)   PainSc:  5    Body mass index is 27.64 kg/m.  Advanced Directives 08/27/2020 11/28/2018  Does Patient Have a Medical Advance Directive? No No  Would patient like information on creating a medical advance directive? No - Patient declined Yes (MAU/Ambulatory/Procedural Areas - Information given)    Current Medications (verified) Outpatient Encounter Medications as of 08/27/2020  Medication Sig  . Calcium Carb-Cholecalciferol (CALCIUM 600 + D PO) Take 600 mg by mouth daily. Vit D 800 units  . metoprolol tartrate (LOPRESSOR) 25 MG tablet Take 25 mg by mouth daily. 1/2  tablet daily (12.5mg  daily)  . Multiple Vitamins-Minerals (CENTRUM PO) Take by mouth daily.  . Vitamin D, Cholecalciferol, 25 MCG (1000 UT) TABS Take by mouth. 2,000 units 3x a week  . cyclobenzaprine (FLEXERIL) 5 MG tablet Take 1 tablet (5 mg total) by mouth 2 (two) times daily as needed for muscle spasms. (Patient not taking: Reported on 08/27/2020)  . meloxicam (MOBIC) 7.5 MG tablet Take 1 tablet (7.5 mg total) by mouth daily.   No facility-administered encounter medications on file as of 08/27/2020.    Allergies (verified) Hydrocodone-acetaminophen and Pantoprazole sodium   History: Past Medical History:  Diagnosis Date  . Bone spur of right foot   . Chicken pox   . DDD (degenerative disc disease), cervical   . GERD (gastroesophageal reflux disease)   . Heart palpitations   . History of nuclear stress test 03/2013   stress myoview; normal study; SVT (HR 200) - adenosine  . 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 Mitral valve prolapse    a. 03/2013 Echo: EF 55-60%, mild MVP, mild to mod MR.  . Osteopenia 2017  . Scoliosis   . SVT (supraventricular tachycardia) ([place])    a. 03/2013 occurred during ex MV-->resolved with adenosine and carotid massage.   Past Surgical History:  Procedure Laterality Date  . COLONOSCOPY  01/12/2011  . DIAGNOSTIC MAMMOGRAM  08/06/2014   digital  . TRANSTHORACIC ECHOCARDIOGRAM  03/2013   EF 55-60%, mild conc hypertrophy; mild MVP, mild-mod MR  . VAGINAL HYSTERECTOMY  1989   partial; pt has both ovaries   Family History  Problem Relation Age of Onset  . Heart attack  Mother   . Hypertension Mother        Alzheimer's  . Stroke Mother        Afib  . Breast cancer Mother   . Osteoporosis Mother        cervical/femur fracture  . Myasthenia gravis Father   . Prostate cancer Father   . Prostate cancer Brother   . Arthritis Maternal Aunt   . Arthritis Maternal Uncle   . Prostate cancer Maternal Uncle   . Early death Maternal Grandmother        died after  02[birth year]13[birth year]23 baby  . Heart disease Maternal Grandfather   . Congestive Heart Failure Maternal Grandfather   . Ovarian cancer Paternal Grandmother   . Other Daughter        mitral valve damaged had to be repaired, cause unknown  . Breast cancer Cousin   . Colon cancer Neg Hx   . Esophageal cancer Neg Hx   . Rectal cancer Neg Hx    Social History   Socioeconomic History  . Marital status: Married    Spouse name: Richardson Landry  . Number of children: 5  . Years of education: 69  . Highest education level: Not on file  Occupational History  . Occupation: retired Therapist, sports  Tobacco Use  . Smoking status: Never Smoker  . Smokeless tobacco: Never Used  Vaping Use  . Vaping Use: Never used  Substance and Sexual Activity  . Alcohol use: No  . Drug use: No  . Sexual activity: Yes    Partners: Male    Comment: married  Other Topics Concern  . Not on file  Social History Narrative   Married to [place], they have 5 children.    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   Drinks caffeine, takes a daily vitamin   Wears seatbelt, smoke detector in the home, no firearms in the home.    Feels safe in her relationships.    Social Determinants of Health   Financial Resource Strain: Low Risk   . Difficulty of Paying Living Expenses: Not hard at all  Food Insecurity: No Food Insecurity  . Worried About Charity fundraiser in the Last Year: Never true  . Ran Out of Food in the Last Year: Never true  Transportation Needs: No Transportation Needs  . Lack of Transportation (Medical): No  . Lack of Transportation (Non-Medical): No  Physical Activity: Inactive  . Days of Exercise per Week: 0 days  . Minutes of Exercise per Session: 0 min  Stress: No Stress Concern Present  . Feeling of Stress : Not at all  Social Connections: Moderately Integrated  . Frequency of Communication with Friends and Family: More than three times a week  . Frequency of Social Gatherings with Friends and Family:  More than three times a week  . Attends Religious Services: 1 to 4 times per year  . Active Member of Clubs or Organizations: No  . Attends Archivist Meetings: Never  . Marital Status: Married    Tobacco Counseling Counseling given: Not Answered   Clinical Intake:  Pre-visit preparation completed: Yes  Pain : 0-10 Pain Score: 5  Pain Type: Chronic pain Pain Location: Neck Pain Onset: More than a month ago Pain Frequency: Constant     Nutritional Status: BMI 25 -29 Overweight Nutritional Risks: None Diabetes: No  How often do you need to have someone help you when you read instructions, pamphlets,  or other written materials from your doctor or pharmacy?: 1 - Never What is the last grade level you completed in school?: BS in nursing  Diabetic?No  Interpreter Needed?: No  Information entered by :: Caroleen Hamman LPN   Activities of Daily Living In your present state of health, do you have any difficulty performing the following activities: 08/27/2020  Hearing? N  Vision? N  Difficulty concentrating or making decisions? N  Walking or climbing stairs? N  Dressing or bathing? N  Doing errands, shopping? N  Preparing Food and eating ? N  Using the Toilet? N  In the past six months, have you accidently leaked urine? N  Do you have problems with loss of bowel control? N  Managing your Medications? N  Managing your Finances? N  Housekeeping or managing your Housekeeping? N  Some recent data might be hidden    Patient Care Team: Ma Hillock, DO as PCP - General (Family Medicine) Debara Pickett Nadean Corwin, MD as Consulting Physician (Cardiology) Vania Rea, MD as Consulting Physician (Obstetrics and Gynecology) Newt Minion, MD as Consulting Physician (Orthopedic Surgery)  Indicate any recent Medical Services you may have received from other than [facility] providers in the past year (date may be approximate).     Assessment:   This is a routine wellness  examination for Dillyn.  Hearing/Vision screen  Hearing Screening   125Hz  250Hz  500Hz  1000Hz  2000Hz  3000Hz  4000Hz  6000Hz  8000Hz   Right ear:           Left ear:           Comments: No hearing loss  Vision Screening Comments: Wears glasses Last eye exam-07/2020- Dr. Nicki Reaper  Dietary issues and exercise activities discussed: Current Exercise Habits: The patient does not participate in regular exercise at present, Exercise limited by: None identified  Goals    . Weight (lb) < 155 lb (70.3 kg)     Lose weight by increasing activity and decreasing snacking.       Depression Screen PHQ 2/9 Scores 08/27/2020 01/01/2020 12/21/2018 11/28/2018 12/15/2017 10/08/2016  PHQ - 2 Score 0 0 0 0 0 0    Fall Risk Fall Risk  08/27/2020 01/01/2020 11/28/2018 09/13/2018 12/15/2017  Falls in the past year? 0 1 1 0 No  Number falls in past yr: 0 0 0 - -  Injury with Fall? 0 1 1 - -  Risk for fall due to : - - Other (Comment) - -  Risk for fall due to: Comment - - walked backward - -  Follow up Falls prevention discussed Falls evaluation completed;Education provided;Falls prevention discussed Falls prevention discussed Falls evaluation completed -    Any stairs in or around the home? No  Home free of loose throw rugs in walkways, pet beds, electrical cords, etc? Yes  Adequate lighting in your home to reduce risk of falls? Yes   ASSISTIVE DEVICES UTILIZED TO PREVENT FALLS:  Life alert? No  Use of a cane, walker or w/c? No  Grab bars in the bathroom? Yes  Shower chair or bench in shower? No  Elevated toilet seat or a handicapped toilet? No   TIMED UP AND GO:  Was the test performed? No . Phone visit   Cognitive Function:No cognitive impairment noted MMSE - Mini Mental State Exam 11/28/2018  Orientation to time 5  Orientation to Place 5  Registration 3  Attention/ Calculation 5  Recall 2  Language- name 2 objects 2  Language- repeat 1  Language- follow 3  step command 3  Language- read & follow  direction 1  Write a sentence 1  Copy design 1  Total score 29        Immunizations Immunization History  Administered Date(s) Administered  . Influenza Split 08/26/2011, 08/28/2012  . Influenza, High Dose Seasonal PF 07/18/2018, 09/03/2019  . Influenza-Unspecified 08/24/2014, 09/01/2015, 07/25/2016, 08/22/2017, 09/03/2019  . PFIZER SARS-COV-2 Vaccination 12/17/2019, 01/09/2020  . Pneumococcal Conjugate-13 10/08/2016  . Pneumococcal Polysaccharide-23 03/15/2013, 12/15/2017  . Td 12/15/2017  . Tdap 11/09/2007  . Zoster 11/06/2011    TDAP status: Up to date   Flu vaccine status: Due- Patient plans to obtain in the near future  Pneumococcal vaccine status: Up to date   Covid-19 vaccine status: Completed vaccines  Qualifies for Shingles Vaccine? Yes   Zostavax completed Yes   Shingrix Completed?: No.    Education has been provided regarding the importance of this vaccine. Patient has been advised to call insurance company to determine out of pocket expense if they have not yet received this vaccine. Advised may also receive vaccine at local pharmacy or Health Dept. Verbalized acceptance and understanding.  Screening Tests Health Maintenance  Topic Date Due  . INFLUENZA VACCINE  06/08/2020  . DEXA SCAN  07/14/2020  . COLONOSCOPY  01/06/2021  . MAMMOGRAM  10/21/2021  . TETANUS/TDAP  12/16/2027  . COVID-19 Vaccine  Completed  . Hepatitis C Screening  Completed  . PNA vac Low Risk Adult  Completed    Health Maintenance  Health Maintenance Due  Topic Date Due  . INFLUENZA VACCINE  06/08/2020  . DEXA SCAN  07/14/2020    Colorectal cancer screening: Completed 01/07/2011. Repeat every 10 years   Mammogram status: Completed 10/22/2019. Repeat every year   Bone Density status: Ordered today. Pt provided with contact info and advised to call to schedule appt.  Lung Cancer Screening: (Low Dose CT Chest recommended if Age 64-80 years, 30 pack-year currently smoking OR have  quit w/in 15years.) does not qualify.     Additional Screening:  Hepatitis C Screening: Completed 10/08/2016  Vision Screening: Recommended annual ophthalmology exams for early detection of glaucoma and other disorders of the eye. Is the patient up to date with their annual eye exam?  Yes  Who is the provider or what is the name of the office in which the patient attends annual eye exams? Dr.Scott   Dental Screening: Recommended annual dental exams for proper oral hygiene  Community Resource Referral / Chronic Care Management: CRR required this visit?  No   CCM required this visit?  No      Plan:     I have personally reviewed and noted the following in the patient's chart:   . Medical and social history . Use of alcohol, tobacco or illicit drugs  . Current medications and supplements . Functional ability and status . Nutritional status . Physical activity . Advanced directives . List of other physicians . Hospitalizations, surgeries, and ER visits in previous 12 months . Vitals . Screenings to include cognitive, depression, and falls . Referrals and appointments  In addition, I have reviewed and discussed with patient certain preventive protocols, quality metrics, and best practice recommendations. A written personalized care plan for preventive services as well as general preventive health recommendations were provided to patient.   Due to this being a telephonic visit, the after visit summary with patients personalized plan was offered to patient via mail or my-chart. Patient would like to access on my-chart.  Jeanie Cooks  [place], [place]   29/02/7532  Nurse Health Advisor  Nurse Notes: None

## 2020-08-27 NOTE — Telephone Encounter (Signed)
During Medicare Wellness visit, patient requested refills of Flexeril. She states she is still having some issues wiih her neck.

## 2020-09-30 ENCOUNTER — Telehealth (INDEPENDENT_AMBULATORY_CARE_PROVIDER_SITE_OTHER): Payer: PPO | Admitting: Physician Assistant

## 2020-09-30 ENCOUNTER — Encounter: Payer: Self-pay | Admitting: Physician Assistant

## 2020-09-30 VITALS — BP 131/79 | HR 61 | Ht 64.0 in | Wt 162.0 lb

## 2020-09-30 DIAGNOSIS — I456 Pre-excitation syndrome: Secondary | ICD-10-CM | POA: Diagnosis not present

## 2020-09-30 DIAGNOSIS — I471 Supraventricular tachycardia: Secondary | ICD-10-CM

## 2020-09-30 NOTE — Telephone Encounter (Signed)
This encounter was created in error - please disregard.

## 2020-09-30 NOTE — Progress Notes (Signed)
Virtual Visit via Telephone Note   This visit type was conducted due to national recommendations for restrictions regarding the COVID-19 Pandemic (e.g. social distancing) in an effort to limit this patient's exposure and mitigate transmission in our community.  Due to her co-morbid illnesses, this patient is at least at moderate risk for complications without adequate follow up.  This format is felt to be most appropriate for this patient at this time.  The patient did not have access to video technology/had technical difficulties with video requiring transitioning to audio format only (telephone).  All issues noted in this document were discussed and addressed.  No physical exam could be performed with this format.  Please refer to the patient's chart for her  consent to telehealth for [facility].    Date:  10/02/2020   ID:  Jenny Hicks, DOB 1951/06/11, MRN [id number] The patient was identified using 2 identifiers.  Patient Location: Home Provider Location: Home Office  PCP:  Ma Hillock, DO  Cardiologist:  Pixie Casino, MD  Electrophysiologist:  None   Evaluation Performed:  Follow-Up Visit  Chief Complaint:  Follow up  History of Present Illness:    Jenny Hicks is a 69 y.o. female with past medical history of palpitation, Lown Ganong Levine syndrome, mitral valve prolapse and SVT.  Echocardiogram performed in May 2014 showed EF 55 to 60%, mild mitral valve prolapse, mild to moderate MR.  Treadmill stress test in May 2014 reviewed exercise-induced SVT with heart rate of 200, 1 mm ST depression.  She received 6 mg of adenosine and carotid massage which broke the arrhythmia.  She was started on low-dose Toprol-XL for rate control.  She was lost to follow-up for a long time and then reestablished in 2020 with Dr. Debara Pickett.  After her beta-blocker was switched to diltiazem, she had less fatigue.  She also had a repeat echocardiogram in [place] around October 2020 that  showed normal systolic function, mild MR and TR. heart monitor at the time showed more than 1000 episode of short SVT.  Longest episode was 2-minute and 14 seconds.  She wished to have follow-up in both [place] with Dr. Debara Pickett and and [place] with Dr. Patrice Paradise.  Patient presents today for virtual visit.  She says, her SVT has been better controlled this year when compared to the last year.  She is not taking metoprolol twice a day, instead she is only taking half a tablet of metoprolol before bedtime every day.  This seems to be be controlling her palpitation quite well.  She does feel sometimes her arms goes to sleep at night.  I suspect this is related to a compressed nerve in her neck.  Overall, she has been doing well from cardiac perspective without chest pain or shortness of breath and can follow-up in 1 year.  The patient does not have symptoms concerning for COVID-19 infection (fever, chills, cough, or new shortness of breath).    Past Medical History:  Diagnosis Date  . Bone spur of right foot   . Chicken pox   . DDD (degenerative disc disease), cervical   . GERD (gastroesophageal reflux disease)   . Heart palpitations   . History of nuclear stress test 03/2013   stress myoview; normal study; SVT (HR 200) - adenosine  . 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 Mitral valve prolapse    a. 03/2013 Echo: EF 55-60%, mild MVP, mild to mod MR.  . Osteopenia 2017  .  Scoliosis   . SVT (supraventricular tachycardia) ([place])    a. 03/2013 occurred during ex MV-->resolved with adenosine and carotid massage.   Past Surgical History:  Procedure Laterality Date  . COLONOSCOPY  01/12/2011  . DIAGNOSTIC MAMMOGRAM  08/06/2014   digital  . TRANSTHORACIC ECHOCARDIOGRAM  03/2013   EF 55-60%, mild conc hypertrophy; mild MVP, mild-mod MR  . VAGINAL HYSTERECTOMY  1989   partial; pt has both ovaries     Current Meds  Medication Sig  . Calcium Carb-Cholecalciferol (CALCIUM 600 + D PO) Take 600 mg by mouth  daily. Vit D 800 units  . cyclobenzaprine (FLEXERIL) 5 MG tablet Take 1 tablet (5 mg total) by mouth 2 (two) times daily as needed for muscle spasms.  . metoprolol tartrate (LOPRESSOR) 25 MG tablet Take 25 mg by mouth daily. 1/2 tablet daily (12.5mg  daily)  . Multiple Vitamins-Minerals (CENTRUM PO) Take by mouth daily.  . Vitamin D, Cholecalciferol, 25 MCG (1000 UT) TABS Take by mouth. 2,000 units 3x a week     Allergies:   Hydrocodone-acetaminophen and Pantoprazole sodium   Social History   Tobacco Use  . Smoking status: Never Smoker  . Smokeless tobacco: Never Used  Vaping Use  . Vaping Use: Never used  Substance Use Topics  . Alcohol use: No  . Drug use: No     Family Hx: The patient's family history includes Arthritis in her maternal aunt and maternal uncle; Breast cancer in her cousin and mother; Congestive Heart Failure in her maternal grandfather; Early death in her maternal grandmother; Heart attack in her mother; Heart disease in her maternal grandfather; Hypertension in her mother; Myasthenia gravis in her father; Osteoporosis in her mother; Other in her daughter; Ovarian cancer in her paternal grandmother; Prostate cancer in her brother, father, and maternal uncle; Stroke in her mother. There is no history of Colon cancer, Esophageal cancer, or Rectal cancer.  ROS:   Please see the history of present illness.     All other systems reviewed and are negative.   Prior CV studies:   The following studies were reviewed today:  Myoview 09/22/2016  The left ventricular ejection fraction is normal (55-65%).  Nuclear stress EF: 59%.  Blood pressure demonstrated a normal response to exercise.  Upsloping ST segment depression ST segment depression was noted during stress in the II, III and aVF leads.  The study is normal.  This is a low risk study.   Echocardiogram obtained in 2020 at outside facility showed normal EF, mild MR and TR   Labs/Other Tests and Data  Reviewed:    EKG:  An ECG dated 09/12/2019 was personally reviewed today and demonstrated:  Sinus rhythm with single PAC, no significant ST-T wave changes.  Recent Labs: 01/01/2020: ALT 16; BUN 18; Creatinine, Ser 0.71; Hemoglobin 13.7; Platelets 249.0; Potassium 4.3; Sodium 140; TSH 1.99   Recent Lipid Panel Lab Results  Component Value Date/Time   CHOL 194 01/01/2020 09:00 AM   TRIG 109.0 01/01/2020 09:00 AM   HDL 51.20 01/01/2020 09:00 AM   CHOLHDL 4 01/01/2020 09:00 AM   LDLCALC 121 (H) 01/01/2020 09:00 AM    Wt Readings from Last 3 Encounters:  09/30/20 162 lb (73.5 kg)  08/27/20 161 lb (73 kg)  03/25/20 163 lb 6 oz (74.1 kg)     Risk Assessment/Calculations:      Objective:    Vital Signs:  BP 131/79   Pulse 61   Ht 5\' 4"  (1.626 m)   Wt  162 lb (73.5 kg)   BMI 27.81 kg/m    VITAL SIGNS:  reviewed  ASSESSMENT & PLAN:    1. History of exercise-induced SVT: Well-controlled on metoprolol 12.5 mg every night.  We will continue on the current therapy  2. History of Lown Jerilynn Birkenhead syndrome: Asymptomatic   Shared Decision Making/Informed Consent        COVID-19 Education: The signs and symptoms of COVID-19 were discussed with the patient and how to seek care for testing (follow up with PCP or arrange E-visit).  The importance of social distancing was discussed today.  Time:   Today, I have spent 15 minutes with the patient with telehealth technology discussing the above problems.     Medication Adjustments/Labs and Tests Ordered: Current medicines are reviewed at length with the patient today.  Concerns regarding medicines are outlined above.   Tests Ordered: No orders of the defined types were placed in this encounter.   Medication Changes: No orders of the defined types were placed in this encounter.   Follow Up:  In Person in 1 year(s)  Signed, Almyra Deforest, [place]  10/02/2020 11:48 PM    [facility]

## 2020-09-30 NOTE — Telephone Encounter (Signed)
  Patient Consent for Virtual Visit         Jenny Hicks has provided verbal consent on 09/30/2020 for a virtual visit (video or telephone).   CONSENT FOR VIRTUAL VISIT FOR:  Jenny Hicks  By participating in this virtual visit I agree to the following:  I hereby voluntarily request, consent and authorize [facility] and its employed or contracted physicians, physician assistants, nurse practitioners or other licensed health care professionals (the Practitioner), to provide me with telemedicine health care services (the "Services") as deemed necessary by the treating Practitioner. I acknowledge and consent to receive the Services by the Practitioner via telemedicine. I understand that the telemedicine visit will involve communicating with the Practitioner through live audiovisual communication technology and the disclosure of certain medical information by electronic transmission. I acknowledge that I have been given the opportunity to request an in-person assessment or other available alternative prior to the telemedicine visit and am voluntarily participating in the telemedicine visit.  I understand that I have the right to withhold or withdraw my consent to the use of telemedicine in the course of my care at any time, without affecting my right to future care or treatment, and that the Practitioner or I may terminate the telemedicine visit at any time. I understand that I have the right to inspect all information obtained and/or recorded in the course of the telemedicine visit and may receive copies of available information for a reasonable fee.  I understand that some of the potential risks of receiving the Services via telemedicine include:  Marland Kitchen Delay or interruption in medical evaluation due to technological equipment failure or disruption; . Information transmitted may not be sufficient (e.g. poor resolution of images) to allow for appropriate medical decision making by the  Practitioner; and/or  . In rare instances, security protocols could fail, causing a breach of personal health information.  Furthermore, I acknowledge that it is my responsibility to provide information about my medical history, conditions and care that is complete and accurate to the best of my ability. I acknowledge that Practitioner's advice, recommendations, and/or decision may be based on factors not within their control, such as incomplete or inaccurate data provided by me or distortions of diagnostic images or specimens that may result from electronic transmissions. I understand that the practice of medicine is not an exact science and that Practitioner makes no warranties or guarantees regarding treatment outcomes. I acknowledge that a copy of this consent can be made available to me via my patient portal ([facility]), or I can request a printed copy by calling the office of [facility].    I understand that my insurance will be billed for this visit.   I have read or had this consent read to me. . I understand the contents of this consent, which adequately explains the benefits and risks of the Services being provided via telemedicine.  . I have been provided ample opportunity to ask questions regarding this consent and the Services and have had my questions answered to my satisfaction. . I give my informed consent for the services to be provided through the use of telemedicine in my medical care

## 2020-09-30 NOTE — Patient Instructions (Signed)
Medication Instructions:  Your physician recommends that you continue on your current medications as directed. Please refer to the Current Medication list given to you today.  *If you need a refill on your cardiac medications before your next appointment, please call your pharmacy*  Lab Work: NONE ordered at this time of appointment   If you have labs (blood work) drawn today and your tests are completely normal, you will receive your results only by: . MyChart Message (if you have MyChart) OR . A paper copy in the mail If you have any lab test that is abnormal or we need to change your treatment, we will call you to review the results.  Testing/Procedures: NONE ordered at this time of appointment   Follow-Up: At [facility], you and your health needs are our priority.  As part of our continuing mission to provide you with exceptional heart care, we have created designated Provider Care Teams.  These Care Teams include your primary Cardiologist (physician) and Advanced Practice Providers (APPs -  Physician Assistants and Nurse Practitioners) who all work together to provide you with the care you need, when you need it.  Your next appointment:   1 year(s)  The format for your next appointment:   In Person  Provider:   K. Chad Hilty, MD  Other Instructions   

## 2020-10-06 DIAGNOSIS — K5790 Diverticulosis of intestine, part unspecified, without perforation or abscess without bleeding: Secondary | ICD-10-CM

## 2020-10-06 DIAGNOSIS — K529 Noninfective gastroenteritis and colitis, unspecified: Secondary | ICD-10-CM | POA: Diagnosis not present

## 2020-10-06 DIAGNOSIS — K5730 Diverticulosis of large intestine without perforation or abscess without bleeding: Secondary | ICD-10-CM | POA: Diagnosis not present

## 2020-10-06 DIAGNOSIS — N390 Urinary tract infection, site not specified: Secondary | ICD-10-CM | POA: Diagnosis not present

## 2020-10-06 DIAGNOSIS — K5792 Diverticulitis of intestine, part unspecified, without perforation or abscess without bleeding: Secondary | ICD-10-CM

## 2020-10-06 DIAGNOSIS — R000 Tachycardia, unspecified: Secondary | ICD-10-CM | POA: Diagnosis not present

## 2020-10-06 DIAGNOSIS — N281 Cyst of kidney, acquired: Secondary | ICD-10-CM | POA: Diagnosis not present

## 2020-10-06 DIAGNOSIS — Z20822 Contact with and (suspected) exposure to covid-19: Secondary | ICD-10-CM | POA: Diagnosis not present

## 2020-10-06 DIAGNOSIS — K760 Fatty (change of) liver, not elsewhere classified: Secondary | ICD-10-CM | POA: Diagnosis not present

## 2020-10-06 DIAGNOSIS — R110 Nausea: Secondary | ICD-10-CM | POA: Diagnosis not present

## 2020-10-06 DIAGNOSIS — R509 Fever, unspecified: Secondary | ICD-10-CM | POA: Diagnosis not present

## 2020-10-06 DIAGNOSIS — R197 Diarrhea, unspecified: Secondary | ICD-10-CM | POA: Diagnosis not present

## 2020-10-06 DIAGNOSIS — R5383 Other fatigue: Secondary | ICD-10-CM | POA: Diagnosis not present

## 2020-10-06 DIAGNOSIS — K219 Gastro-esophageal reflux disease without esophagitis: Secondary | ICD-10-CM | POA: Diagnosis not present

## 2020-10-06 DIAGNOSIS — R1030 Lower abdominal pain, unspecified: Secondary | ICD-10-CM | POA: Diagnosis not present

## 2020-10-06 HISTORY — DX: Diverticulosis of intestine, part unspecified, without perforation or abscess without bleeding: K57.90

## 2020-10-06 HISTORY — DX: Diverticulitis of intestine, part unspecified, without perforation or abscess without bleeding: K57.92

## 2020-10-06 LAB — CBC AND DIFFERENTIAL
HCT: 43 (ref 36–46)
Hemoglobin: 13.9 (ref 12.0–16.0)
Platelets: 244 (ref 150–399)
WBC: 8.2

## 2020-10-06 LAB — CBC: RBC: 4.76 (ref 3.87–5.11)

## 2020-10-26 ENCOUNTER — Other Ambulatory Visit: Payer: Self-pay | Admitting: Family Medicine

## 2020-11-05 ENCOUNTER — Ambulatory Visit: Payer: PPO

## 2020-11-08 HISTORY — PX: OTHER SURGICAL HISTORY: SHX169

## 2021-01-01 ENCOUNTER — Encounter: Payer: PPO | Admitting: Family Medicine

## 2021-01-06 ENCOUNTER — Telehealth: Payer: Self-pay | Admitting: Internal Medicine

## 2021-01-06 ENCOUNTER — Encounter: Payer: Self-pay | Admitting: Family Medicine

## 2021-01-06 ENCOUNTER — Ambulatory Visit (INDEPENDENT_AMBULATORY_CARE_PROVIDER_SITE_OTHER): Payer: PPO | Admitting: Family Medicine

## 2021-01-06 ENCOUNTER — Other Ambulatory Visit: Payer: Self-pay

## 2021-01-06 VITALS — BP 108/68 | HR 75 | Temp 98.2°F | Ht 63.0 in | Wt 159.0 lb

## 2021-01-06 DIAGNOSIS — N281 Cyst of kidney, acquired: Secondary | ICD-10-CM | POA: Diagnosis not present

## 2021-01-06 DIAGNOSIS — I471 Supraventricular tachycardia: Secondary | ICD-10-CM

## 2021-01-06 DIAGNOSIS — Z0000 Encounter for general adult medical examination without abnormal findings: Secondary | ICD-10-CM

## 2021-01-06 DIAGNOSIS — M8580 Other specified disorders of bone density and structure, unspecified site: Secondary | ICD-10-CM

## 2021-01-06 DIAGNOSIS — Z1211 Encounter for screening for malignant neoplasm of colon: Secondary | ICD-10-CM | POA: Diagnosis not present

## 2021-01-06 DIAGNOSIS — E559 Vitamin D deficiency, unspecified: Secondary | ICD-10-CM | POA: Diagnosis not present

## 2021-01-06 DIAGNOSIS — Z131 Encounter for screening for diabetes mellitus: Secondary | ICD-10-CM | POA: Diagnosis not present

## 2021-01-06 DIAGNOSIS — K529 Noninfective gastroenteritis and colitis, unspecified: Secondary | ICD-10-CM | POA: Diagnosis not present

## 2021-01-06 DIAGNOSIS — E663 Overweight: Secondary | ICD-10-CM

## 2021-01-06 DIAGNOSIS — E785 Hyperlipidemia, unspecified: Secondary | ICD-10-CM | POA: Diagnosis not present

## 2021-01-06 HISTORY — DX: Noninfective gastroenteritis and colitis, unspecified: K52.9

## 2021-01-06 MED ORDER — CYCLOBENZAPRINE HCL 5 MG PO TABS: 5.0000 mg | ORAL_TABLET | Freq: Two times a day (BID) | ORAL | 5 refills | Status: DC | PRN

## 2021-01-06 MED ORDER — ZOSTER VAC RECOMB ADJUVANTED 50 MCG/0.5ML IM SUSR: 0.5000 mL | Freq: Once | INTRAMUSCULAR | 1 refills | Status: AC

## 2021-01-06 NOTE — Telephone Encounter (Signed)
Hi Dr. Hilarie Fredrickson,  This patient would like to switch care from you  Dr. Hilarie Fredrickson. The patient was previously seen by Dr. Delfin Edis. Patient prefers to have a female provider. Patient would like to see Dr. Silverio Decamp..   Is this switch alright with you?  Thank you

## 2021-01-06 NOTE — Telephone Encounter (Signed)
Okay with me, thanks 

## 2021-01-06 NOTE — Progress Notes (Signed)
Renal ultrs  This visit occurred during the SARS-CoV-2 public health emergency.  Safety protocols were in place, including screening questions prior to the visit, additional usage of staff PPE, and extensive cleaning of exam room while observing appropriate contact time as indicated for disinfecting solutions.    Patient ID: Jenny Hicks, female  DOB: [birth year], 70 y.o.   MRN: [id number] Patient Care Team    Relationship Specialty Notifications Start End  Jenny Hillock, DO PCP - General Family Medicine  09/16/16   Jenny Casino, MD PCP - Cardiology Cardiology  09/30/20   Jenny Casino, MD Consulting Physician Cardiology  09/16/16   Jenny Rea, MD Consulting Physician Obstetrics and Gynecology  09/16/16   Jenny Minion, MD Consulting Physician Orthopedic Surgery  11/28/18   Jenny Sleigh, MD Referring Physician Gynecology  01/06/21     Chief Complaint  Patient presents with  . Annual Exam    Pt is not fasting;     Subjective: Jenny Hicks is a 70 y.o.  Female  present for CPE. All past medical history, surgical history, allergies, family history, immunizations, medications and social history were updated in the electronic medical record today. All recent labs, ED visits and hospitalizations within the last year were reviewed.  Health maintenance:  Colonoscopy: completed 01/2011, by Dr. Olevia Hicks, resutls inflammatory polyp. follow up 10 years.> referral placed Mammogram: completed:has scheduled 01/26/2021. BC- [place]. FHx present in mother (prostate and ovarian cancers also in many members of family on paternal side) Cervical cancer screening: Hysterectomy 1989 (non-cancerous reasons), routinely follows with Jenny Hicks (GYN) >> having recto-bladder prolapse.  Immunizations: td2/2019 UTD, InfluenzaUTD 2021(encouraged yearly),PNA series completed2019.zostavax completed.shingrix printed again for her. covid x3 Infectious disease screening:Hep C and  HIVcompleted DEXA:07/14/2018, Osteopenia (-1.9).Vitamin D within normal limits. Scheduled 3/21. Assistive device: none Oxygen PTW:[facility] Patient has a Dental home. Hospitalizations/ED visits: reviewed  Renal cyst: Patient was seen in outside emergency room for abdominal pain and found to have colitis.  CT abdomen incidentally resulted with right renal cyst of 3.7 cm's which is a new finding for her.  She reports her mother had a nephrectomy, secondary to cyst?.  Depression screen [facility] 2/9 01/06/2021 08/27/2020 01/01/2020 12/21/2018 11/28/2018  Decreased Interest 0 0 0 0 0  Down, Depressed, Hopeless 0 0 0 0 0  PHQ - 2 Score 0 0 0 0 0   No flowsheet data found.   Immunization History  Administered Date(s) Administered  . Influenza Split 08/26/2011, 08/28/2012  . Influenza, High Dose Seasonal PF 07/18/2018, 09/03/2019  . Influenza-Unspecified 08/24/2014, 09/01/2015, 07/25/2016, 08/22/2017, 09/03/2019, 09/09/2020  . PFIZER(Purple Top)SARS-COV-2 Vaccination 12/17/2019, 01/09/2020, 08/22/2020  . Pneumococcal Conjugate-13 10/08/2016  . Pneumococcal Polysaccharide-23 03/15/2013, 12/15/2017  . Td 12/15/2017  . Tdap 11/09/2007  . Zoster 11/06/2011   Past Medical History:  Diagnosis Date  . Bone spur of right foot   . Chicken pox   . DDD (degenerative disc disease), cervical   . GERD (gastroesophageal reflux disease)   . Heart palpitations   . History of nuclear stress test 03/2013   stress myoview; normal study; SVT (HR 200) - adenosine  . 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 Mitral valve prolapse    a. 03/2013 Echo: EF 55-60%, mild MVP, mild to mod MR.  . Osteopenia 2017  . Radial head fracture, closed 11/27/2018   11/2018 Referred to ortho Plan: Counseled the patient that she had a nondisplaced radial neck fracture and arthritic changes about her wrist.  Recommended  continued compression for symptomatic improvement.  She can continue Advil as needed for pain.  Recommend no lifting more than 5  pounds for another 3 weeks and then gradual increase to tolerance.  She can move the wrist and the elbow to tolerance  . Scoliosis   . SVT (supraventricular tachycardia) ([place])    a. 03/2013 occurred during ex MV-->resolved with adenosine and carotid massage.   Allergies  Allergen Reactions  . Hydrocodone-Acetaminophen Other (See Comments)    Made her BP go way up  . Pantoprazole Sodium Palpitations   Past Surgical History:  Procedure Laterality Date  . COLONOSCOPY  01/12/2011  . DIAGNOSTIC MAMMOGRAM  08/06/2014   digital  . TRANSTHORACIC ECHOCARDIOGRAM  03/2013   EF 55-60%, mild conc hypertrophy; mild MVP, mild-mod MR  . VAGINAL HYSTERECTOMY  1989   partial; pt has both ovaries   Family History  Problem Relation Age of Onset  . Heart attack Mother   . Hypertension Mother        Alzheimer's  . Stroke Mother        Afib  . Breast cancer Mother   . Osteoporosis Mother        cervical/femur fracture  . Myasthenia gravis Father   . Prostate cancer Father   . Prostate cancer Brother   . Arthritis Maternal Aunt   . Arthritis Maternal Uncle   . Prostate cancer Maternal Uncle   . Early death Maternal Grandmother        died after 2023/02/08 baby  . Heart disease Maternal Grandfather   . Congestive Heart Failure Maternal Grandfather   . Ovarian cancer Paternal Grandmother   . Other Daughter        mitral valve damaged had to be repaired, cause unknown  . Breast cancer Cousin   . Colon cancer Neg Hx   . Esophageal cancer Neg Hx   . Rectal cancer Neg Hx    Social History   Social History Narrative   Married to Jenny Hicks, they have 5 children.    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   Drinks caffeine, takes a daily vitamin   Wears seatbelt, smoke detector in the home, no firearms in the home.    Feels safe in her relationships.     Allergies as of 01/06/2021      Reactions   Hydrocodone-acetaminophen Other (See Comments)   Made her BP go way up    Pantoprazole Sodium Palpitations      Medication List       Accurate as of January 06, 2021  2:41 PM. If you have any questions, ask your nurse or doctor.        CALCIUM 600 + D PO Take 600 mg by mouth daily. Vit D 800 units   CENTRUM PO Take by mouth daily.   cyclobenzaprine 5 MG tablet Commonly known as: FLEXERIL Take 1 tablet (5 mg total) by mouth 2 (two) times daily as needed for muscle spasms.   Krill Oil 1000 MG Caps Take by mouth.   metoprolol tartrate 25 MG tablet Commonly known as: LOPRESSOR Take 25 mg by mouth daily. 1/2 tablet daily (12.5mg  daily)   Vitamin D (Cholecalciferol) 25 MCG (1000 UT) Tabs Take by mouth. 2,000 units 3x a week   Zoster Vaccine Adjuvanted injection Commonly known as: SHINGRIX Inject 0.5 mLs into the muscle once for 1 dose. Repeat dose in 2-6 mos. Started by: Howard Pouch, DO  All past medical history, surgical history, allergies, family history, immunizations andmedications were updated in the EMR today and reviewed under the history and medication portions of their EMR.     No results found for this or any previous visit (from the past 2160 hour(s)).  MM 3D SCREEN BREAST BILATERAL  Result Date: 10/22/2019 CLINICAL DATA:  Screening. EXAM: DIGITAL SCREENING BILATERAL MAMMOGRAM WITH TOMO AND CAD COMPARISON:  Previous exam(s). ACR Breast Density Category b: There are scattered areas of fibroglandular density. FINDINGS: There are no findings suspicious for malignancy. Images were processed with CAD. IMPRESSION: No mammographic evidence of malignancy. A result letter of this screening mammogram will be mailed directly to the patient. RECOMMENDATION: Screening mammogram in one year. (Code:SM-B-01Y) BI-RADS CATEGORY  1: Negative. Electronically Signed   By: Dorise Bullion III M.D   On: 10/22/2019 16:45    ROS: 14 pt review of systems performed and negative (unless mentioned in an HPI)  Objective: BP 108/68   Pulse 75   Temp 98.2 F  (36.8 C) (Oral)   Ht 5\' 3"  (1.6 m)   Wt 159 lb (72.1 kg)   SpO2 98%   BMI 28.17 kg/m  Gen: Afebrile. No acute distress. Nontoxic in appearance, well-developed, well-nourished,  Pleasant female.  HENT: AT. [place]. Bilateral TM visualized and normal in appearance, normal external auditory canal. MMM, no oral lesions, adequate dentition. Bilateral nares within normal limits. Throat without erythema, ulcerations or exudates. no Cough on exam, no hoarseness on exam. Eyes:Pupils Equal Round Reactive to light, Extraocular movements intact,  Conjunctiva without redness, discharge or icterus. Neck/lymp/endocrine: Supple,no lymphadenopathy, no thyromegaly CV: RRR no murmur, no edema, +2/4 P posterior tibialis pulses.  Chest: CTAB, no wheeze, rhonchi or crackles. normal Respiratory effort. good Air movement. Abd: Soft. flat. NTND. BS present. no Masses palpated. No hepatosplenomegaly. No rebound tenderness or guarding. Skin: no rashes, purpura or petechiae. Warm and well-perfused. Skin intact. Neuro/Msk:  Normal gait. PERLA. EOMi. Alert. Oriented x3.  Cranial nerves II through XII intact. Muscle strength 5/5 upper/lower extremity. DTRs equal bilaterally. Psych: Normal affect, dress and demeanor. Normal speech. Normal thought content and judgment.  No exam data present  Assessment/plan: Jenny Hicks is a 70 y.o. female present for CPE Paroxysmal SVT (supraventricular tachycardia) ([place]) Managed by cardio on metoprolol  - TSH Renal cyst: New right renal cyst.  Incidentally noted on CT abdomen.  Will obtain renal ultrasound to further evaluate cyst given her mother's history of nephrectomy. Osteopenia, unspecified location DEXA scheduled.  Hyperlipidemia, unspecified hyperlipidemia type/ Overweight (BMI 25.0-29.9) No longer taking krill oil.  - CBC with Differential/Platelet - Comprehensive metabolic panel - Lipid panel - TSH Vitamin D deficiency - VITAMIN D 25 Hydroxy (Vit-D Deficiency,  Fractures) Colon cancer screening/Colitis Recent segmental distal colon colitis November in ED. Also, due for colon cancer screen.  - Ambulatory referral to Gastroenterology Diabetes mellitus screening - Hemoglobin A1c Encounter for preventive health examination Patient was encouraged to exercise greater than 150 minutes a week. Patient was encouraged to choose a diet filled with fresh fruits and vegetables, and lean meats. AVS provided to patient today for education/recommendation on gender specific health and safety maintenance. Colonoscopy: completed 01/2011, by Dr. Olevia Hicks, resutls inflammatory polyp. follow up 10 years.> referral placed Mammogram: completed:has scheduled 01/26/2021. BC- [place]. FHx present in mother (prostate and ovarian cancers also in many members of family on paternal side) Cervical cancer screening: Hysterectomy 1989 (non-cancerous reasons), routinely follows with Jenny Hicks (GYN) >> having recto-bladder prolapse.  Immunizations:  td2/2019 UTD, InfluenzaUTD 2021(encouraged yearly),PNA series completed2019.zostavax completed.shingrix printed again for her. covid x3 Infectious disease screening:Hep C and HIVcompleted DEXA:07/14/2018, Osteopenia (-1.9).Vitamin D within normal limits. Scheduled 3/21.  Return in about 1 year (around 01/06/2022) for CPE (30 min).   Orders Placed This Encounter  Procedures  . VITAMIN D 25 Hydroxy (Vit-D Deficiency, Fractures)  . CBC with Differential/Platelet  . Comprehensive metabolic panel  . Hemoglobin A1c  . Lipid panel  . TSH  . Ambulatory referral to Gastroenterology   Meds ordered this encounter  Medications  . Zoster Vaccine Adjuvanted [facility]) injection    Sig: Inject 0.5 mLs into the muscle once for 1 dose. Repeat dose in 2-6 mos.    Dispense:  0.5 mL    Refill:  1  . cyclobenzaprine (FLEXERIL) 5 MG tablet    Sig: Take 1 tablet (5 mg total) by mouth 2 (two) times daily as needed for muscle spasms.    Dispense:  60  tablet    Refill:  5    Referral Orders     Ambulatory referral to Gastroenterology   Electronically signed by: Howard Pouch, DO [facility]

## 2021-01-06 NOTE — Patient Instructions (Signed)
Health Maintenance After Age 70 After age 70, you are at a higher risk for certain long-term diseases and infections as well as injuries from falls. Falls are a major cause of broken bones and head injuries in people who are older than age 70. Getting regular preventive care can help to keep you healthy and well. Preventive care includes getting regular testing and making lifestyle changes as recommended by your health care provider. Talk with your health care provider about:  Which screenings and tests you should have. A screening is a test that checks for a disease when you have no symptoms.  A diet and exercise plan that is right for you. What should I know about screenings and tests to prevent falls? Screening and testing are the best ways to find a health problem early. Early diagnosis and treatment give you the best chance of managing medical conditions that are common after age 70. Certain conditions and lifestyle choices may make you more likely to have a fall. Your health care provider may recommend:  Regular vision checks. Poor vision and conditions such as cataracts can make you more likely to have a fall. If you wear glasses, make sure to get your prescription updated if your vision changes.  Medicine review. Work with your health care provider to regularly review all of the medicines you are taking, including over-the-counter medicines. Ask your health care provider about any side effects that may make you more likely to have a fall. Tell your health care provider if any medicines that you take make you feel dizzy or sleepy.  Osteoporosis screening. Osteoporosis is a condition that causes the bones to get weaker. This can make the bones weak and cause them to break more easily.  Blood pressure screening. Blood pressure changes and medicines to control blood pressure can make you feel dizzy.  Strength and balance checks. Your health care provider may recommend certain tests to check your  strength and balance while standing, walking, or changing positions.  Foot health exam. Foot pain and numbness, as well as not wearing proper footwear, can make you more likely to have a fall.  Depression screening. You may be more likely to have a fall if you have a fear of falling, feel emotionally low, or feel unable to do activities that you used to do.  Alcohol use screening. Using too much alcohol can affect your balance and may make you more likely to have a fall. What actions can I take to lower my risk of falls? General instructions  Talk with your health care provider about your risks for falling. Tell your health care provider if: ? You fall. Be sure to tell your health care provider about all falls, even ones that seem minor. ? You feel dizzy, sleepy, or off-balance.  Take over-the-counter and prescription medicines only as told by your health care provider. These include any supplements.  Eat a healthy diet and maintain a healthy weight. A healthy diet includes low-fat dairy products, low-fat (lean) meats, and fiber from whole grains, beans, and lots of fruits and vegetables. Home safety  Remove any tripping hazards, such as rugs, cords, and clutter.  Install safety equipment such as grab bars in bathrooms and safety rails on stairs.  Keep rooms and walkways well-lit. Activity  Follow a regular exercise program to stay fit. This will help you maintain your balance. Ask your health care provider what types of exercise are appropriate for you.  If you need a cane or walker,   use it as recommended by your health care provider.  Wear supportive shoes that have nonskid soles.   Lifestyle  Do not drink alcohol if your health care provider tells you not to drink.  If you drink alcohol, limit how much you have: ? 0-1 drink a day for women. ? 0-2 drinks a day for men.  Be aware of how much alcohol is in your drink. In the [place] one drink equals one typical bottle of beer (12  oz), one-half glass of wine (5 oz), or one shot of hard liquor (1 oz).  Do not use any products that contain nicotine or tobacco, such as cigarettes and e-cigarettes. If you need help quitting, ask your health care provider. Summary  Having a healthy lifestyle and getting preventive care can help to protect your health and wellness after age 70.  Screening and testing are the best way to find a health problem early and help you avoid having a fall. Early diagnosis and treatment give you the best chance for managing medical conditions that are more common for people who are older than age 70.  Falls are a major cause of broken bones and head injuries in people who are older than age 70. Take precautions to prevent a fall at home.  Work with your health care provider to learn what changes you can make to improve your health and wellness and to prevent falls. This information is not intended to replace advice given to you by your health care provider. Make sure you discuss any questions you have with your health care provider. Document Revised: 02/15/2019 Document Reviewed: 09/07/2017 Elsevier Patient Education  2021 Elsevier Inc.  

## 2021-01-07 DIAGNOSIS — R8290 Unspecified abnormal findings in urine: Secondary | ICD-10-CM | POA: Diagnosis not present

## 2021-01-07 DIAGNOSIS — N8111 Cystocele, midline: Secondary | ICD-10-CM | POA: Diagnosis not present

## 2021-01-07 DIAGNOSIS — B9689 Other specified bacterial agents as the cause of diseases classified elsewhere: Secondary | ICD-10-CM | POA: Diagnosis not present

## 2021-01-07 DIAGNOSIS — N819 Female genital prolapse, unspecified: Secondary | ICD-10-CM | POA: Diagnosis not present

## 2021-01-07 DIAGNOSIS — N390 Urinary tract infection, site not specified: Secondary | ICD-10-CM | POA: Diagnosis not present

## 2021-01-07 DIAGNOSIS — B9620 Unspecified Escherichia coli [E. coli] as the cause of diseases classified elsewhere: Secondary | ICD-10-CM | POA: Diagnosis not present

## 2021-01-07 DIAGNOSIS — N813 Complete uterovaginal prolapse: Secondary | ICD-10-CM | POA: Diagnosis not present

## 2021-01-07 LAB — COMPREHENSIVE METABOLIC PANEL
AG Ratio: 1.5 (calc) (ref 1.0–2.5)
ALT: 15 U/L (ref 6–29)
AST: 18 U/L (ref 10–35)
Albumin: 4 g/dL (ref 3.6–5.1)
Alkaline phosphatase (APISO): 55 U/L (ref 37–153)
BUN: 21 mg/dL (ref 7–25)
CO2: 27 mmol/L (ref 20–32)
Calcium: 9 mg/dL (ref 8.6–10.4)
Chloride: 104 mmol/L (ref 98–110)
Creat: 0.8 mg/dL (ref 0.50–0.99)
Globulin: 2.6 g/dL (calc) (ref 1.9–3.7)
Glucose, Bld: 99 mg/dL (ref 65–99)
Potassium: 3.8 mmol/L (ref 3.5–5.3)
Sodium: 141 mmol/L (ref 135–146)
Total Bilirubin: 0.4 mg/dL (ref 0.2–1.2)
Total Protein: 6.6 g/dL (ref 6.1–8.1)

## 2021-01-07 LAB — LIPID PANEL
Cholesterol: 195 mg/dL (ref <200)
HDL: 51 mg/dL (ref >=50)
LDL Cholesterol (Calc): 112 mg/dL (calc) — ABNORMAL HIGH
Non-HDL Cholesterol (Calc): 144 mg/dL (calc) — ABNORMAL HIGH (ref <130)
Total CHOL/HDL Ratio: 3.8 (calc) (ref <5.0)
Triglycerides: 200 mg/dL — ABNORMAL HIGH (ref <150)

## 2021-01-07 LAB — CBC WITH DIFFERENTIAL/PLATELET
Absolute Monocytes: 403 cells/uL (ref 200–950)
Basophils Absolute: 20 cells/uL (ref 0–200)
Basophils Relative: 0.3 %
Eosinophils Absolute: 40 cells/uL (ref 15–500)
Eosinophils Relative: 0.6 %
HCT: 41 % (ref 35.0–45.0)
Hemoglobin: 14.3 g/dL (ref 11.7–15.5)
Lymphs Abs: 1584 cells/uL (ref 850–3900)
MCH: 31.9 pg (ref 27.0–33.0)
MCHC: 34.9 g/dL (ref 32.0–36.0)
MCV: 91.5 fL (ref 80.0–100.0)
MPV: 10.7 fL (ref 7.5–12.5)
Monocytes Relative: 6.1 %
Neutro Abs: 4554 cells/uL (ref 1500–7800)
Neutrophils Relative %: 69 %
Platelets: 299 10*3/uL (ref 140–400)
RBC: 4.48 10*6/uL (ref 3.80–5.10)
RDW: 12.5 % (ref 11.0–15.0)
Total Lymphocyte: 24 %
WBC: 6.6 10*3/uL (ref 3.8–10.8)

## 2021-01-07 LAB — HEMOGLOBIN A1C
Hgb A1c MFr Bld: 5.2 % of total Hgb (ref <5.7)
Mean Plasma Glucose: 103 mg/dL
eAG (mmol/L): 5.7 mmol/L

## 2021-01-07 LAB — VITAMIN D 25 HYDROXY (VIT D DEFICIENCY, FRACTURES): Vit D, 25-Hydroxy: 39 ng/mL (ref 30–100)

## 2021-01-07 LAB — TSH: TSH: 0.91 mIU/L (ref 0.40–4.50)

## 2021-01-07 NOTE — Telephone Encounter (Signed)
Ok, Thanks

## 2021-01-09 ENCOUNTER — Encounter: Payer: Self-pay | Admitting: Gastroenterology

## 2021-01-26 ENCOUNTER — Ambulatory Visit
Admission: RE | Admit: 2021-01-26 | Discharge: 2021-01-26 | Disposition: A | Discharge status: Home / Self Care | Payer: PPO | Source: Ambulatory Visit | Attending: Family Medicine | Admitting: Family Medicine

## 2021-01-26 ENCOUNTER — Other Ambulatory Visit: Payer: Self-pay

## 2021-01-26 ENCOUNTER — Inpatient Hospital Stay: Admission: RE | Admit: 2021-01-26 | Payer: PPO | Source: Ambulatory Visit

## 2021-01-26 DIAGNOSIS — N281 Cyst of kidney, acquired: Secondary | ICD-10-CM | POA: Diagnosis not present

## 2021-01-26 DIAGNOSIS — Z1231 Encounter for screening mammogram for malignant neoplasm of breast: Secondary | ICD-10-CM | POA: Diagnosis not present

## 2021-01-26 DIAGNOSIS — Z780 Asymptomatic menopausal state: Secondary | ICD-10-CM | POA: Diagnosis not present

## 2021-01-26 DIAGNOSIS — M85832 Other specified disorders of bone density and structure, left forearm: Secondary | ICD-10-CM | POA: Diagnosis not present

## 2021-01-26 DIAGNOSIS — M85851 Other specified disorders of bone density and structure, right thigh: Secondary | ICD-10-CM | POA: Diagnosis not present

## 2021-02-02 ENCOUNTER — Other Ambulatory Visit: Payer: Self-pay | Admitting: Internal Medicine

## 2021-02-11 DIAGNOSIS — R8290 Unspecified abnormal findings in urine: Secondary | ICD-10-CM | POA: Diagnosis not present

## 2021-02-11 DIAGNOSIS — N819 Female genital prolapse, unspecified: Secondary | ICD-10-CM | POA: Diagnosis not present

## 2021-02-11 DIAGNOSIS — N813 Complete uterovaginal prolapse: Secondary | ICD-10-CM | POA: Diagnosis not present

## 2021-02-18 DIAGNOSIS — N8189 Other female genital prolapse: Secondary | ICD-10-CM | POA: Diagnosis not present

## 2021-03-02 DIAGNOSIS — N813 Complete uterovaginal prolapse: Secondary | ICD-10-CM | POA: Diagnosis not present

## 2021-03-02 DIAGNOSIS — N736 Female pelvic peritoneal adhesions (postinfective): Secondary | ICD-10-CM | POA: Diagnosis not present

## 2021-03-02 DIAGNOSIS — K660 Peritoneal adhesions (postprocedural) (postinfection): Secondary | ICD-10-CM | POA: Diagnosis not present

## 2021-03-02 DIAGNOSIS — K429 Umbilical hernia without obstruction or gangrene: Secondary | ICD-10-CM | POA: Diagnosis not present

## 2021-03-02 DIAGNOSIS — N993 Prolapse of vaginal vault after hysterectomy: Secondary | ICD-10-CM | POA: Diagnosis not present

## 2021-03-02 DIAGNOSIS — N819 Female genital prolapse, unspecified: Secondary | ICD-10-CM | POA: Diagnosis not present

## 2021-03-02 HISTORY — PX: UMBILICAL HERNIA REPAIR: SHX2598

## 2021-03-24 ENCOUNTER — Encounter: Payer: PPO | Admitting: Gastroenterology

## 2021-04-03 ENCOUNTER — Ambulatory Visit (AMBULATORY_SURGERY_CENTER): Payer: PPO | Admitting: *Deleted

## 2021-04-03 ENCOUNTER — Other Ambulatory Visit: Payer: Self-pay

## 2021-04-03 VITALS — Ht 63.0 in | Wt 159.0 lb

## 2021-04-03 DIAGNOSIS — Z1211 Encounter for screening for malignant neoplasm of colon: Secondary | ICD-10-CM

## 2021-04-03 MED ORDER — NA SULFATE-K SULFATE-MG SULF 17.5-3.13-1.6 GM/177ML PO SOLN: ORAL | 0 refills | Status: DC

## 2021-04-03 NOTE — Progress Notes (Signed)
Patient's pre-visit was done today over the phone with the patient due to COVID-19 pandemic. Name,DOB and address verified. Insurance verified. Patient denies any allergies to Eggs and Soy. Patient denies any problems with anesthesia/sedation. Patient denies taking diet pills or blood thinners. Packet of Prep instructions mailed to patient including a copy of a consent form-pt is aware.  Patient understands to call us back with any questions or concerns. Patient is aware of our care-partner policy and LTKCX-01 safety protocol. EMMI education assigned to the patient for the procedure, sent to [facility]. The patient is COVID-19 vaccinated, per patient. Patient has post op OV on 04/08/21-she is aware to get cleared by surgeon for colonoscopy and if not call us to cancel.

## 2021-04-07 ENCOUNTER — Encounter: Payer: Self-pay | Admitting: Gastroenterology

## 2021-04-15 ENCOUNTER — Encounter: Payer: Self-pay | Admitting: Certified Registered Nurse Anesthetist

## 2021-04-16 ENCOUNTER — Ambulatory Visit (AMBULATORY_SURGERY_CENTER): Payer: PPO | Admitting: Gastroenterology

## 2021-04-16 ENCOUNTER — Other Ambulatory Visit: Payer: Self-pay

## 2021-04-16 ENCOUNTER — Encounter: Payer: Self-pay | Admitting: Gastroenterology

## 2021-04-16 VITALS — BP 109/64 | HR 80 | Temp 98.6°F | Resp 16 | Ht 63.0 in | Wt 159.0 lb

## 2021-04-16 DIAGNOSIS — D122 Benign neoplasm of ascending colon: Secondary | ICD-10-CM | POA: Diagnosis not present

## 2021-04-16 DIAGNOSIS — D125 Benign neoplasm of sigmoid colon: Secondary | ICD-10-CM

## 2021-04-16 DIAGNOSIS — Z86010 Personal history of colonic polyps: Secondary | ICD-10-CM

## 2021-04-16 DIAGNOSIS — Z1211 Encounter for screening for malignant neoplasm of colon: Secondary | ICD-10-CM

## 2021-04-16 LAB — HM COLONOSCOPY

## 2021-04-16 MED ORDER — HYDROCORTISONE ACE-PRAMOXINE 1-1 % EX CREA: 1.0000 "application " | TOPICAL_CREAM | Freq: Two times a day (BID) | CUTANEOUS | 1 refills | Status: DC

## 2021-04-16 MED ORDER — SODIUM CHLORIDE 0.9 % IV SOLN: 500.0000 mL | Freq: Once | INTRAVENOUS | Status: DC

## 2021-04-16 NOTE — Progress Notes (Signed)
Report given to PACU, vss 

## 2021-04-16 NOTE — Patient Instructions (Signed)
Thank you for allowing [place] to care for you today!  Await pathology results of polyps removed.  Will make recommendation at that time for future colonoscopy.  Resume previous diet and medications today.  Return to normal daily activities tomorrow, 04/17/2021.  Prescription sent to your pharmacy for Analpram [facility] Cream.  Apply externally twice per day for 7 days.      YOU HAD AN ENDOSCOPIC PROCEDURE TODAY AT [facility]:   Refer to the procedure report that was given to you for any specific questions about what was found during the examination.  If the procedure report does not answer your questions, please call your gastroenterologist to clarify.  If you requested that your care partner not be given the details of your procedure findings, then the procedure report has been included in a sealed envelope for you to review at your convenience later.  YOU SHOULD EXPECT: Some feelings of bloating in the abdomen. Passage of more gas than usual.  Walking can help get rid of the air that was put into your GI tract during the procedure and reduce the bloating. If you had a lower endoscopy (such as a colonoscopy or flexible sigmoidoscopy) you may notice spotting of blood in your stool or on the toilet paper. If you underwent a bowel prep for your procedure, you may not have a normal bowel movement for a few days.  Please Note:  You might notice some irritation and congestion in your nose or some drainage.  This is from the oxygen used during your procedure.  There is no need for concern and it should clear up in a day or so.  SYMPTOMS TO REPORT IMMEDIATELY:  Following lower endoscopy (colonoscopy or flexible sigmoidoscopy):  Excessive amounts of blood in the stool  Significant tenderness or worsening of abdominal pains  Swelling of the abdomen that is new, acute  Fever of 100F or higher    For urgent or emergent issues, a gastroenterologist can be reached at any hour by calling [phone]  [phone]. Do not use MyChart messaging for urgent concerns.    DIET:  We do recommend a small meal at first, but then you may proceed to your regular diet.  Drink plenty of fluids but you should avoid alcoholic beverages for 24 hours.  ACTIVITY:  You should plan to take it easy for the rest of today and you should NOT DRIVE or use heavy machinery until tomorrow (because of the sedation medicines used during the test).    FOLLOW UP: Our staff will call the number listed on your records 48-72 hours following your procedure to check on you and address any questions or concerns that you may have regarding the information given to you following your procedure. If we do not reach you, we will leave a message.  We will attempt to reach you two times.  During this call, we will ask if you have developed any symptoms of COVID 19. If you develop any symptoms (ie: fever, flu-like symptoms, shortness of breath, cough etc.) before then, please call [phone].  If you test positive for Covid 19 in the 2 weeks post procedure, please call and report this information to [place].    If any biopsies were taken you will be contacted by phone or by letter within the next 1-3 weeks.  Please call us at [phone] if you have not heard about the biopsies in 3 weeks.    SIGNATURES/CONFIDENTIALITY: You and/or your care partner have signed paperwork  which will be entered into your electronic medical record.  These signatures attest to the fact that that the information above on your After Visit Summary has been reviewed and is understood.  Full responsibility of the confidentiality of this discharge information lies with you and/or your care-partner.

## 2021-04-16 NOTE — Progress Notes (Signed)
Called to room to assist during endoscopic procedure.  Patient ID and intended procedure confirmed with present staff. Received instructions for my participation in the procedure from the performing physician.  

## 2021-04-16 NOTE — Op Note (Signed)
[facility] Patient Name: Jenny Hicks Procedure Date: 04/16/2021 7:20 AM MRN: [id number] Endoscopist: Mauri Pole , MD Age: 70 Referring MD:  Date of Birth: 1951/03/01 Gender: Female Account #: [id number] Procedure:                Colonoscopy Indications:              Screening for colorectal malignant neoplasm. >1cm                            inflammatory polyp removed in 2012 Medicines:                Monitored Anesthesia Care Procedure:                Pre-Anesthesia Assessment:                           - Prior to the procedure, a History and Physical                            was performed, and patient medications and                            allergies were reviewed. The patient's tolerance of                            previous anesthesia was also reviewed. The risks                            and benefits of the procedure and the sedation                            options and risks were discussed with the patient.                            All questions were answered, and informed consent                            was obtained. Prior Anticoagulants: The patient has                            taken no previous anticoagulant or antiplatelet                            agents. ASA Grade Assessment: III - A patient with                            severe systemic disease. After reviewing the risks                            and benefits, the patient was deemed in                            satisfactory condition to undergo the procedure.  After obtaining informed consent, the colonoscope                            was passed under direct vision. Throughout the                            procedure, the patient's blood pressure, pulse, and                            oxygen saturations were monitored continuously. The                            Olympus PCF-H190DL (#[id number]) Colonoscope was                            introduced through the  anus and advanced to the the                            cecum, identified by appendiceal orifice and                            ileocecal valve. The colonoscopy was performed                            without difficulty. The patient tolerated the                            procedure well. The quality of the bowel                            preparation was excellent. The ileocecal valve,                            appendiceal orifice, and rectum were photographed. Scope In: 8:11:04 AM Scope Out: 8:31:56 AM Scope Withdrawal Time: 0 hours 14 minutes 45 seconds  Total Procedure Duration: 0 hours 20 minutes 52 seconds  Findings:                 The perianal and digital rectal examinations were                            normal.                           A less than 1 mm polyp was found in the ascending                            colon. The polyp was sessile. The polyp was removed                            with a cold biopsy forceps. Resection and retrieval                            were complete.  Four sessile polyps were found in the sigmoid colon                            and ascending colon. The polyps were 4 to 11 mm in                            size. These polyps were removed with a cold snare.                            Resection and retrieval were complete.                           Scattered small-mouthed diverticula were found in                            the sigmoid colon.                           Non-bleeding external and internal hemorrhoids were                            found during retroflexion. The hemorrhoids were                            medium-sized. Complications:            No immediate complications. Estimated Blood Loss:     Estimated blood loss was minimal. Impression:               - One less than 1 mm polyp in the ascending colon,                            removed with a cold biopsy forceps. Resected and                             retrieved.                           - Four 4 to 11 mm polyps in the sigmoid colon and                            in the ascending colon, removed with a cold snare.                            Resected and retrieved.                           - Diverticulosis in the sigmoid colon.                           - Non-bleeding external and internal hemorrhoids. Recommendation:           - Patient has a contact number available for  emergencies. The signs and symptoms of potential                            delayed complications were discussed with the                            patient. Return to normal activities tomorrow.                            Written discharge instructions were provided to the                            patient.                           - Resume previous diet.                           - Continue present medications.                           - Await pathology results.                           - Repeat colonoscopy in 3 - 5 years for                            surveillance based on pathology results.                           - Use Analpram HC Cream 2.5%: Apply externally BID                            for 7 days. Mauri Pole, MD 04/16/2021 8:40:36 AM This report has been signed electronically.

## 2021-04-16 NOTE — Progress Notes (Signed)
Pt's states no medical or surgical changes since previsit or office visit. 

## 2021-04-20 ENCOUNTER — Telehealth: Payer: Self-pay | Admitting: *Deleted

## 2021-04-20 NOTE — Telephone Encounter (Signed)
  Follow up Call-  Call back number 04/16/2021  Post procedure Call Back phone  # [phone]  Permission to leave phone message Yes  Some recent data might be hidden     Patient questions:  Do you have a fever, pain , or abdominal swelling? No. Pain Score  0 *  Have you tolerated food without any problems? Yes.    Have you been able to return to your normal activities? Yes.    Do you have any questions about your discharge instructions: Diet   No. Medications  No. Follow up visit  No.  Do you have questions or concerns about your Care? No.  Actions: * If pain score is 4 or above: No action needed, pain <4.  Have you developed a fever since your procedure? no  2.   Have you had an respiratory symptoms (SOB or cough) since your procedure? no  3.   Have you tested positive for COVID 19 since your procedure no  4.   Have you had any family members/close contacts diagnosed with the COVID 19 since your procedure?  no   If yes to any of these questions please route to Joylene John, RN and Joella Prince, RN

## 2021-04-21 ENCOUNTER — Encounter: Payer: Self-pay | Admitting: Family Medicine

## 2021-04-28 ENCOUNTER — Encounter: Payer: Self-pay | Admitting: Gastroenterology

## 2021-06-09 ENCOUNTER — Encounter: Payer: Self-pay | Admitting: Family Medicine

## 2021-08-15 ENCOUNTER — Encounter: Payer: Self-pay | Admitting: Family Medicine

## 2021-09-09 ENCOUNTER — Telehealth: Payer: Self-pay | Admitting: Family Medicine

## 2021-09-09 NOTE — Telephone Encounter (Signed)
LVM 09/09/21 to change time for AWV on 09/16/21 khc

## 2021-09-15 ENCOUNTER — Other Ambulatory Visit: Payer: Self-pay | Admitting: Family Medicine

## 2021-09-15 DIAGNOSIS — Z1231 Encounter for screening mammogram for malignant neoplasm of breast: Secondary | ICD-10-CM

## 2021-09-16 ENCOUNTER — Ambulatory Visit: Payer: PPO

## 2021-10-07 ENCOUNTER — Ambulatory Visit (INDEPENDENT_AMBULATORY_CARE_PROVIDER_SITE_OTHER): Payer: PPO

## 2021-10-07 ENCOUNTER — Other Ambulatory Visit: Payer: Self-pay

## 2021-10-07 DIAGNOSIS — Z0000 Encounter for general adult medical examination without abnormal findings: Secondary | ICD-10-CM | POA: Diagnosis not present

## 2021-10-07 NOTE — Patient Instructions (Signed)
Ms. Sandefur , Thank you for taking time to come for your Medicare Wellness Visit. I appreciate your ongoing commitment to your health goals. Please review the following plan we discussed and let me know if I can assist you in the future.   Screening recommendations/referrals: Colonoscopy: Done 04/16/21 repeat every 3 years  Mammogram: Done 01/26/21 repeat every year Bone Density: Done 01/26/21 repeat every 2 years  Recommended yearly ophthalmology/optometry visit for glaucoma screening and checkup Recommended yearly dental visit for hygiene and checkup  Vaccinations: Influenza vaccine: Done 08/19/21 repeat every year Pneumococcal vaccine: Up to date Tdap vaccine: Done 12/15/17 repeat every 10 years Shingles vaccine: Completed   3/17 & 05/28/21 Covid-19:Completed 2/8, 3/3, 08/22/20 & 08/12/21  Advanced directives: Advance directive discussed with you today. I have provided a copy for you to complete at home and have notarized. Once this is complete please bring a copy in to our office so we can scan it into your chart.  Conditions/risks identified: Stay active   Next appointment: Follow up in one year for your annual wellness visit    Preventive Care 65 Years and Older, Female Preventive care refers to lifestyle choices and visits with your health care provider that can promote health and wellness. What does preventive care include? A yearly physical exam. This is also called an annual well check. Dental exams once or twice a year. Routine eye exams. Ask your health care provider how often you should have your eyes checked. Personal lifestyle choices, including: Daily care of your teeth and gums. Regular physical activity. Eating a healthy diet. Avoiding tobacco and drug use. Limiting alcohol use. Practicing safe sex. Taking low-dose aspirin every day. Taking vitamin and mineral supplements as recommended by your health care provider. What happens during an annual well check? The  services and screenings done by your health care provider during your annual well check will depend on your age, overall health, lifestyle risk factors, and family history of disease. Counseling  Your health care provider may ask you questions about your: Alcohol use. Tobacco use. Drug use. Emotional well-being. Home and relationship well-being. Sexual activity. Eating habits. History of falls. Memory and ability to understand (cognition). Work and work Statistician. Reproductive health. Screening  You may have the following tests or measurements: Height, weight, and BMI. Blood pressure. Lipid and cholesterol levels. These may be checked every 5 years, or more frequently if you are over 74 years old. Skin check. Lung cancer screening. You may have this screening every year starting at age 26 if you have a 30-pack-year history of smoking and currently smoke or have quit within the past 15 years. Fecal occult blood test (FOBT) of the stool. You may have this test every year starting at age 84. Flexible sigmoidoscopy or colonoscopy. You may have a sigmoidoscopy every 5 years or a colonoscopy every 10 years starting at age 49. Hepatitis C blood test. Hepatitis B blood test. Sexually transmitted disease (STD) testing. Diabetes screening. This is done by checking your blood sugar (glucose) after you have not eaten for a while (fasting). You may have this done every 1-3 years. Bone density scan. This is done to screen for osteoporosis. You may have this done starting at age 75. Mammogram. This may be done every 1-2 years. Talk to your health care provider about how often you should have regular mammograms. Talk with your health care provider about your test results, treatment options, and if necessary, the need for more tests. Vaccines  Your health  care provider may recommend certain vaccines, such as: Influenza vaccine. This is recommended every year. Tetanus, diphtheria, and acellular  pertussis (Tdap, Td) vaccine. You may need a Td booster every 10 years. Zoster vaccine. You may need this after age 34. Pneumococcal 13-valent conjugate (PCV13) vaccine. One dose is recommended after age 24. Pneumococcal polysaccharide (PPSV23) vaccine. One dose is recommended after age 22. Talk to your health care provider about which screenings and vaccines you need and how often you need them. This information is not intended to replace advice given to you by your health care provider. Make sure you discuss any questions you have with your health care provider. Document Released: 11/21/2015 Document Revised: 07/14/2016 Document Reviewed: 08/26/2015 Elsevier Interactive Patient Education  2017 [employer] Prevention in the Home Falls can cause injuries. They can happen to people of all ages. There are many things you can do to make your home safe and to help prevent falls. What can I do on the outside of my home? Regularly fix the edges of walkways and driveways and fix any cracks. Remove anything that might make you trip as you walk through a door, such as a raised step or threshold. Trim any bushes or trees on the path to your home. Use bright outdoor lighting. Clear any walking paths of anything that might make someone trip, such as rocks or tools. Regularly check to see if handrails are loose or broken. Make sure that both sides of any steps have handrails. Any raised decks and porches should have guardrails on the edges. Have any leaves, snow, or ice cleared regularly. Use sand or salt on walking paths during winter. Clean up any spills in your garage right away. This includes oil or grease spills. What can I do in the bathroom? Use night lights. Install grab bars by the toilet and in the tub and shower. Do not use towel bars as grab bars. Use non-skid mats or decals in the tub or shower. If you need to sit down in the shower, use a plastic, non-slip stool. Keep the floor  dry. Clean up any water that spills on the floor as soon as it happens. Remove soap buildup in the tub or shower regularly. Attach bath mats securely with double-sided non-slip rug tape. Do not have throw rugs and other things on the floor that can make you trip. What can I do in the bedroom? Use night lights. Make sure that you have a light by your bed that is easy to reach. Do not use any sheets or blankets that are too big for your bed. They should not hang down onto the floor. Have a firm chair that has side arms. You can use this for support while you get dressed. Do not have throw rugs and other things on the floor that can make you trip. What can I do in the kitchen? Clean up any spills right away. Avoid walking on wet floors. Keep items that you use a lot in easy-to-reach places. If you need to reach something above you, use a strong step stool that has a grab bar. Keep electrical cords out of the way. Do not use floor polish or wax that makes floors slippery. If you must use wax, use non-skid floor wax. Do not have throw rugs and other things on the floor that can make you trip. What can I do with my stairs? Do not leave any items on the stairs. Make sure that there are handrails on  both sides of the stairs and use them. Fix handrails that are broken or loose. Make sure that handrails are as long as the stairways. Check any carpeting to make sure that it is firmly attached to the stairs. Fix any carpet that is loose or worn. Avoid having throw rugs at the top or bottom of the stairs. If you do have throw rugs, attach them to the floor with carpet tape. Make sure that you have a light switch at the top of the stairs and the bottom of the stairs. If you do not have them, ask someone to add them for you. What else can I do to help prevent falls? Wear shoes that: Do not have high heels. Have rubber bottoms. Are comfortable and fit you well. Are closed at the toe. Do not wear  sandals. If you use a stepladder: Make sure that it is fully opened. Do not climb a closed stepladder. Make sure that both sides of the stepladder are locked into place. Ask someone to hold it for you, if possible. Clearly mark and make sure that you can see: Any grab bars or handrails. First and last steps. Where the edge of each step is. Use tools that help you move around (mobility aids) if they are needed. These include: Canes. Walkers. Scooters. Crutches. Turn on the lights when you go into a dark area. Replace any light bulbs as soon as they burn out. Set up your furniture so you have a clear path. Avoid moving your furniture around. If any of your floors are uneven, fix them. If there are any pets around you, be aware of where they are. Review your medicines with your doctor. Some medicines can make you feel dizzy. This can increase your chance of falling. Ask your doctor what other things that you can do to help prevent falls. This information is not intended to replace advice given to you by your health care provider. Make sure you discuss any questions you have with your health care provider. Document Released: 08/21/2009 Document Revised: 04/01/2016 Document Reviewed: 11/29/2014 Elsevier Interactive Patient Education  2017 [employer].

## 2021-10-07 NOTE — Progress Notes (Signed)
Virtual Visit via Telephone Note  I connected with  Jenny Hicks on 10/07/21 at 11:45 AM EST by telephone and verified that I am speaking with the correct person using two identifiers.  Medicare Annual Wellness visit completed telephonically due to Covid-19 pandemic.   Persons participating in this call: This Health Coach and this patient.   Location: Patient: Home Provider: Office   I discussed the limitations, risks, security and privacy concerns of performing an evaluation and management service by telephone and the availability of in person appointments. The patient expressed understanding and agreed to proceed.  Unable to perform video visit due to video visit attempted and failed and/or patient does not have video capability.   Some vital signs may be absent or patient reported.   Willette Brace, LPN   Subjective:   Jenny Hicks is a 70 y.o. female who presents for Medicare Annual (Subsequent) preventive examination.  Review of Systems     Cardiac Risk Factors include: advanced age (>46men, >34 women);dyslipidemia     Objective:    There were no vitals filed for this visit. There is no height or weight on file to calculate BMI.  Advanced Directives 10/07/2021 08/27/2020 11/28/2018  Does Patient Have a Medical Advance Directive? Yes No No  Does patient want to make changes to medical advance directive? Yes (MAU/Ambulatory/Procedural Areas - Information given) - -  Would patient like information on creating a medical advance directive? - No - Patient declined Yes (MAU/Ambulatory/Procedural Areas - Information given)    Current Medications (verified) Outpatient Encounter Medications as of 10/07/2021  Medication Sig   Calcium Carb-Cholecalciferol (CALCIUM 600 + D PO) Take 600 mg by mouth daily. Vit D 800 units   metoprolol tartrate (LOPRESSOR) 25 MG tablet Take 25 mg by mouth daily. 1/2 tablet daily (12.5mg  daily)   Multiple Vitamins-Minerals (CENTRUM PO) Take by  mouth daily.   Vitamin D, Cholecalciferol, 25 MCG (1000 UT) TABS Take by mouth. 2,000 units 3x a week   cyclobenzaprine (FLEXERIL) 5 MG tablet Take 1 tablet (5 mg total) by mouth 2 (two) times daily as needed for muscle spasms. (Patient not taking: Reported on 04/16/2021)   FLUZONE HIGH-DOSE QUADRIVALENT 0.7 ML SUSY    PFIZER COVID-19 VAC BIVALENT injection    SHINGRIX injection    [DISCONTINUED] pramoxine-hydrocortisone (PROCTOCREAM-HC) 1-1 % rectal cream Place 1 application rectally 2 (two) times daily. Analpram 2.5% Apply externally Bid for 7 days.   No facility-administered encounter medications on file as of 10/07/2021.    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 03/2013 Echo: EF 55-60%, mild MVP, mild to mod MR.   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  Recommended continued compression for symptomatic improvement.  She can continue Advil as needed for pain.  Recommend no lifting more than  5 pounds for another 3 weeks and then gradual increase to tolerance.  She can move the wrist and the elbow to tolerance   Scoliosis    SVT (supraventricular tachycardia) ([place])    a. 03/2013 occurred during ex MV-->resolved with adenosine and carotid massage.   Past Surgical History:  Procedure Laterality Date   COLONOSCOPY  01/12/2011   DIAGNOSTIC MAMMOGRAM  08/06/2014    digital   TRANSTHORACIC ECHOCARDIOGRAM  03/2013   EF 55-60%, mild conc hypertrophy; mild MVP, mild-mod MR   UMBILICAL HERNIA REPAIR  03/02/2021   with colprpex   VAGINAL HYSTERECTOMY  1989   partial; pt has both ovaries   Family History  Problem Relation Age of Onset   Heart attack Mother    Hypertension Mother        Alzheimer's   Stroke Mother        Afib   Breast cancer Mother    Osteoporosis Mother        cervical/femur fracture   Kidney disease Mother    Myasthenia gravis Father    Prostate cancer Father    Prostate cancer Brother    Arthritis Maternal Aunt    Arthritis Maternal Uncle    Prostate cancer Maternal Uncle    Early death Maternal Grandmother        died after 2023-01-28 baby   Heart disease Maternal Grandfather    Congestive Heart Failure Maternal Grandfather    Ovarian cancer Paternal Grandmother    Other Daughter        mitral valve damaged had to be repaired, cause unknown   Breast cancer Cousin    Colon cancer Neg Hx    Esophageal cancer Neg Hx    Rectal cancer Neg Hx    Colon polyps Neg Hx    Stomach cancer Neg Hx    Social History   Socioeconomic History   Marital status: Married    Spouse name: Richardson Landry   Number of children: 5   Years of education: 67   Highest education level: Not on file  Occupational History   Occupation: retired Therapist, sports  Tobacco Use   Smoking status: Never   Smokeless tobacco: Never  Vaping Use   Vaping Use: Never used  Substance and Sexual Activity   Alcohol use: No   Drug use: No   Sexual activity: Yes    Partners: Male    Comment: married  Other Topics Concern   Not on file  Social History Narrative   Married to [place], they have 5 children.    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   Drinks caffeine, takes a daily vitamin   Wears seatbelt, smoke detector in the home, no firearms in the home.    Feels safe in her relationships.    Social Determinants of Health   Financial Resource  Strain: Low Risk    Difficulty of Paying Living Expenses: Not hard at all  Food Insecurity: No Food Insecurity   Worried About Charity fundraiser in the Last Year: Never true   [place] in the Last Year: Never true  Transportation Needs: No Transportation Needs   Lack of Transportation (Medical): No   Lack of Transportation (Non-Medical): No  Physical Activity: Insufficiently Active   Days of Exercise per Week: 3 days   Minutes of Exercise per Session: 30 min  Stress: No Stress Concern Present   Feeling of Stress : Not at  all  Social Connections: Moderately Isolated   Frequency of Communication with Friends and Family: More than three times a week   Frequency of Social Gatherings with Friends and Family: More than three times a week   Attends Religious Services: Never   Marine scientist or Organizations: No   Attends Music therapist: Never   Marital Status: Married    Tobacco Counseling Counseling given: Not Answered   Clinical Intake:  Pre-visit preparation completed: Yes  Pain : No/denies pain     BMI - recorded: 28.17 Nutritional Status: BMI 25 -29 Overweight Nutritional Risks: None Diabetes: No  How often do you need to have someone help you when you read instructions, pamphlets, or other written materials from your doctor or pharmacy?: 1 - Never  Diabetic?No  Interpreter Needed?: No  Information entered by :: Charlott Rakes, LPN   Activities of Daily Living In your present state of health, do you have any difficulty performing the following activities: 10/07/2021  Hearing? N  Vision? N  Difficulty concentrating or making decisions? N  Walking or climbing stairs? N  Dressing or bathing? N  Doing errands, shopping? N  Preparing Food and eating ? N  Using the Toilet? N  In the past six months, have you accidently leaked urine? N  Do you have problems with loss of bowel control? N  Managing your Medications? N  Managing your  Finances? N  Housekeeping or managing your Housekeeping? N  Some recent data might be hidden    Patient Care Team: Ma Hillock, DO as PCP - General (Family Medicine) Debara Pickett Nadean Corwin, MD as PCP - Cardiology (Cardiology) Debara Pickett Nadean Corwin, MD as Consulting Physician (Cardiology) Vania Rea, MD as Consulting Physician (Obstetrics and Gynecology) Newt Minion, MD as Consulting Physician (Orthopedic Surgery) Marti Sleigh, MD as Referring Physician (Gynecology)  Indicate any recent Medical Services you may have received from other than [facility] providers in the past year (date may be approximate).     Assessment:   This is a routine wellness examination for Naydelin.  Hearing/Vision screen Hearing Screening - Comments:: Pt denies nay hearing issues  Vision Screening - Comments:: Pt follows up with Dr Macarthur Critchley for annual eye exams   Dietary issues and exercise activities discussed: Current Exercise Habits: Home exercise routine, Type of exercise: walking, Time (Minutes): 30, Frequency (Times/Week): 3, Weekly Exercise (Minutes/Week): 90   Goals Addressed             This Visit's Progress    Patient Stated       Stay active        Depression Screen PHQ 2/9 Scores 10/07/2021 01/06/2021 08/27/2020 01/01/2020 12/21/2018 11/28/2018 12/15/2017  PHQ - 2 Score 0 0 0 0 0 0 0    Fall Risk Fall Risk  10/07/2021 01/06/2021 08/27/2020 01/01/2020 11/28/2018  Falls in the past year? 0 0 0 1 1  Number falls in past yr: 0 0 0 0 0  Injury with Fall? 0 0 0 1 1  Risk for fall due to : Impaired vision History of fall(s) - - Other (Comment)  Risk for fall due to: Comment - - - - walked backward  Follow up Falls prevention discussed - Falls prevention discussed Falls evaluation completed;Education provided;Falls prevention discussed Falls prevention discussed    FALL RISK PREVENTION PERTAINING TO THE HOME:  Any stairs in or around the home? No  If so, are there any without handrails? No   Home free  of loose throw rugs in walkways, pet beds, electrical cords, etc? Yes  Adequate lighting in your home to reduce risk of falls? Yes   ASSISTIVE DEVICES UTILIZED TO PREVENT FALLS:  Life alert? No  Use of a cane, walker or w/c? No  Grab bars in the bathroom? Yes  Shower chair or bench in shower? Yes  Elevated toilet seat or a handicapped toilet? No   TIMED UP AND GO:  Was the test performed? No .   Cognitive Function: MMSE - Mini Mental State Exam 11/28/2018  Orientation to time 5  Orientation to Place 5  Registration 3  Attention/ Calculation 5  Recall 2  Language- name 2 objects 2  Language- repeat 1  Language- follow 3 step command 3  Language- read & follow direction 1  Write a sentence 1  Copy design 1  Total score 29     6CIT Screen 10/07/2021  What Year? 0 points  What month? 0 points  What time? 0 points  Count back from 20 0 points  Months in reverse 0 points  Repeat phrase 0 points  Total Score 0    Immunizations Immunization History  Administered Date(s) Administered   Influenza Split 08/26/2011, 08/28/2012   Influenza, High Dose Seasonal PF 07/18/2018, 09/03/2019   Influenza-Unspecified 08/24/2014, 09/01/2015, 07/25/2016, 08/22/2017, 09/03/2019, 09/09/2020, 08/19/2021   PFIZER(Purple Top)SARS-COV-2 Vaccination 12/17/2019, 01/09/2020, 08/22/2020, 08/12/2021   Pneumococcal Conjugate-13 10/08/2016   Pneumococcal Polysaccharide-23 03/15/2013, 12/15/2017   Td 12/15/2017   Tdap 11/09/2007   Zoster Recombinat (Shingrix) 01/22/2021, 05/28/2021   Zoster, Live 11/06/2011    TDAP status: Up to date  Flu Vaccine status: Up to date  Pneumococcal vaccine status: Up to date  Covid-19 vaccine status: Completed vaccines  Qualifies for Shingles Vaccine? Yes   Zostavax completed Yes   Shingrix Completed?: Yes  Screening Tests Health Maintenance  Topic Date Due   COVID-19 Vaccine (5 - Booster for Pfizer series) 10/07/2021   MAMMOGRAM  01/27/2023    DEXA SCAN  01/27/2023   COLONOSCOPY (Pts 45-14yrs Insurance coverage will need to be confirmed)  04/16/2024   TETANUS/TDAP  12/16/2027   Pneumonia Vaccine 70+ Years old  Completed   INFLUENZA VACCINE  Completed   Hepatitis C Screening  Completed   Zoster Vaccines- Shingrix  Completed   HPV VACCINES  Aged Out    Health Maintenance  Health Maintenance Due  Topic Date Due   COVID-19 Vaccine (5 - Booster for [place] series) 10/07/2021    Colorectal cancer screening: Type of screening: Colonoscopy. Completed 04/16/21. Repeat every 3 years  Mammogram status: Completed 01/26/21. Repeat every year  Bone Density status: Completed 01/26/21. Results reflect: Bone density results: OSTEOPENIA. Repeat every 2 years.   Additional Screening:  Hepatitis C Screening:  Completed 10/08/16  Vision Screening: Recommended annual ophthalmology exams for early detection of glaucoma and other disorders of the eye. Is the patient up to date with their annual eye exam?  Yes  Who is the provider or what is the name of the office in which the patient attends annual eye exams? Dr Macarthur Critchley If pt is not established with a provider, would they like to be referred to a provider to establish care? No .   Dental Screening: Recommended annual dental exams for proper oral hygiene  Community Resource Referral / Chronic Care Management: CRR required this visit?  No   CCM required this visit?  No      Plan:     I have personally reviewed  and noted the following in the patient's chart:   Medical and social history Use of alcohol, tobacco or illicit drugs  Current medications and supplements including opioid prescriptions.  Functional ability and status Nutritional status Physical activity Advanced directives List of other physicians Hospitalizations, surgeries, and ER visits in previous 12 months Vitals Screenings to include cognitive, depression, and falls Referrals and appointments  In addition, I  have reviewed and discussed with patient certain preventive protocols, quality metrics, and best practice recommendations. A written personalized care plan for preventive services as well as general preventive health recommendations were provided to patient.     Willette Brace, LPN   [id number]   Nurse Notes: None

## 2021-10-15 ENCOUNTER — Telehealth: Payer: Self-pay | Admitting: Family Medicine

## 2021-10-15 ENCOUNTER — Ambulatory Visit (HOSPITAL_BASED_OUTPATIENT_CLINIC_OR_DEPARTMENT_OTHER)
Admission: RE | Admit: 2021-10-15 | Discharge: 2021-10-15 | Disposition: A | Discharge status: Home / Self Care | Payer: PPO | Source: Ambulatory Visit | Attending: Family Medicine | Admitting: Family Medicine

## 2021-10-15 ENCOUNTER — Encounter: Payer: Self-pay | Admitting: Family Medicine

## 2021-10-15 ENCOUNTER — Ambulatory Visit (INDEPENDENT_AMBULATORY_CARE_PROVIDER_SITE_OTHER): Payer: PPO | Admitting: Family Medicine

## 2021-10-15 ENCOUNTER — Other Ambulatory Visit: Payer: Self-pay

## 2021-10-15 ENCOUNTER — Telehealth: Payer: Self-pay

## 2021-10-15 VITALS — BP 134/72 | HR 74 | Temp 98.2°F | Ht 63.0 in | Wt 157.0 lb

## 2021-10-15 DIAGNOSIS — N200 Calculus of kidney: Secondary | ICD-10-CM | POA: Insufficient documentation

## 2021-10-15 DIAGNOSIS — R350 Frequency of micturition: Secondary | ICD-10-CM

## 2021-10-15 DIAGNOSIS — R002 Palpitations: Secondary | ICD-10-CM

## 2021-10-15 DIAGNOSIS — R1110 Vomiting, unspecified: Secondary | ICD-10-CM | POA: Diagnosis not present

## 2021-10-15 DIAGNOSIS — R1030 Lower abdominal pain, unspecified: Secondary | ICD-10-CM | POA: Diagnosis not present

## 2021-10-15 DIAGNOSIS — R109 Unspecified abdominal pain: Secondary | ICD-10-CM | POA: Diagnosis not present

## 2021-10-15 DIAGNOSIS — M545 Low back pain, unspecified: Secondary | ICD-10-CM | POA: Insufficient documentation

## 2021-10-15 HISTORY — DX: Calculus of kidney: N20.0

## 2021-10-15 LAB — CBC WITH DIFFERENTIAL/PLATELET
Basophils Absolute: 0 10*3/uL (ref 0.0–0.1)
Basophils Relative: 0.4 % (ref 0.0–3.0)
Eosinophils Absolute: 0.1 10*3/uL (ref 0.0–0.7)
Eosinophils Relative: 1 % (ref 0.0–5.0)
HCT: 40.1 % (ref 36.0–46.0)
Hemoglobin: 13.4 g/dL (ref 12.0–15.0)
Lymphocytes Relative: 30.8 % (ref 12.0–46.0)
Lymphs Abs: 1.6 10*3/uL (ref 0.7–4.0)
MCHC: 33.5 g/dL (ref 30.0–36.0)
MCV: 91.7 fl (ref 78.0–100.0)
Monocytes Absolute: 0.4 10*3/uL (ref 0.1–1.0)
Monocytes Relative: 7 % (ref 3.0–12.0)
Neutro Abs: 3.1 10*3/uL (ref 1.4–7.7)
Neutrophils Relative %: 60.8 % (ref 43.0–77.0)
Platelets: 265 10*3/uL (ref 150.0–400.0)
RBC: 4.37 Mil/uL (ref 3.87–5.11)
RDW: 12.8 % (ref 11.5–15.5)
WBC: 5.1 10*3/uL (ref 4.0–10.5)

## 2021-10-15 LAB — POCT URINALYSIS DIPSTICK
Bilirubin, UA: NEGATIVE
Blood, UA: 10
Glucose, UA: NEGATIVE
Ketones, UA: NEGATIVE
Leukocytes, UA: NEGATIVE
Nitrite, UA: NEGATIVE
Protein, UA: NEGATIVE
Spec Grav, UA: 1.02 (ref 1.010–1.025)
Urobilinogen, UA: 0.2 E.U./dL
pH, UA: 5.5 (ref 5.0–8.0)

## 2021-10-15 LAB — COMPREHENSIVE METABOLIC PANEL
ALT: 15 U/L (ref 0–35)
AST: 16 U/L (ref 0–37)
Albumin: 4.1 g/dL (ref 3.5–5.2)
Alkaline Phosphatase: 49 U/L (ref 39–117)
BUN: 20 mg/dL (ref 6–23)
CO2: 29 mEq/L (ref 19–32)
Calcium: 9 mg/dL (ref 8.4–10.5)
Chloride: 102 mEq/L (ref 96–112)
Creatinine, Ser: 0.75 mg/dL (ref 0.40–1.20)
GFR: 80.67 mL/min (ref >60.00)
Glucose, Bld: 91 mg/dL (ref 70–99)
Potassium: 3.5 mEq/L (ref 3.5–5.1)
Sodium: 139 mEq/L (ref 135–145)
Total Bilirubin: 0.8 mg/dL (ref 0.2–1.2)
Total Protein: 6.6 g/dL (ref 6.0–8.3)

## 2021-10-15 LAB — C-REACTIVE PROTEIN: CRP: <1 mg/dL (ref 0.5–20.0)

## 2021-10-15 LAB — LIPASE: Lipase: 21 U/L (ref 11.0–59.0)

## 2021-10-15 MED ORDER — NAPROXEN 500 MG PO TABS: 500.0000 mg | ORAL_TABLET | Freq: Two times a day (BID) | ORAL | 0 refills | Status: DC

## 2021-10-15 MED ORDER — CEFTRIAXONE SODIUM 1 G IJ SOLR
1.0000 g | Freq: Once | INTRAMUSCULAR | Status: AC
Start: 2021-10-15 — End: 2021-10-15
  Administered 2021-10-15: 1 g via INTRAMUSCULAR

## 2021-10-15 MED ORDER — ONDANSETRON HCL 4 MG PO TABS: 4.0000 mg | ORAL_TABLET | Freq: Three times a day (TID) | ORAL | 0 refills | Status: DC | PRN

## 2021-10-15 MED ORDER — AMOXICILLIN-POT CLAVULANATE 875-125 MG PO TABS: 1.0000 | ORAL_TABLET | Freq: Two times a day (BID) | ORAL | 0 refills | Status: DC

## 2021-10-15 MED ORDER — TAMSULOSIN HCL 0.4 MG PO CAPS: 0.4000 mg | ORAL_CAPSULE | Freq: Every day | ORAL | 0 refills | Status: DC

## 2021-10-15 MED ORDER — IOHEXOL 300 MG/ML  SOLN
80.0000 mL | Freq: Once | INTRAMUSCULAR | Status: AC | PRN
  Administered 2021-10-15: 80 mL via INTRAVENOUS

## 2021-10-15 NOTE — Telephone Encounter (Signed)
I attempted to call patient with her results of stat labs and imaging.  I had to leave a voice message for her. If patient returns call: Please provide her with the following information:   -Her white blood cell count does not show signs of systemic infection. -Her liver and kidney functions are normal, and her electrolytes are normal. -Her pancreatic enzyme and inflammatory marker is normal.  Her CT does show evidence of a kidney stone at the lower portion of her ureter, just before dropping into the bladder.  This kidney stone is 2 mm.  This is a kidney stone will eventually pass without intervention.  This is what is causing her symptoms.   -Incidentally she also has a very small left kidney stone that still up in the kidney and nonobstructing.  This should not be causing her symptoms.   -Her right renal cyst is also stable from prior imaging.   During her appointment we prescribed Augmentin and Zofran.   -I have added naproxen twice daily with food.  This not only helps with pain, but it also helps take the inflammation out of the ureter so that the kidney stone can pass more easily.  I would recommend she take this twice a day with food for at least a week or until kidney stone passes.  - I also added Flomax for her to take once a day until kidney stone passes.  This is a medication commonly used for passage of kidney stones. -Plenty of fluids.  Lemonade can help passage of stone as well.  No follow-up needed, unless her pain is worsening, she continues to have fevers or chills, cannot tolerate p.o. > emergency room  or appointment with [place] >if she does not feel she passed her kidney stone after 2 weeks

## 2021-10-15 NOTE — Telephone Encounter (Signed)
Attempted to call patient, received voicemail.  Phone note already generated in the event she calls back.

## 2021-10-15 NOTE — Telephone Encounter (Signed)
Spoke with radiology [id number] for STAT CT report stated the following.   2 mm nonobstructing right UVJ stone.  No hydronephrosis.   Punctate left lower pole nephrolithiasis.

## 2021-10-15 NOTE — Progress Notes (Signed)
This visit occurred during the SARS-CoV-2 public health emergency.  Safety protocols were in place, including screening questions prior to the visit, additional usage of staff PPE, and extensive cleaning of exam room while observing appropriate contact time as indicated for disinfecting solutions.    Jenny Hicks , September 02, 1951, 70 y.o., female MRN: [id number] Patient Care Team    Relationship Specialty Notifications Start End  Ma Hillock, DO PCP - General Family Medicine  09/16/16   Pixie Casino, MD PCP - Cardiology Cardiology  09/30/20   Pixie Casino, MD Consulting Physician Cardiology  09/16/16   Vania Rea, MD Consulting Physician Obstetrics and Gynecology  09/16/16   Newt Minion, MD Consulting Physician Orthopedic Surgery  11/28/18   Marti Sleigh, MD Referring Physician Gynecology  01/06/21     Chief Complaint  Patient presents with   Urinary Frequency    Pt c/o urine frequency, palpitations, elevated home BP readings nausea, chills, loss of appetite urine urgency, back pain and fatigue ; pt recent had surgery on 03/03/21 for colpopexy     Subjective: Pt presents for an OV with complaints of urinary frequency, palpitations, nausea, chills, urinary urgency, right flank pain and fatigue.  Patient reports she woke Saturday morning not feeling herself, with nausea and chills.  She endorsed having a sharp fleeting pain on her right flank that only lasted a couple minutes Tuesday.  She endorses a similar pain 1 month ago.  She has no prior history of kidney stones.  She underwent sacral colpopexy in April.  She had a bout of colitis November 2021.  He has a known right renal cyst approximately 4.6 cm.  She reports she is tolerating p.o today but has had to take it very easy on her stomach.  She has not drank or eaten much since Saturday at onset of symptoms.  She denies melena, hematochezia or changes in bowel habits.  Depression screen [facility] 2/9 10/07/2021 01/06/2021  08/27/2020 01/01/2020 12/21/2018  Decreased Interest 0 0 0 0 0  Down, Depressed, Hopeless 0 0 0 0 0  PHQ - 2 Score 0 0 0 0 0    Allergies  Allergen Reactions   Hydrocodone-Acetaminophen Other (See Comments)    Made her BP go way up   Pantoprazole Sodium Palpitations   Social History   Social History Narrative   Married to [place], they have 5 children.    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   Drinks caffeine, takes a daily vitamin   Wears seatbelt, smoke detector in the home, no firearms in the home.    Feels safe in her relationships.    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 03/2013 Echo: EF 55-60%, mild MVP, mild to mod MR.   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  Recommended continued compression for symptomatic improvement.  She can continue Advil as needed for pain.  Recommend no lifting more than 5 pounds for another 3 weeks and then gradual increase to tolerance.  She can move the wrist and the elbow to tolerance   Scoliosis    SVT (supraventricular tachycardia) ([place])    a. 03/2013 occurred during ex MV-->resolved with adenosine and carotid massage.   Past Surgical History:  Procedure Laterality Date   COLONOSCOPY  01/12/2011   DIAGNOSTIC MAMMOGRAM  08/06/2014   digital   TRANSTHORACIC ECHOCARDIOGRAM  03/2013   EF 55-60%, mild conc hypertrophy; mild MVP, mild-mod MR   UMBILICAL HERNIA REPAIR   03/02/2021   with colprpex   VAGINAL HYSTERECTOMY  1989   partial; pt has both ovaries   Family History  Problem Relation Age of Onset   Heart attack Mother    Hypertension Mother        Alzheimer's   Stroke Mother        Afib   Breast cancer Mother    Osteoporosis Mother        cervical/femur fracture   Kidney disease Mother    Myasthenia gravis Father    Prostate cancer Father    Prostate cancer Brother    Arthritis Maternal Aunt    Arthritis Maternal Uncle    Prostate cancer Maternal Uncle    Early death Maternal Grandmother        died after January 16, 2023 baby   Heart disease Maternal Grandfather    Congestive Heart Failure Maternal Grandfather    Ovarian cancer Paternal Grandmother    Other Daughter        mitral valve damaged had to be repaired, cause unknown   Breast cancer Cousin    Colon cancer Neg Hx    Esophageal cancer Neg Hx    Rectal cancer Neg Hx    Colon polyps Neg Hx    Stomach cancer Neg Hx    Allergies as of 10/15/2021       Reactions   Hydrocodone-acetaminophen Other (See Comments)   Made her BP go way up   Pantoprazole Sodium Palpitations        Medication List        Accurate as of October 15, 2021  8:25 AM. If you have any questions, ask your nurse or doctor.          CALCIUM 600 + D PO Take 600 mg by mouth daily. Vit D 800 units   CENTRUM PO Take by mouth daily.   cyclobenzaprine 5 MG tablet Commonly known as: FLEXERIL Take 1 tablet (5 mg total) by mouth 2 (two) times daily as needed for muscle spasms.   Fluzone High-Dose Quadrivalent 0.7 ML Susy Generic drug: Influenza Vac High-Dose Quad   metoprolol tartrate 25 MG tablet Commonly known as: LOPRESSOR Take 25 mg by mouth daily. 1/2 tablet daily (12.26m daily)   Pfizer COVID-19 Vac Bivalent injection Generic drug: COVID-19 mRNA bivalent vaccine (Therapist, music   Shingrix injection Generic drug: Zoster Vaccine Adjuvanted   Vitamin D (Cholecalciferol) 25 MCG (1000 UT) Tabs Take by  mouth. 2,000 units 3x a week        All past medical history, surgical history, allergies, family history, immunizations andmedications were updated in the EMR today and reviewed under the history and medication portions of their EMR.     ROS Negative, with the exception of above mentioned in HPI   Objective:  BP 134/72  Pulse 74   Temp 98.2 F (36.8 C) (Oral)   Ht _0  (1.6 m)   Wt 157 lb (71.2 kg)   SpO2 100%   BMI 27.81 kg/m  Body mass index is 27.81 kg/m.  Physical Exam Constitutional:      General: She is not in acute distress.    Appearance: Normal appearance. She is not ill-appearing, toxic-appearing or diaphoretic.  HENT:     Head: Normocephalic and atraumatic.  Eyes:     General: No scleral icterus.       Right eye: No discharge.        Left eye: No discharge.     Extraocular Movements: Extraocular movements intact.     Conjunctiva/sclera: Conjunctivae normal.  Cardiovascular:     Rate and Rhythm: Normal rate. Rhythm irregular.     Pulses: Normal pulses.     Heart sounds: No murmur heard.   No friction rub. No gallop.     Comments: Irregularly irregular today. Pulmonary:     Effort: Pulmonary effort is normal. No respiratory distress.     Breath sounds: Normal breath sounds. No stridor. No wheezing, rhonchi or rales.  Chest:     Chest wall: No tenderness.  Abdominal:     General: Abdomen is flat. Bowel sounds are normal. There is no distension.     Palpations: Abdomen is soft. There is no mass.     Tenderness: There is abdominal tenderness. There is no right CVA tenderness, left CVA tenderness, guarding or rebound.  Neurological:     Mental Status: She is alert and oriented to person, place, and time. Mental status is at baseline.  Psychiatric:        Mood and Affect: Mood normal.        Behavior: Behavior normal.        Thought Content: Thought content normal.        Judgment: Judgment normal.    No results found. No results found. No results  found for this or any previous visit (from the past 24 hour(s)).  Assessment/Plan: LEARA RAWL is a 70 y.o. female present for OV for  Urine frequency/low back-right flank pain. Point-of-care urine with small amount of blood, otherwise normal.  Discussed differential with her today and suspect possible kidney stone causing discomfort.  She has not had kidney stones in the past.  She does have mild upper mid abdominal pain on exam, cannot rule out colitis as a possible cause as well. Has had chills, therefore will treat with Rocephin 1 g IM today.  Start Augmentin twice daily x10 days this evening. Zofran prescribed for nausea Stat labs and stat CT ordered - POCT Urinalysis Dipstick - Urinalysis w microscopic + reflex cultur - CT Abdomen Pelvis W Contrast; Future - CBC w/Diff - C-reactive protein - Lipase - cefTRIAXone (ROCEPHIN) injection 1 g - Comp Met (CMET)  Palpitations Rare, but present irregularity in rhythm today.  Vital signs are stable.  Not tachycardic.  Possibly from acute illness/mild dehydration. Rest.  Hydrate. - CBC w/Diff - C-reactive protein - Lipase - Comp Met (CMET) Emergent precautions discussed   Reviewed expectations re: course of current medical issues. Discussed self-management of symptoms. Outlined signs and symptoms indicating need for more acute intervention. Patient verbalized understanding and all questions were answered. Patient received an After-Visit Summary. Update: *Right ureter 2 mm kidney stone identified on CT.  Labs normal. Added Flomax and naproxen to the above regimen.  Phone note in system.  Orders Placed  This Encounter  Procedures   Urinalysis w microscopic + reflex cultur   CBC w/Diff   C-reactive protein   Comp Met (CMET)   Lipase   TSH   POCT Urinalysis Dipstick   No orders of the defined types were placed in this encounter.  Referral Orders  No referral(s) requested today    > 45 Minutes was dedicated to this  patient's encounter to include face-to-face time with patient and post-visit work- which include documentation and getting stat labs, stat imaging and discussing follow-up plan after results.  Note is dictated utilizing voice recognition software. Although note has been proof read prior to signing, occasional typographical errors still can be missed. If any questions arise, please do not hesitate to call for verification.   electronically signed by:  Howard Pouch, DO  [place]

## 2021-10-15 NOTE — Telephone Encounter (Signed)
Was able to contact patient.  Conveyed all of her results and recommended plan to her. She appreciated quick response. She reports understanding of all instructions, follow-up instructions and emergent precautions.

## 2021-10-15 NOTE — Patient Instructions (Addendum)
  Great to see you today.  I have refilled the medication(s) we provide.   If labs were collected, we will inform you of lab results once received either by echart message or telephone call.   - echart message- for normal results that have been seen by the patient already.   - telephone call: abnormal results or if patient has not viewed results in their echart.   If any worsening symptoms or unable to tolerate meds/liquids> please go to local ED ASAP.   Augmentin start tonight.  Rocephin shot today.  Zofran prescribed for nausea.   Push fluids. Clear liquid diet today.   My concern is colitis, kidney stone or even pancreatitis.

## 2021-10-16 ENCOUNTER — Ambulatory Visit (HOSPITAL_BASED_OUTPATIENT_CLINIC_OR_DEPARTMENT_OTHER): Payer: PPO

## 2021-10-16 NOTE — Telephone Encounter (Signed)
Noted  

## 2021-10-17 LAB — URINE CULTURE
MICRO NUMBER:: 12736377
SPECIMEN QUALITY:: ADEQUATE

## 2021-10-17 LAB — URINALYSIS W MICROSCOPIC + REFLEX CULTURE
Bacteria, UA: NONE SEEN /HPF
Bilirubin Urine: NEGATIVE
Glucose, UA: NEGATIVE
Hgb urine dipstick: NEGATIVE
Hyaline Cast: NONE SEEN /LPF
Ketones, ur: NEGATIVE
Nitrites, Initial: NEGATIVE
Protein, ur: NEGATIVE
Specific Gravity, Urine: 1.013 (ref 1.001–1.035)
Squamous Epithelial / HPF: NONE SEEN /HPF (ref <=5)
WBC, UA: NONE SEEN /HPF (ref 0–5)
pH: 6 (ref 5.0–8.0)

## 2021-10-17 LAB — CULTURE INDICATED

## 2021-10-30 DIAGNOSIS — N132 Hydronephrosis with renal and ureteral calculous obstruction: Secondary | ICD-10-CM | POA: Diagnosis not present

## 2021-10-30 DIAGNOSIS — K820 Obstruction of gallbladder: Secondary | ICD-10-CM | POA: Diagnosis not present

## 2021-10-30 DIAGNOSIS — N281 Cyst of kidney, acquired: Secondary | ICD-10-CM | POA: Diagnosis not present

## 2021-10-30 DIAGNOSIS — N23 Unspecified renal colic: Secondary | ICD-10-CM | POA: Diagnosis not present

## 2021-10-30 DIAGNOSIS — K219 Gastro-esophageal reflux disease without esophagitis: Secondary | ICD-10-CM | POA: Diagnosis not present

## 2021-10-30 DIAGNOSIS — K5750 Diverticulosis of both small and large intestine without perforation or abscess without bleeding: Secondary | ICD-10-CM | POA: Diagnosis not present

## 2021-10-30 DIAGNOSIS — R110 Nausea: Secondary | ICD-10-CM | POA: Diagnosis not present

## 2021-11-03 NOTE — Telephone Encounter (Signed)
Pt wanted to inform PCP that stone was passed on 2021-11-01. She wanted to thank provider for all she does.

## 2022-01-07 ENCOUNTER — Ambulatory Visit (INDEPENDENT_AMBULATORY_CARE_PROVIDER_SITE_OTHER): Payer: PPO | Admitting: Family Medicine

## 2022-01-07 ENCOUNTER — Other Ambulatory Visit: Payer: Self-pay

## 2022-01-07 ENCOUNTER — Encounter: Payer: Self-pay | Admitting: Family Medicine

## 2022-01-07 VITALS — BP 118/67 | HR 65 | Temp 97.6°F | Ht 63.0 in | Wt 163.0 lb

## 2022-01-07 DIAGNOSIS — I340 Nonrheumatic mitral (valve) insufficiency: Secondary | ICD-10-CM | POA: Diagnosis not present

## 2022-01-07 DIAGNOSIS — I471 Supraventricular tachycardia, unspecified: Secondary | ICD-10-CM

## 2022-01-07 DIAGNOSIS — Z0000 Encounter for general adult medical examination without abnormal findings: Secondary | ICD-10-CM

## 2022-01-07 DIAGNOSIS — L989 Disorder of the skin and subcutaneous tissue, unspecified: Secondary | ICD-10-CM | POA: Diagnosis not present

## 2022-01-07 DIAGNOSIS — E663 Overweight: Secondary | ICD-10-CM | POA: Diagnosis not present

## 2022-01-07 DIAGNOSIS — E559 Vitamin D deficiency, unspecified: Secondary | ICD-10-CM

## 2022-01-07 DIAGNOSIS — I456 Pre-excitation syndrome: Secondary | ICD-10-CM | POA: Diagnosis not present

## 2022-01-07 DIAGNOSIS — M8580 Other specified disorders of bone density and structure, unspecified site: Secondary | ICD-10-CM | POA: Diagnosis not present

## 2022-01-07 DIAGNOSIS — Z131 Encounter for screening for diabetes mellitus: Secondary | ICD-10-CM | POA: Diagnosis not present

## 2022-01-07 DIAGNOSIS — E785 Hyperlipidemia, unspecified: Secondary | ICD-10-CM

## 2022-01-07 DIAGNOSIS — I341 Nonrheumatic mitral (valve) prolapse: Secondary | ICD-10-CM

## 2022-01-07 LAB — COMPREHENSIVE METABOLIC PANEL
ALT: 14 U/L (ref 0–35)
AST: 16 U/L (ref 0–37)
Albumin: 4.1 g/dL (ref 3.5–5.2)
Alkaline Phosphatase: 51 U/L (ref 39–117)
BUN: 20 mg/dL (ref 6–23)
CO2: 32 mEq/L (ref 19–32)
Calcium: 9.1 mg/dL (ref 8.4–10.5)
Chloride: 104 mEq/L (ref 96–112)
Creatinine, Ser: 0.76 mg/dL (ref 0.40–1.20)
GFR: 79.27 mL/min (ref >60.00)
Glucose, Bld: 90 mg/dL (ref 70–99)
Potassium: 4.3 mEq/L (ref 3.5–5.1)
Sodium: 142 mEq/L (ref 135–145)
Total Bilirubin: 0.6 mg/dL (ref 0.2–1.2)
Total Protein: 6.6 g/dL (ref 6.0–8.3)

## 2022-01-07 LAB — LIPID PANEL
Cholesterol: 198 mg/dL (ref 0–200)
HDL: 59.6 mg/dL (ref >39.00)
LDL Cholesterol: 114 mg/dL — ABNORMAL HIGH (ref 0–99)
NonHDL: 138.84
Total CHOL/HDL Ratio: 3
Triglycerides: 124 mg/dL (ref 0.0–149.0)
VLDL: 24.8 mg/dL (ref 0.0–40.0)

## 2022-01-07 LAB — CBC
HCT: 40.8 % (ref 36.0–46.0)
Hemoglobin: 13.4 g/dL (ref 12.0–15.0)
MCHC: 32.9 g/dL (ref 30.0–36.0)
MCV: 92.7 fl (ref 78.0–100.0)
Platelets: 228 10*3/uL (ref 150.0–400.0)
RBC: 4.4 Mil/uL (ref 3.87–5.11)
RDW: 13 % (ref 11.5–15.5)
WBC: 4.6 10*3/uL (ref 4.0–10.5)

## 2022-01-07 LAB — VITAMIN D 25 HYDROXY (VIT D DEFICIENCY, FRACTURES): VITD: 35.42 ng/mL (ref 30.00–100.00)

## 2022-01-07 LAB — TSH: TSH: 1.78 u[IU]/mL (ref 0.35–5.50)

## 2022-01-07 LAB — HEMOGLOBIN A1C: Hgb A1c MFr Bld: 5.5 % (ref 4.6–6.5)

## 2022-01-07 NOTE — Patient Instructions (Signed)
? ?Great to see you today.  ?I have refilled the medication(s) we provide.  ? ?If labs were collected, we will inform you of lab results once received either by echart message or telephone call.  ? - echart message- for normal results that have been seen by the patient already.  ? - telephone call: abnormal results or if patient has not viewed results in their echart. ? ? ?Health Maintenance After Age 71 ?After age 54, you are at a higher risk for certain long-term diseases and infections as well as injuries from falls. Falls are a major cause of broken bones and head injuries in people who are older than age 73. Getting regular preventive care can help to keep you healthy and well. Preventive care includes getting regular testing and making lifestyle changes as recommended by your health care provider. Talk with your health care provider about: ?Which screenings and tests you should have. A screening is a test that checks for a disease when you have no symptoms. ?A diet and exercise plan that is right for you. ?What should I know about screenings and tests to prevent falls? ?Screening and testing are the best ways to find a health problem early. Early diagnosis and treatment give you the best chance of managing medical conditions that are common after age 37. Certain conditions and lifestyle choices may make you more likely to have a fall. Your health care provider may recommend: ?Regular vision checks. Poor vision and conditions such as cataracts can make you more likely to have a fall. If you wear glasses, make sure to get your prescription updated if your vision changes. ?Medicine review. Work with your health care provider to regularly review all of the medicines you are taking, including over-the-counter medicines. Ask your health care provider about any side effects that may make you more likely to have a fall. Tell your health care provider if any medicines that you take make you feel dizzy or  sleepy. ?Strength and balance checks. Your health care provider may recommend certain tests to check your strength and balance while standing, walking, or changing positions. ?Foot health exam. Foot pain and numbness, as well as not wearing proper footwear, can make you more likely to have a fall. ?Screenings, including: ?Osteoporosis screening. Osteoporosis is a condition that causes the bones to get weaker and break more easily. ?Blood pressure screening. Blood pressure changes and medicines to control blood pressure can make you feel dizzy. ?Depression screening. You may be more likely to have a fall if you have a fear of falling, feel depressed, or feel unable to do activities that you used to do. ?Alcohol use screening. Using too much alcohol can affect your balance and may make you more likely to have a fall. ?Follow these instructions at home: ?Lifestyle ?Do not drink alcohol if: ?Your health care provider tells you not to drink. ?If you drink alcohol: ?Limit how much you have to: ?0-1 drink a day for women. ?0-2 drinks a day for men. ?Know how much alcohol is in your drink. In the [place] one drink equals one 12 oz bottle of beer (355 mL), one 5 oz glass of wine (148 mL), or one 1? oz glass of hard liquor (44 mL). ?Do not use any products that contain nicotine or tobacco. These products include cigarettes, chewing tobacco, and vaping devices, such as e-cigarettes. If you need help quitting, ask your health care provider. ?Activity ? ?Follow a regular exercise program to stay fit. This will  help you maintain your balance. Ask your health care provider what types of exercise are appropriate for you. ?If you need a cane or walker, use it as recommended by your health care provider. ?Wear supportive shoes that have nonskid soles. ?Safety ? ?Remove any tripping hazards, such as rugs, cords, and clutter. ?Install safety equipment such as grab bars in bathrooms and safety rails on stairs. ?Keep rooms and walkways  well-lit. ?General instructions ?Talk with your health care provider about your risks for falling. Tell your health care provider if: ?You fall. Be sure to tell your health care provider about all falls, even ones that seem minor. ?You feel dizzy, tiredness (fatigue), or off-balance. ?Take over-the-counter and prescription medicines only as told by your health care provider. These include supplements. ?Eat a healthy diet and maintain a healthy weight. A healthy diet includes low-fat dairy products, low-fat (lean) meats, and fiber from whole grains, beans, and lots of fruits and vegetables. ?Stay current with your vaccines. ?Schedule regular health, dental, and eye exams. ?Summary ?Having a healthy lifestyle and getting preventive care can help to protect your health and wellness after age 47. ?Screening and testing are the best way to find a health problem early and help you avoid having a fall. Early diagnosis and treatment give you the best chance for managing medical conditions that are more common for people who are older than age 65. ?Falls are a major cause of broken bones and head injuries in people who are older than age 82. Take precautions to prevent a fall at home. ?Work with your health care provider to learn what changes you can make to improve your health and wellness and to prevent falls. ?This information is not intended to replace advice given to you by your health care provider. Make sure you discuss any questions you have with your health care provider. ?Document Revised: 03/16/2021 Document Reviewed: 03/16/2021 ?Elsevier Patient Education ? [place]. ? ?

## 2022-01-07 NOTE — Progress Notes (Signed)
This visit occurred during the SARS-CoV-2 public health emergency.  Safety protocols were in place, including screening questions prior to the visit, additional usage of staff PPE, and extensive cleaning of exam room while observing appropriate contact time as indicated for disinfecting solutions.    Patient ID: Jenny Hicks, female  DOB: 03/13/1951, 71 y.o.   MRN: [id number] Patient Care Team    Relationship Specialty Notifications Start End  Ma Hillock, DO PCP - General Family Medicine  09/16/16   Pixie Casino, MD PCP - Cardiology Cardiology  09/30/20   Pixie Casino, MD Consulting Physician Cardiology  09/16/16   Vania Rea, MD Consulting Physician Obstetrics and Gynecology  09/16/16   Newt Minion, MD Consulting Physician Orthopedic Surgery  11/28/18   Marti Sleigh, MD Referring Physician Gynecology  01/06/21     Chief Complaint  Patient presents with   Annual Exam    Pt is fasting    Subjective: BERTHE OLEY is a 71 y.o.  Female  present for CPE. All past medical history, surgical history, allergies, family history, immunizations, medications and social history were updated in the electronic medical record today. All recent labs, ED visits and hospitalizations within the last year were reviewed.  Health maintenance:  Colonoscopy: completed 2022- 3 yr follow up, by Dr. Silverio Decamp Mammogram: completed:has scheduled 01/26/2021. [facility]- [place]. FHx present in mother (prostate and ovarian cancers also in many members of family on paternal side)> scheduled.  Cervical cancer screening: Hysterectomy 1989 (non-cancerous reasons), routinely follows with Dr. Stann Mainland (GYN) >> having recto-bladder prolapse.  Immunizations: td 12/2017 UTD, Influenza UTD 2022(encouraged yearly), PNA series completed 2019.  zostavax completed. shingrix completed, covid x3 Infectious disease screening:Hep C and HIV completed DEXA: 01/26/2021, Osteopenia (-1.7).  [facility]-[place]. Vitamin D within normal limits.  3 yr. Scheduled 3/21. Assistive device: none Oxygen XLK:GMWN Patient has a Dental home. Hospitalizations/ED visits: reviewed  Depression screen [facility] 2/9 10/07/2021 01/06/2021 08/27/2020 01/01/2020 12/21/2018  Decreased Interest 0 0 0 0 0  Down, Depressed, Hopeless 0 0 0 0 0  PHQ - 2 Score 0 0 0 0 0   No flowsheet data found.  Immunization History  Administered Date(s) Administered   Influenza Split 08/26/2011, 08/28/2012   Influenza, High Dose Seasonal PF 07/18/2018, 09/03/2019   Influenza-Unspecified 08/24/2014, 09/01/2015, 07/25/2016, 08/22/2017, 09/03/2019, 09/09/2020, 08/19/2021   PFIZER(Purple Top)SARS-COV-2 Vaccination 12/17/2019, 01/09/2020, 08/22/2020, 08/12/2021   Pneumococcal Conjugate-13 10/08/2016   Pneumococcal Polysaccharide-23 03/15/2013, 12/15/2017   Td 12/15/2017   Tdap 11/09/2007   Zoster Recombinat (Shingrix) 01/22/2021, 05/28/2021   Zoster, Live 11/06/2011     Past Medical History:  Diagnosis Date   Benign cyst of kidney    Bone spur of right foot    Chicken pox    DDD (degenerative disc disease), cervical    Diverticulitis 10/06/2020   treatment given in ER   GERD (gastroesophageal reflux disease)    Heart palpitations    History of nuclear stress test 03/2013   stress myoview; normal study; SVT (HR 200) - adenosine   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 03/2013 Echo: EF 55-60%, mild MVP, mild to mod MR.   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  Recommended continued compression for symptomatic improvement.  She can continue Advil as needed for pain.  Recommend  no lifting more than 5 pounds for another 3 weeks and then gradual increase to tolerance.  She can move the wrist and the elbow to tolerance   Scoliosis    SVT (supraventricular tachycardia) ([place])    a. 03/2013  occurred during ex MV-->resolved with adenosine and carotid massage.   Allergies  Allergen Reactions   Hydrocodone-Acetaminophen Other (See Comments)    Made her BP go way up   Pantoprazole Sodium Palpitations   Past Surgical History:  Procedure Laterality Date   COLONOSCOPY  01/12/2011   DIAGNOSTIC MAMMOGRAM  08/06/2014   digital   TRANSTHORACIC ECHOCARDIOGRAM  03/2013   EF 55-60%, mild conc hypertrophy; mild MVP, mild-mod MR   UMBILICAL HERNIA REPAIR  03/02/2021   with colprpex   VAGINAL HYSTERECTOMY  1989   partial; pt has both ovaries   Family History  Problem Relation Age of Onset   Heart attack Mother    Hypertension Mother        Alzheimer's   Stroke Mother        Afib   Breast cancer Mother    Osteoporosis Mother        cervical/femur fracture   Kidney disease Mother    Myasthenia gravis Father    Prostate cancer Father    Prostate cancer Brother    Arthritis Maternal Aunt    Arthritis Maternal Uncle    Prostate cancer Maternal Uncle    Early death Maternal Grandmother        died after 2023-02-13 baby   Heart disease Maternal Grandfather    Congestive Heart Failure Maternal Grandfather    Ovarian cancer Paternal Grandmother    Other Daughter        mitral valve damaged had to be repaired, cause unknown   Breast cancer Cousin    Colon cancer Neg Hx    Esophageal cancer Neg Hx    Rectal cancer Neg Hx    Colon polyps Neg Hx    Stomach cancer Neg Hx    Social History   Social History Narrative   Married to [place], they have 5 children.    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   Drinks caffeine, takes a daily vitamin   Wears seatbelt, smoke detector in the home, no firearms in the home.    Feels safe in her relationships.     Allergies as of 01/07/2022       Reactions   Hydrocodone-acetaminophen Other (See Comments)   Made her BP go way up   Pantoprazole Sodium Palpitations        Medication List        Accurate as of  January 07, 2022  9:27 AM. If you have any questions, ask your nurse or doctor.          STOP taking these medications    amoxicillin-clavulanate 875-125 MG tablet Commonly known as: AUGMENTIN Stopped by: Howard Pouch, DO   naproxen 500 MG tablet Commonly known as: Naprosyn Stopped by: Howard Pouch, DO   ondansetron 4 MG tablet Commonly known as: ZOFRAN Stopped by: Howard Pouch, DO   tamsulosin 0.4 MG Caps capsule Commonly known as: FLOMAX Stopped by: Howard Pouch, DO       TAKE these medications    CALCIUM 600 + D PO Take 600 mg by mouth daily. Vit D 800 units   CENTRUM PO Take by mouth daily.   cyclobenzaprine 5 MG tablet Commonly known as:  FLEXERIL Take 1 tablet (5 mg total) by mouth 2 (two) times daily as needed for muscle spasms.   metoprolol tartrate 25 MG tablet Commonly known as: LOPRESSOR Take 25 mg by mouth daily. 1/2 tablet daily (12.5mg  daily)   Vitamin D (Cholecalciferol) 25 MCG (1000 UT) Tabs Take by mouth. 2,000 units 3x a week        All past medical history, surgical history, allergies, family history, immunizations andmedications were updated in the EMR today and reviewed under the history and medication portions of their EMR.       CT Abdomen Pelvis W Contrast Result Date: 10/15/2021  IMPRESSION: 2 mm nonobstructing right UVJ stone.  No hydronephrosis. Punctate left lower pole nephrolithiasis. Electronically Signed   By: Rolm Baptise M.D.   On: 10/15/2021 11:44    ROS 14 pt review of systems performed and negative (unless mentioned in an HPI)  Objective: BP 118/67    Pulse 65    Temp 97.6 F (36.4 C) (Oral)    Ht 5\' 3"  (1.6 m)    Wt 163 lb (73.9 kg)    SpO2 98%    BMI 28.87 kg/m  Physical Exam Vitals and nursing note reviewed.  Constitutional:      General: She is not in acute distress.    Appearance: Normal appearance. She is not ill-appearing or toxic-appearing.  HENT:     Head: Normocephalic and atraumatic.     Right Ear:  Tympanic membrane, ear canal and external ear normal. There is no impacted cerumen.     Left Ear: Tympanic membrane, ear canal and external ear normal. There is no impacted cerumen.     Nose: No congestion or rhinorrhea.     Mouth/Throat:     Mouth: Mucous membranes are moist.     Pharynx: Oropharynx is clear. No oropharyngeal exudate or posterior oropharyngeal erythema.  Eyes:     General: No scleral icterus.       Right eye: No discharge.        Left eye: No discharge.     Extraocular Movements: Extraocular movements intact.     Conjunctiva/sclera: Conjunctivae normal.     Pupils: Pupils are equal, round, and reactive to light.  Cardiovascular:     Rate and Rhythm: Normal rate and regular rhythm.     Pulses: Normal pulses.     Heart sounds: Normal heart sounds. No murmur heard.   No friction rub. No gallop.  Pulmonary:     Effort: Pulmonary effort is normal. No respiratory distress.     Breath sounds: Normal breath sounds. No stridor. No wheezing, rhonchi or rales.  Chest:     Chest wall: No tenderness.  Abdominal:     General: Abdomen is flat. Bowel sounds are normal. There is no distension.     Palpations: Abdomen is soft. There is no mass.     Tenderness: There is no abdominal tenderness. There is no right CVA tenderness, left CVA tenderness, guarding or rebound.     Hernia: No hernia is present.  Musculoskeletal:        General: No swelling, tenderness or deformity. Normal range of motion.     Cervical back: Normal range of motion and neck supple. No rigidity or tenderness.     Right lower leg: No edema.     Left lower leg: No edema.  Lymphadenopathy:     Cervical: No cervical adenopathy.  Skin:    General: Skin is warm and dry.  Coloration: Skin is not jaundiced or pale.     Findings: Lesion (rough raised flesh toned lesion right cheek) present. No bruising, erythema or rash.  Neurological:     General: No focal deficit present.     Mental Status: She is alert and  oriented to person, place, and time. Mental status is at baseline.     Cranial Nerves: No cranial nerve deficit.     Sensory: No sensory deficit.     Motor: No weakness.     Coordination: Coordination normal.     Gait: Gait normal.     Deep Tendon Reflexes: Reflexes normal.  Psychiatric:        Mood and Affect: Mood normal.        Behavior: Behavior normal.        Thought Content: Thought content normal.        Judgment: Judgment normal.     No results found.  Assessment/plan: CARLISLE TORGESON is a 71 y.o. female present for CPE  Paroxysmal SVT (supraventricular tachycardia) (HCC)/MVP/MI Managed by cardio on metoprolol (agreed to take over script if she desires- she does not need refills at this time) - TSH  Osteopenia, unspecified location/Vitamin D deficiency - VITAMIN D 25 Hydroxy (Vit-D Deficiency, Fractures  Hyperlipidemia, unspecified hyperlipidemia type/Overweight (BMI 25.0-29.9) - CBC - Comprehensive metabolic panel - Lipid panel - TSH  Diabetes mellitus screening - Hemoglobin A1c  Skin lesion of face SK vs SCC> referred  - Ambulatory referral to Dermatology Encounter for preventive health examination Colonoscopy: completed 2022- 3 yr follow up, by Dr. Silverio Decamp Mammogram: completed:has scheduled 01/26/2021. [facility]- [place]. FHx present in mother (prostate and ovarian cancers also in many members of family on paternal side)> scheduled.  Cervical cancer screening: Hysterectomy 1989 (non-cancerous reasons), routinely follows with Dr. Stann Mainland (GYN) >> having recto-bladder prolapse.  Immunizations: td 12/2017 UTD, Influenza UTD 2022(encouraged yearly), PNA series completed 2019.  zostavax completed. shingrix completed, covid x3 Infectious disease screening:Hep C and HIV completed DEXA: 01/26/2021, Osteopenia (-1.7).  [facility]. Vitamin D within normal limits. 3 yr. Scheduled 3/21. Patient was encouraged to exercise greater than 150 minutes a week. Patient was encouraged to choose a diet  filled with fresh fruits and vegetables, and lean meats. AVS provided to patient today for education/recommendation on gender specific health and safety maintenance. Return in about 1 year (around 01/08/2023) for CPE (30 min).  Orders Placed This Encounter  Procedures   CBC   Comprehensive metabolic panel   Hemoglobin A1c   Lipid panel   TSH   VITAMIN D 25 Hydroxy (Vit-D Deficiency, Fractures)   Ambulatory referral to Dermatology    Orders Placed This Encounter  Procedures   CBC   Comprehensive metabolic panel   Hemoglobin A1c   Lipid panel   TSH   VITAMIN D 25 Hydroxy (Vit-D Deficiency, Fractures)   Ambulatory referral to Dermatology   No orders of the defined types were placed in this encounter.  Referral Orders         Ambulatory referral to Dermatology       Electronically signed by: Howard Pouch, DO [place]

## 2022-01-27 ENCOUNTER — Ambulatory Visit
Admission: RE | Admit: 2022-01-27 | Discharge: 2022-01-27 | Disposition: A | Discharge status: Home / Self Care | Payer: PPO | Source: Ambulatory Visit | Attending: Family Medicine | Admitting: Family Medicine

## 2022-01-27 ENCOUNTER — Other Ambulatory Visit: Payer: Self-pay | Admitting: Obstetrics & Gynecology

## 2022-01-27 DIAGNOSIS — Z1231 Encounter for screening mammogram for malignant neoplasm of breast: Secondary | ICD-10-CM

## 2022-01-27 DIAGNOSIS — Z6827 Body mass index (BMI) 27.0-27.9, adult: Secondary | ICD-10-CM | POA: Diagnosis not present

## 2022-01-27 DIAGNOSIS — D481 Neoplasm of uncertain behavior of connective and other soft tissue: Secondary | ICD-10-CM | POA: Diagnosis not present

## 2022-01-27 DIAGNOSIS — Z01419 Encounter for gynecological examination (general) (routine) without abnormal findings: Secondary | ICD-10-CM | POA: Diagnosis not present

## 2022-01-27 LAB — HM PAP SMEAR

## 2022-01-27 LAB — HM MAMMOGRAPHY

## 2022-04-20 ENCOUNTER — Ambulatory Visit (INDEPENDENT_AMBULATORY_CARE_PROVIDER_SITE_OTHER): Payer: PPO

## 2022-04-20 ENCOUNTER — Telehealth: Payer: Self-pay | Admitting: Family Medicine

## 2022-04-20 ENCOUNTER — Ambulatory Visit (INDEPENDENT_AMBULATORY_CARE_PROVIDER_SITE_OTHER): Payer: PPO | Admitting: Family Medicine

## 2022-04-20 VITALS — BP 125/69 | HR 81 | Temp 98.0°F | Ht 63.0 in | Wt 161.0 lb

## 2022-04-20 DIAGNOSIS — M7731 Calcaneal spur, right foot: Secondary | ICD-10-CM | POA: Diagnosis not present

## 2022-04-20 DIAGNOSIS — M79671 Pain in right foot: Secondary | ICD-10-CM | POA: Diagnosis not present

## 2022-04-20 DIAGNOSIS — L820 Inflamed seborrheic keratosis: Secondary | ICD-10-CM | POA: Diagnosis not present

## 2022-04-20 DIAGNOSIS — M7730 Calcaneal spur, unspecified foot: Secondary | ICD-10-CM | POA: Diagnosis not present

## 2022-04-20 DIAGNOSIS — M19071 Primary osteoarthritis, right ankle and foot: Secondary | ICD-10-CM | POA: Diagnosis not present

## 2022-04-20 NOTE — Patient Instructions (Signed)
Heel Spur  A heel spur is a bony growth that forms on the bottom of the heel bone (calcaneus). Heel spurs are common. They often cause inflammation in the plantar fascia, which is the band of tissue that connects the toe bones to the heel bone. When the plantar fascia is inflamed, it is called plantar fasciitis. This may cause pain on the bottom of the foot, near the heel. Many people with plantar fasciitis also have heel spurs. However, spurs are not the cause of plantar fasciitis pain. What are the causes? The exact cause of heel spurs is not known. They may be caused by: Pressure on the heel bone. Bands of tissues that connect muscle to bone (tendons) pulling on the heel bone. What increases the risk? You are more likely to develop this condition if you: Are older than age 40. Are overweight. Have wear-and-tear arthritis (osteoarthritis). Have plantar fascia inflammation. Participate in sports or activities that include a lot of running or jumping. Wear poorly fitted shoes. What are the signs or symptoms? Some people have no symptoms. If you do have symptoms, they may include: Pain in the bottom of your heel. Pain that is worse when you first get out of bed. Pain that gets worse after walking or standing. How is this diagnosed? This condition may be diagnosed based on: Your symptoms and medical history. A physical exam. A foot X-ray. How is this treated? Treatment for this condition depends on how much pain you have. Treatment options may include: Doing stretching exercises and losing weight, if necessary. Wearing specific shoes or inserts inside of shoes (orthotics) for comfort and support. Wearing splints on your feet while you sleep. Splints keep your feet in a position (usually a 90-degree angle) that should prevent and relieve the pain you feel when you first get out of bed. They also make stretching easier in the morning. Taking over-the-counter medicine to relieve pain, such  as NSAIDs. Using high-intensity sound waves to break up the heel spur (extracorporeal shock wave therapy). Getting steroid injections in your heel to reduce inflammation. Having surgery, if your heel spur causes long-term (chronic) pain. Follow these instructions at home:  Activity Avoid activities that cause pain until you recover, or for as long as told by your health care provider. Do stretching exercises as told. Stretch before exercising or being physically active. Managing pain, stiffness, and swelling If directed, put ice on your foot. To do this: Put ice in a plastic bag. Place a towel between your skin and the bag. Leave the ice on for 20 minutes, 2-3 times a day. Remove the ice if your skin turns bright red. This is very important. If you cannot feel pain, heat, or cold, you have a greater risk of damage to the area. Move your toes often to reduce stiffness and swelling. When possible, raise (elevate) your foot above the level of your heart while you are sitting or lying down. General instructions Take over-the-counter and prescription medicines only as told by your health care provider. Wear supportive shoes that fit well. Wear splints, inserts, or orthotics as told by your health care provider. If recommended, work with your health care provider to lose weight. This can relieve pressure on your foot. Do not use any products that contain nicotine or tobacco, such as cigarettes, e-cigarettes, and chewing tobacco. These can delay bone healing. If you need help quitting, ask your health care provider. Keep all follow-up visits. This is important. Where to find more information [employer]   [employer]: www.orthoinfo.aaos.org Contact a health care provider if: Your pain does not go away with treatment. Your pain gets worse. Summary A heel spur is a bony growth that forms on the bottom of the heel bone (calcaneus). Heel spurs often cause inflammation in the plantar  fascia, which is the band of tissue that connects the toes to the heel bone. This may cause pain on the bottom of the foot, near the heel. Doing stretching exercises, losing weight, wearing specific shoes or shoe inserts, wearing splints while you sleep, and taking pain medicine may ease the pain and stiffness. Other treatment options may include high-intensity sound waves to break up the heel spur, steroid injections, or surgery. This information is not intended to replace advice given to you by your health care provider. Make sure you discuss any questions you have with your health care provider. Document Revised: 02/19/2020 Document Reviewed: 02/19/2020 Elsevier Patient Education  2023 [employer]  

## 2022-04-20 NOTE — Progress Notes (Signed)
Jenny Hicks , 1950/12/28, 71 y.o., female MRN: [id number] Patient Care Team    Relationship Specialty Notifications Start End  Ma Hillock, DO PCP - General Family Medicine  09/16/16   Pixie Casino, MD PCP - Cardiology Cardiology  09/30/20   Pixie Casino, MD Consulting Physician Cardiology  09/16/16   Vania Rea, MD Consulting Physician Obstetrics and Gynecology  09/16/16   Newt Minion, MD Consulting Physician Orthopedic Surgery  11/28/18   Marti Sleigh, MD Referring Physician Gynecology  01/06/21     Chief Complaint  Patient presents with   Foot Pain    Pt c/o R heal pain and swelling in the ankle x 3 mos;      Subjective: Pt presents for an OV with complaints of right foot pain for a couple months.  She states it all started when she was in [place] and they were walking a great deal daily.  She reports the last week she was there she could not even walk without pain.  She tried to get a different shoe that had more support on the bottom and still was uncomfortable.  She tried NSAIDs, but it upset her stomach and she noticed a small amount of bleeding in her stool.  She has had plantar fasciitis in the past, but she states this is different and the pain is in a specific location.  She also endorses having some mild swelling in that foot as well.     10/07/2021   11:52 AM 01/06/2021    1:55 PM 08/27/2020    3:44 PM 01/01/2020    8:29 AM 12/21/2018    8:34 AM  Depression screen PHQ 2/9  Decreased Interest 0 0 0 0 0  Down, Depressed, Hopeless 0 0 0 0 0  PHQ - 2 Score 0 0 0 0 0    Allergies  Allergen Reactions   Hydrocodone-Acetaminophen Other (See Comments)    Made her BP go way up   Pantoprazole Sodium Palpitations   Social History   Social History Narrative   Married to [place], they have 5 children.    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   Drinks caffeine, takes a daily vitamin   Wears seatbelt, smoke detector in  the home, no firearms in the home.    Feels safe in her relationships.    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 03/2013 Echo: EF 55-60%, mild MVP, mild to mod MR.   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  Recommended continued compression for symptomatic improvement.  She can continue Advil as needed for pain.  Recommend no lifting more than 5 pounds for another 3 weeks and then gradual increase to tolerance.  She can move the wrist and the elbow to tolerance  Scoliosis    SVT (supraventricular tachycardia) ([place])    a. 03/2013 occurred during ex MV-->resolved with adenosine and carotid massage.   Past Surgical History:  Procedure Laterality Date   COLONOSCOPY  01/12/2011   DIAGNOSTIC MAMMOGRAM  08/06/2014   digital   TRANSTHORACIC ECHOCARDIOGRAM  03/2013   EF 55-60%, mild conc hypertrophy; mild MVP, mild-mod MR   UMBILICAL HERNIA REPAIR  03/02/2021   with colprpex   VAGINAL HYSTERECTOMY  1989   partial; pt has both ovaries   Family History  Problem Relation Age of Onset   Heart attack Mother    Hypertension Mother        Alzheimer's   Stroke Mother        Afib   Breast cancer Mother    Osteoporosis Mother        cervical/femur fracture   Kidney disease Mother    Myasthenia gravis Father    Prostate cancer Father    Prostate cancer Brother    Arthritis Maternal Aunt    Arthritis Maternal Uncle    Prostate cancer Maternal Uncle     Early death Maternal Grandmother        died after 24-Jan-2023 baby   Heart disease Maternal Grandfather    Congestive Heart Failure Maternal Grandfather    Ovarian cancer Paternal Grandmother    Other Daughter        mitral valve damaged had to be repaired, cause unknown   Breast cancer Cousin    Colon cancer Neg Hx    Esophageal cancer Neg Hx    Rectal cancer Neg Hx    Colon polyps Neg Hx    Stomach cancer Neg Hx    Allergies as of 04/20/2022       Reactions   Hydrocodone-acetaminophen Other (See Comments)   Made her BP go way up   Pantoprazole Sodium Palpitations        Medication List        Accurate as of April 20, 2022  4:05 PM. If you have any questions, ask your nurse or doctor.          CALCIUM 600 + D PO Take 600 mg by mouth daily. Vit D 800 units   CENTRUM PO Take by mouth daily.   cyclobenzaprine 5 MG tablet Commonly known as: FLEXERIL Take 1 tablet (5 mg total) by mouth 2 (two) times daily as needed for muscle spasms.   metoprolol tartrate 25 MG tablet Commonly known as: LOPRESSOR Take 25 mg by mouth daily. 1/2 tablet daily (12.'5mg'$  daily)   Vitamin D (Cholecalciferol) 25 MCG (1000 UT) Tabs Take by mouth. 2,000 units 3x a week        All past medical history, surgical history, allergies, family history, immunizations andmedications were updated in the EMR today and reviewed under the history and medication portions of their EMR.     ROS Negative, with the exception of above mentioned in HPI   Objective:  BP 125/69   Pulse 81   Temp 98 F (36.7 C) (Oral)   Ht '5\' 3"'$  (1.6 m)   Wt 161 lb (73 kg)   SpO2 95%   BMI 28.52 kg/m  Body mass index is 28.52 kg/m. Physical Exam Vitals and nursing note reviewed.  Constitutional:      General: She is not in acute distress.    Appearance: Normal appearance. She is normal weight. She is not ill-appearing or toxic-appearing.  Eyes:     Extraocular Movements: Extraocular movements intact.  Conjunctiva/sclera: Conjunctivae normal.     Pupils: Pupils are equal, round, and reactive to light.  Musculoskeletal:     Comments: Right foot: No erythema.  Mild swelling present.  Tender to palpation anterior mid calcaneus.  Skin is intact.  No other bony tenderness.  Full range of motion.  Neurovascular intact distally  Neurological:     Mental Status: She is alert and oriented to person, place, and time. Mental status is at baseline.  Psychiatric:        Mood and Affect: Mood normal.        Behavior: Behavior normal.        Thought Content: Thought content normal.        Judgment: Judgment normal.     No results found. No results found. No results found for this or any previous visit (from the past 24 hour(s)).  Assessment/Plan: Jenny Hicks is a 71 y.o. female present for OV for  Right foot pain/ Calcaneal spur of foot, right Rest.  Keep foot elevated when possible.  Ice a few times a day. NSAIDs cause stomach upset, preferably would recommend steroid injection at site of calcaneal spur.  We will wait on x-ray and refer to sports medicine if appropriate. If no spurs identified could consider oral steroids at that time. - DG Os Calcis Right; Future> reviewed x-ray.  Calcaneal spur is present.  Patient was referred to sports med. - DG Foot Complete Right; Future - Ambulatory referral to Sports Medicine   Reviewed expectations re: course of current medical issues. Discussed self-management of symptoms. Outlined signs and symptoms indicating need for more acute intervention. Patient verbalized understanding and all questions were answered. Patient received an After-Visit Summary.    Orders Placed This Encounter  Procedures   DG Os Calcis Right   DG Foot Complete Right   Ambulatory referral to Sports Medicine   No orders of the defined types were placed in this encounter.  Referral Orders         Ambulatory referral to Sports Medicine       Note is dictated  utilizing voice recognition software. Although note has been proof read prior to signing, occasional typographical errors still can be missed. If any questions arise, please do not hesitate to call for verification.   electronically signed by:  Howard Pouch, DO  [place]

## 2022-04-20 NOTE — Telephone Encounter (Signed)
Please inform patient of the following: I have reviewed her x-ray and there is a rather significant bone spur on her heel.  I am waiting on the formal read, but I suspect this is what is causing her pain. I have placed a referral to sports medicine with [facility] [address]  They typically can get patients in pretty quickly for [place].  Ideally steroid injection at location of discomfort would be best.  In the meantime use the ice as we discussed, keep foot elevated whenever possible, wear well supported shoes.

## 2022-04-20 NOTE — Telephone Encounter (Signed)
Spoke with pt regarding labs and instructions.   

## 2022-04-27 ENCOUNTER — Ambulatory Visit: Payer: Self-pay

## 2022-04-27 ENCOUNTER — Ambulatory Visit: Payer: PPO | Admitting: Family Medicine

## 2022-04-27 VITALS — BP 162/86 | HR 76 | Ht 63.0 in | Wt 161.8 lb

## 2022-04-27 DIAGNOSIS — G8929 Other chronic pain: Secondary | ICD-10-CM | POA: Diagnosis not present

## 2022-04-27 DIAGNOSIS — M79671 Pain in right foot: Secondary | ICD-10-CM | POA: Diagnosis not present

## 2022-04-27 NOTE — Progress Notes (Signed)
   I, Jenny Hicks, LAT, ATC acting as a scribe for Jenny Leader, MD.  Subjective:    CC: R foot pain  HPI: Pt is a 71 y/o female c/o R foot pain ongoing for about 3+ months. 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 Pt has had plantar fascitis in the past and notes that this pain is different. Pt locates pain to the plantar, medial, and lateral aspect of the R heel. Pt c/o a throbbing pain.  R foot swelling: yes Aggravates: increased activity, any pressure on heel Treatments tried: changing shoes, NSAID  Dx imaging: 04/20/22 R Os calcis & foot XR  Pertinent review of Systems: No fevers or chills  Relevant historical information: SVT   Objective:    Vitals:   04/27/22 1400  BP: (!) 162/86  Pulse: 76  SpO2: 95%   General: Well Developed, well nourished, and in no acute distress.   MSK: Right foot: Normal. Tender palpation plantar calcaneus.  Normal foot and ankle motion. Pulses cap refill and sensation are intact. Strength is intact to ankle and foot motion.  Lab and Radiology Results  Diagnostic Limited MSK Ultrasound of: Right calcaneus Plantar fascia origin visualized measuring 0.5 cm without visible tear. Area of maximum tenderness is at the plantar calcaneus at plantar fascia origin. Bony cortex there is normal-appearing At plantar fascia origin there is a slight area of hyperechoic change consistent with calcific tendinopathy. Achilles tendon is normal-appearing at posterior calcaneus Impression: Planter fasciitis     EXAM: RIGHT OS CALCIS - 2+ VIEW; RIGHT FOOT COMPLETE - 3+ VIEW   COMPARISON:  None Available.   FINDINGS: Mild joint space narrowing of the interphalangeal joints diffusely. Moderate first tarsometatarsal joint space narrowing, subchondral sclerosis and peripheral osteophytosis. Mild-to-moderate second tarsometatarsal joint space narrowing. Moderate plantar  calcaneal heel spur.   No acute fracture or dislocation.   IMPRESSION: 1. Moderate plantar calcaneal heel spur. 2. 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     Electronically Signed   By: Yvonne Kendall M.D.   On: 04/21/2022 15: ILynne Hicks, personally (independently) visualized and performed the interpretation of the images attached in this note.   Impression and Recommendations:    Assessment and Plan: 71 y.o. female with right plantar heel pain thought to be due to planter fasciitis or heel capsulitis.  Plan for heel cushioning night splint eccentric exercises and ice massage.  Check back in 6 weeks.Marland Kitchen  PDMP not reviewed this encounter. Orders Placed This Encounter  Procedures   [place] LIMITED JOINT SPACE STRUCTURES LOW RIGHT(NO LINKED CHARGES)    Order Specific Question:   Reason for Exam (SYMPTOM  OR DIAGNOSIS REQUIRED)    Answer:   right heel pain    Order Specific Question:   Preferred imaging location?    Answer:   [place]   No orders of the defined types were placed in this encounter.   Discussed warning signs or symptoms. Please see discharge instructions. Patient expresses understanding.   The above documentation has been reviewed and is accurate and complete Jenny Hicks, M.D.

## 2022-04-27 NOTE — Patient Instructions (Addendum)
Thank you for coming in today.   Try using a gel heel cup in your shoes  Night splints  Ice massage  Please complete the exercises that the athletic trainer went over with you:  View at [email or web address]-exercise-code.com using code: [id number]  Check back in 6 weeks

## 2022-05-06 DIAGNOSIS — N952 Postmenopausal atrophic vaginitis: Secondary | ICD-10-CM | POA: Diagnosis not present

## 2022-05-06 DIAGNOSIS — Z8742 Personal history of other diseases of the female genital tract: Secondary | ICD-10-CM | POA: Diagnosis not present

## 2022-06-07 NOTE — Progress Notes (Unsigned)
   I, Peterson Lombard, LAT, ATC acting as a scribe for Lynne Leader, MD.  Jenny Hicks is a 71 y.o. female who presents to [place] at [facility] today for f/u R plantar heel pain thought to be due to planter fasciitis or heel capsulitis.. 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 Pt was last seen by Dr. Georgina Snell on 04/27/22 and was advised to use heel cushioning, night splints, ice massage, and was taught eccentric HEP. Today, pt reports  Dx imaging: 04/20/22 R Os calcis & foot XR  Pertinent review of systems: ***  Relevant historical information: ***   Exam:  There were no vitals taken for this visit. General: Well Developed, well nourished, and in no acute distress.   MSK: ***    Lab and Radiology Results No results found for this or any previous visit (from the past 72 hour(s)). No results found.     Assessment and Plan: 71 y.o. female with ***   PDMP not reviewed this encounter. No orders of the defined types were placed in this encounter.  No orders of the defined types were placed in this encounter.    Discussed warning signs or symptoms. Please see discharge instructions. Patient expresses understanding.   ***

## 2022-06-08 ENCOUNTER — Ambulatory Visit: Payer: Self-pay

## 2022-06-08 ENCOUNTER — Ambulatory Visit: Payer: PPO | Admitting: Family Medicine

## 2022-06-08 VITALS — BP 128/80 | HR 101 | Ht 63.0 in | Wt 162.0 lb

## 2022-06-08 DIAGNOSIS — M79671 Pain in right foot: Secondary | ICD-10-CM | POA: Diagnosis not present

## 2022-06-08 DIAGNOSIS — G8929 Other chronic pain: Secondary | ICD-10-CM | POA: Diagnosis not present

## 2022-06-08 NOTE — Patient Instructions (Addendum)
Good to see you   Call or go to the ER if you develop a large red swollen joint with extreme pain or oozing puss.

## 2022-06-15 ENCOUNTER — Telehealth: Payer: Self-pay | Admitting: Family Medicine

## 2022-06-15 DIAGNOSIS — G8929 Other chronic pain: Secondary | ICD-10-CM

## 2022-06-15 NOTE — Telephone Encounter (Signed)
Pt would like to get a MRI R foot/heel. Pain has been ongoing since March and injection last week did not provide any relief.

## 2022-06-16 NOTE — Telephone Encounter (Signed)
MRI ordered.  You should hear soon about scheduling.

## 2022-06-19 ENCOUNTER — Ambulatory Visit (INDEPENDENT_AMBULATORY_CARE_PROVIDER_SITE_OTHER): Payer: PPO

## 2022-06-19 DIAGNOSIS — G8929 Other chronic pain: Secondary | ICD-10-CM | POA: Diagnosis not present

## 2022-06-19 DIAGNOSIS — M79671 Pain in right foot: Secondary | ICD-10-CM | POA: Diagnosis not present

## 2022-06-19 DIAGNOSIS — M7731 Calcaneal spur, right foot: Secondary | ICD-10-CM | POA: Diagnosis not present

## 2022-06-22 NOTE — Progress Notes (Signed)
Right heel MRI shows planter fasciitis with a heel spur and bone marrow edema of the heel spur and the heel itself.  You additionally have arthritis in the midfoot as well as some tendinitis.  The main source of your pain is probably the planter fasciitis and the bone marrow edema. Recommend return to clinic in the near future to talk about treatment plan and options.  We may want to consider a cam walker boot for a little while.

## 2022-07-08 ENCOUNTER — Telehealth: Payer: Self-pay | Admitting: Family Medicine

## 2022-07-08 DIAGNOSIS — Z13820 Encounter for screening for osteoporosis: Secondary | ICD-10-CM

## 2022-07-08 NOTE — Telephone Encounter (Signed)
Patient is not due for her bone density until March 2024.

## 2022-07-08 NOTE — Telephone Encounter (Signed)
Pt is requesting referral for bone density scan. Please contact patient with further details.

## 2022-07-08 NOTE — Telephone Encounter (Signed)
Please advise on order

## 2022-07-08 NOTE — Telephone Encounter (Signed)
Pt would like to place as future is that okay?

## 2022-07-14 NOTE — Telephone Encounter (Signed)
Order placed at [facility]

## 2022-07-14 NOTE — Telephone Encounter (Signed)
It is okay with me.

## 2022-07-14 NOTE — Addendum Note (Signed)
Addended by: Beatrix Fetters on: 07/14/2022 03:04 PM   Modules accepted: Orders

## 2022-07-27 ENCOUNTER — Other Ambulatory Visit: Payer: Self-pay | Admitting: Family Medicine

## 2022-07-27 DIAGNOSIS — Z1231 Encounter for screening mammogram for malignant neoplasm of breast: Secondary | ICD-10-CM

## 2022-09-03 DIAGNOSIS — R350 Frequency of micturition: Secondary | ICD-10-CM | POA: Diagnosis not present

## 2022-09-03 DIAGNOSIS — R000 Tachycardia, unspecified: Secondary | ICD-10-CM | POA: Diagnosis not present

## 2022-09-03 DIAGNOSIS — R319 Hematuria, unspecified: Secondary | ICD-10-CM | POA: Diagnosis not present

## 2022-09-04 DIAGNOSIS — Z8719 Personal history of other diseases of the digestive system: Secondary | ICD-10-CM | POA: Diagnosis not present

## 2022-09-04 DIAGNOSIS — R109 Unspecified abdominal pain: Secondary | ICD-10-CM | POA: Diagnosis not present

## 2022-09-07 DIAGNOSIS — K219 Gastro-esophageal reflux disease without esophagitis: Secondary | ICD-10-CM | POA: Diagnosis not present

## 2022-10-20 ENCOUNTER — Ambulatory Visit (INDEPENDENT_AMBULATORY_CARE_PROVIDER_SITE_OTHER): Payer: PPO

## 2022-10-20 DIAGNOSIS — Z0000 Encounter for general adult medical examination without abnormal findings: Secondary | ICD-10-CM

## 2022-10-20 NOTE — Progress Notes (Addendum)
Subjective:   Jenny Hicks is a 71 y.o. female who presents for Medicare Annual (Subsequent) preventive examination.  I connected with  Jenny Hicks on 10/20/22 by an audio only telemedicine application and verified that I am speaking with the correct person using two identifiers.   I discussed the limitations, risks, security and privacy concerns of performing an evaluation and management service by telephone and the availability of in person appointments. I also discussed with the patient that there may be a patient responsible charge related to this service. The patient expressed understanding and verbally consented to this telephonic visit.  Location of Patient: home Location of Provider:office  List any persons and their role that are participating in the visit with the patient.   [place], CMA  Review of Systems    Defer to PCP       Objective:    There were no vitals filed for this visit. There is no height or weight on file to calculate BMI.     10/20/2022   11:30 AM 10/07/2021   11:53 AM 08/27/2020    3:42 PM 11/28/2018    3:06 PM  Advanced Directives  Does Patient Have a Medical Advance Directive? No Yes No No  Does patient want to make changes to medical advance directive?  Yes (MAU/Ambulatory/Procedural Areas - Information given)    Would patient like information on creating a medical advance directive? No - Patient declined  No - Patient declined Yes (MAU/Ambulatory/Procedural Areas - Information given)    Current Medications (verified) Outpatient Encounter Medications as of 10/20/2022  Medication Sig   Calcium Carb-Cholecalciferol (CALCIUM 600 + D PO) Take 600 mg by mouth daily. Vit D 800 units   metoprolol tartrate (LOPRESSOR) 25 MG tablet Take 25 mg by mouth daily. 1/2 tablet daily (12.'5mg'$  daily)   Multiple Vitamins-Minerals (CENTRUM PO) Take by mouth daily.   Vitamin D, Cholecalciferol, 25 MCG (1000 UT) TABS Take by mouth. 2,000  units 3x a week   No facility-administered encounter medications on file as of 10/20/2022.    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 03/2013 Echo: EF 55-60%, mild MVP, mild to mod MR.   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  Recommended continued compression for symptomatic improvement.  She can continue Advil as needed for pain.  Recommend no lifting more than 5 pounds for another 3 weeks and then gradual increase to tolerance.  She can move the wrist and the elbow to tolerance   Scoliosis    SVT (supraventricular tachycardia)    a. 03/2013 occurred during ex MV-->resolved with adenosine and carotid massage.   Past Surgical History:  Procedure Laterality Date   COLONOSCOPY  01/12/2011   DIAGNOSTIC MAMMOGRAM  08/06/2014   digital   TRANSTHORACIC ECHOCARDIOGRAM  03/2013  EF 55-60%, mild conc hypertrophy; mild MVP, mild-mod MR   UMBILICAL HERNIA REPAIR  03/02/2021   with colprpex   VAGINAL HYSTERECTOMY  1989   partial; pt has both ovaries   Family History  Problem Relation Age of Onset   Heart attack Mother    Hypertension Mother        Alzheimer's   Stroke Mother        Afib   Breast cancer Mother    Osteoporosis Mother        cervical/femur fracture   Kidney disease Mother    Myasthenia gravis Father    Prostate cancer Father    Prostate cancer  Brother    Arthritis Maternal Aunt    Arthritis Maternal Uncle    Prostate cancer Maternal Uncle    Early death Maternal Grandmother        died after 22-Jan-2023 baby   Heart disease Maternal Grandfather    Congestive Heart Failure Maternal Grandfather    Ovarian cancer Paternal Grandmother    Other Daughter        mitral valve damaged had to be repaired, cause unknown   Breast cancer Cousin    Colon cancer Neg Hx    Esophageal cancer Neg Hx    Rectal cancer Neg Hx    Colon polyps Neg Hx    Stomach cancer Neg Hx    Social History   Socioeconomic History   Marital status: Married    Spouse name: Richardson Landry   Number of children: 5   Years of education: 16   Highest education level: Bachelor's degree (e.g., BA, AB, BS)  Occupational History   Occupation: retired Therapist, sports  Tobacco Use   Smoking status: Never   Smokeless tobacco: Never  Vaping Use   Vaping Use: Never used  Substance and Sexual Activity   Alcohol use: No   Drug use: No   Sexual activity: Yes    Partners: Male    Comment: married  Other Topics Concern   Not on file  Social History Narrative   Married to [place], they have 5 children.    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   Drinks caffeine, takes a daily vitamin   Wears seatbelt, smoke detector in the home, no firearms in the home.    Feels safe in her relationships.    Social Determinants of Health   Financial Resource Strain: Unknown (10/20/2022)   Overall Financial Resource Strain (CARDIA)    Difficulty of Paying Living Expenses: Patient refused  Food Insecurity: No Food Insecurity (10/20/2022)   Hunger Vital Sign    Worried About Running Out of Food in the Last Year: Never true    Ran Out of Food in the Last Year: Never true  Transportation Needs: No Transportation Needs (10/20/2022)   PRAPARE - Hydrologist (Medical): No    Lack of Transportation (Non-Medical): No  Physical Activity: Insufficiently  Active (10/20/2022)   Exercise Vital Sign    Days of Exercise per Week: 4 days    Minutes of Exercise per Session: 30 min  Stress: No Stress Concern Present (10/20/2022)   [place]    Feeling of Stress : Not at all  Social Connections: Moderately Integrated (10/20/2022)   Social Connection and Isolation Panel [NHANES]    Frequency of Communication with Friends and Family: More than three times a week  Frequency of Social Gatherings with Friends and Family: More than three times a week    Attends Religious Services: More than 4 times per year    Active Member of [employer] or Organizations: No    Attends Music therapist: Never    Marital Status: Married    Tobacco Counseling Counseling given: Not Answered   Clinical Intake:     Pain : No/denies pain     Nutritional Risks: None Diabetes: No  How often do you need to have someone help you when you read instructions, pamphlets, or other written materials from your doctor or pharmacy?: 1 - Never  Diabetic?no   Interpreter Needed?: No      Activities of Daily Living    10/20/2022   11:30 AM  In your present state of health, do you have any difficulty performing the following activities:  Hearing? 0  Vision? 0  Difficulty concentrating or making decisions? 0  Walking or climbing stairs? 0  Dressing or bathing? 0  Doing errands, shopping? 0  Preparing Food and eating ? N  Using the Toilet? N  In the past six months, have you accidently leaked urine? N  Do you have problems with loss of bowel control? N  Managing your Medications? N  Managing your Finances? N  Housekeeping or managing your Housekeeping? N    Patient Care Team: Ma Hillock, DO as PCP - General (Family Medicine) Debara Pickett Nadean Corwin, MD as PCP - Cardiology (Cardiology) Debara Pickett Nadean Corwin, MD as Consulting Physician (Cardiology) Vania Rea, MD as Consulting Physician  (Obstetrics and Gynecology) Newt Minion, MD as Consulting Physician (Orthopedic Surgery) Marti Sleigh, MD as Referring Physician (Gynecology)  Indicate any recent Medical Services you may have received from other than [facility] providers in the past year (date may be approximate).     Assessment:   This is a routine wellness examination for Jenny Hicks.  Hearing/Vision screen No results found.  Dietary issues and exercise activities discussed: Current Exercise Habits: Home exercise routine   Goals Addressed             This Visit's Progress    Quality of Life Maintained       Walk more      Depression Screen    10/20/2022   10:53 AM 10/07/2021   11:52 AM 01/06/2021    1:55 PM 08/27/2020    3:44 PM 01/01/2020    8:29 AM 12/21/2018    8:34 AM 11/28/2018    3:07 PM  PHQ 2/9 Scores  PHQ - 2 Score 0 0 0 0 0 0 0    Fall Risk    10/20/2022   11:30 AM 04/20/2022   12:14 PM 10/07/2021   11:54 AM 01/06/2021    1:55 PM 08/27/2020    3:43 PM  [place] in the past year? 0 0 0 0 0  Number falls in past yr: 0  0 0 0  Injury with Fall? 0  0 0 0  Risk for fall due to :   Impaired vision History of fall(s)   Follow up   Falls prevention discussed  Falls prevention discussed    FALL RISK PREVENTION PERTAINING TO THE HOME:  Any stairs in or around the home? Yes  If so, are there any without handrails? Yes  Home free of loose throw rugs in walkways, pet beds, electrical cords, etc? Yes  Adequate lighting in your home to reduce risk of falls? Yes  ASSISTIVE DEVICES UTILIZED TO PREVENT FALLS:  Life alert? No  Use of a cane, walker or w/c? No  Grab bars in the bathroom? Yes  Shower chair or bench in shower? Yes  Elevated toilet seat or a handicapped toilet? Yes   TIMED UP AND GO:  Was the test performed? No .  Length of time to ambulate 10 feet: n/a sec.     Cognitive Function:    11/28/2018    3:08 PM  MMSE - Mini Mental State Exam  Orientation to time  5  Orientation to Place 5  Registration 3  Attention/ Calculation 5  Recall 2  Language- name 2 objects 2  Language- repeat 1  Language- follow 3 step command 3  Language- read & follow direction 1  Write a sentence 1  Copy design 1  Total score 29        10/07/2021   11:55 AM  6CIT Screen  What Year? 0 points  What month? 0 points  What time? 0 points  Count back from 20 0 points  Months in reverse 0 points  Repeat phrase 0 points  Total Score 0 points    Immunizations Immunization History  Administered Date(s) Administered   Influenza Split 08/26/2011, 08/28/2012   Influenza, High Dose Seasonal PF 07/18/2018, 09/03/2019   Influenza-Unspecified 08/24/2014, 09/01/2015, 07/25/2016, 08/22/2017, 09/03/2019, 09/09/2020, 08/19/2021   PFIZER(Purple Top)SARS-COV-2 Vaccination 12/17/2019, 01/09/2020, 08/22/2020, 08/12/2021   Pneumococcal Conjugate-13 10/08/2016   Pneumococcal Polysaccharide-23 03/15/2013, 12/15/2017   Td 12/15/2017   Tdap 11/09/2007   Zoster Recombinat (Shingrix) 01/22/2021, 05/28/2021   Zoster, Live 11/06/2011    TDAP status: Up to date  Flu Vaccine status: Up to date  Pneumococcal vaccine status: Up to date  Covid-19 vaccine status: Completed vaccines  Qualifies for Shingles Vaccine? Yes   Zostavax completed No   Shingrix Completed?: Yes  Screening Tests Health Maintenance  Topic Date Due   DEXA SCAN  01/27/2023   Medicare Annual Wellness (AWV)  10/21/2023   MAMMOGRAM  01/28/2024   COLONOSCOPY (Pts 45-79yr Insurance coverage will need to be confirmed)  04/16/2024   DTaP/Tdap/Td (3 - Td or Tdap) 12/16/2027   Pneumonia Vaccine 71 Years old  Completed   INFLUENZA VACCINE  Completed   Hepatitis C Screening  Completed   Zoster Vaccines- Shingrix  Completed   HPV VACCINES  Aged Out   COVID-19 Vaccine  Discontinued    Health Maintenance  There are no preventive care reminders to display for this patient.   Colorectal cancer screening:  Type of screening: Colonoscopy. Completed 04/16/2021. Repeat every 3 years  Mammogram status: Completed 01/27/22. Repeat every year  Bone Density status: Completed 01/26/21. Results reflect: Bone density results: OSTEOPENIA. Repeat every 2 years.  Lung Cancer Screening: (Low Dose CT Chest recommended if Age 71-80years, 30 pack-year currently smoking OR have quit w/in 15years.) does not qualify.   Lung Cancer Screening Referral: n/a  Additional Screening:  Hepatitis C Screening: does qualify; Completed 10/08/2016  Vision Screening: Recommended annual ophthalmology exams for early detection of glaucoma and other disorders of the eye. Is the patient up to date with their annual eye exam?  Yes  Who is the provider or what is the name of the office in which the patient attends annual eye exams? [facility]oIf pt is not established with a provider, would they like to be referred to a provider to establish care? No .   Dental Screening: Recommended annual dental exams for proper oral hygiene  Community Resource Referral / Chronic Care Management: CRR required this visit?  No   CCM required this visit?  No      Plan:     I have personally reviewed and noted the following in the patient's chart:   Medical and social history Use of alcohol, tobacco or illicit drugs  Current medications and supplements including opioid prescriptions. Patient is not currently taking opioid prescriptions. Functional ability and status Nutritional status Physical activity Advanced directives List of other physicians Hospitalizations, surgeries, and ER visits in previous 12 months Vitals Screenings to include cognitive, depression, and falls Referrals and appointments  In addition, I have reviewed and discussed with patient certain preventive protocols, quality metrics, and best practice recommendations. A written personalized care plan for preventive services as well as general preventive health  recommendations were provided to patient.     Beatrix Fetters, [place]   10/20/2022   Nurse Notes: Non-Face to Face or Face to Face 10 minute visit Encounter   Jenny Hicks , Thank you for taking time to come for your Medicare Wellness Visit. I appreciate your ongoing commitment to your health goals. Please review the following plan we discussed and let me know if I can assist you in the future.   These are the goals we discussed:  Goals      Patient Stated     Stay active      Quality of Life Maintained     Walk more     Weight (lb) < 155 lb (70.3 kg)     Lose weight by increasing activity and decreasing snacking.         This is a list of the screening recommended for you and due dates:  Health Maintenance  Topic Date Due   DEXA scan (bone density measurement)  01/27/2023   Medicare Annual Wellness Visit  10/21/2023   Mammogram  01/28/2024   Colon Cancer Screening  04/16/2024   DTaP/Tdap/Td vaccine (3 - Td or Tdap) 12/16/2027   Pneumonia Vaccine  Completed   Flu Shot  Completed   Hepatitis C Screening: USPSTF Recommendation to screen - Ages 18-79 yo.  Completed   Zoster (Shingles) Vaccine  Completed   HPV Vaccine  Aged Out   COVID-19 Vaccine  Discontinued

## 2022-10-20 NOTE — Patient Instructions (Signed)

## 2022-10-22 ENCOUNTER — Ambulatory Visit: Payer: PPO | Admitting: Family Medicine

## 2022-10-25 ENCOUNTER — Encounter: Payer: Self-pay | Admitting: Family Medicine

## 2022-10-25 ENCOUNTER — Ambulatory Visit (INDEPENDENT_AMBULATORY_CARE_PROVIDER_SITE_OTHER): Payer: PPO | Admitting: Family Medicine

## 2022-10-25 VITALS — BP 138/72 | HR 74 | Temp 98.4°F | Ht 63.0 in | Wt 161.0 lb

## 2022-10-25 DIAGNOSIS — R109 Unspecified abdominal pain: Secondary | ICD-10-CM

## 2022-10-25 DIAGNOSIS — K449 Diaphragmatic hernia without obstruction or gangrene: Secondary | ICD-10-CM | POA: Diagnosis not present

## 2022-10-25 DIAGNOSIS — K219 Gastro-esophageal reflux disease without esophagitis: Secondary | ICD-10-CM | POA: Diagnosis not present

## 2022-10-25 HISTORY — DX: Unspecified abdominal pain: R10.9

## 2022-10-25 LAB — COMPREHENSIVE METABOLIC PANEL
ALT: 15 U/L (ref 0–35)
AST: 16 U/L (ref 0–37)
Albumin: 4.1 g/dL (ref 3.5–5.2)
Alkaline Phosphatase: 54 U/L (ref 39–117)
BUN: 17 mg/dL (ref 6–23)
CO2: 31 mEq/L (ref 19–32)
Calcium: 9.2 mg/dL (ref 8.4–10.5)
Chloride: 104 mEq/L (ref 96–112)
Creatinine, Ser: 0.71 mg/dL (ref 0.40–1.20)
GFR: 85.53 mL/min (ref >60.00)
Glucose, Bld: 120 mg/dL — ABNORMAL HIGH (ref 70–99)
Potassium: 3.7 mEq/L (ref 3.5–5.1)
Sodium: 141 mEq/L (ref 135–145)
Total Bilirubin: 0.5 mg/dL (ref 0.2–1.2)
Total Protein: 6.5 g/dL (ref 6.0–8.3)

## 2022-10-25 LAB — CBC WITH DIFFERENTIAL/PLATELET
Basophils Absolute: 0 10*3/uL (ref 0.0–0.1)
Basophils Relative: 0.3 % (ref 0.0–3.0)
Eosinophils Absolute: 0.1 10*3/uL (ref 0.0–0.7)
Eosinophils Relative: 1.3 % (ref 0.0–5.0)
HCT: 42.1 % (ref 36.0–46.0)
Hemoglobin: 13.9 g/dL (ref 12.0–15.0)
Lymphocytes Relative: 32.6 % (ref 12.0–46.0)
Lymphs Abs: 2 10*3/uL (ref 0.7–4.0)
MCHC: 33.1 g/dL (ref 30.0–36.0)
MCV: 93.3 fl (ref 78.0–100.0)
Monocytes Absolute: 0.4 10*3/uL (ref 0.1–1.0)
Monocytes Relative: 6.7 % (ref 3.0–12.0)
Neutro Abs: 3.6 10*3/uL (ref 1.4–7.7)
Neutrophils Relative %: 59.1 % (ref 43.0–77.0)
Platelets: 272 10*3/uL (ref 150.0–400.0)
RBC: 4.51 Mil/uL (ref 3.87–5.11)
RDW: 12.9 % (ref 11.5–15.5)
WBC: 6.1 10*3/uL (ref 4.0–10.5)

## 2022-10-25 LAB — LIPASE: Lipase: 21 U/L (ref 11.0–59.0)

## 2022-10-25 LAB — H. PYLORI ANTIBODY, IGG: H Pylori IgG: NEGATIVE

## 2022-10-25 LAB — C-REACTIVE PROTEIN: CRP: <1 mg/dL (ref 0.5–20.0)

## 2022-10-25 MED ORDER — SUCRALFATE 1 G PO TABS: 1.0000 g | ORAL_TABLET | Freq: Three times a day (TID) | ORAL | 1 refills | Status: DC

## 2022-10-25 MED ORDER — OMEPRAZOLE 40 MG PO CPDR: 40.0000 mg | DELAYED_RELEASE_CAPSULE | Freq: Every day | ORAL | 3 refills | Status: DC

## 2022-10-25 NOTE — Progress Notes (Signed)
Jenny Hicks , 1951-10-25, 71 y.o., female MRN: [id number] Patient Care Team    Relationship Specialty Notifications Start End  Ma Hillock, DO PCP - General Family Medicine  09/16/16   Pixie Casino, MD Consulting Physician Cardiology  09/16/16   Vania Rea, MD Consulting Physician Obstetrics and Gynecology  09/16/16   Newt Minion, MD Consulting Physician Orthopedic Surgery  11/28/18   Marti Sleigh, MD Referring Physician Gynecology  01/06/21   Mauri Pole, MD Consulting Physician Gastroenterology  10/25/22     Chief Complaint  Patient presents with   Abdominal Pain    Pt c/o epigastric pain, bloating, SOB, with some yellowish stool, loss of appetite, palpitations, heartburn, x 1 mo     Subjective: Pt presents for an OV with complaints of epigastric pain, bloating after meals, loss of appetite for about a month.  She has a history of GERD, colitis and diverticulosis in the past.  She is currently not treating reflux symptoms.  She admits to heartburn.  She states she has some yellowish stools at times.  When her stomach feels bloated it is difficult to take a deep breath without discomfort in the epigastric area.  He denies fever, night sweats or unintentional weight loss.  Her weight is stable today.  She had an EGD in the past which showed benign gastric polyps.  She is established with gastroenterology Dr. Silverio Decamp.      10/20/2022   10:53 AM 10/07/2021   11:52 AM 01/06/2021    1:55 PM 08/27/2020    3:44 PM 01/01/2020    8:29 AM  Depression screen PHQ 2/9  Decreased Interest 0 0 0 0 0  Down, Depressed, Hopeless 0 0 0 0 0  PHQ - 2 Score 0 0 0 0 0    Allergies  Allergen Reactions   Hydrocodone-Acetaminophen Other (See Comments)    Made her BP go way up   Pantoprazole Sodium Palpitations   Social History   Social History Narrative   Married to [place], they have 5 children.    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   Drinks caffeine, takes a daily vitamin   Wears seatbelt, smoke detector in the home, no firearms in the home.    Feels safe in her relationships.    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 03/2013 Echo: EF 55-60%, mild MVP, mild to mod MR.   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  Recommended continued compression for symptomatic improvement.  She can continue Advil as needed for pain.  Recommend no lifting more than 5 pounds for another 3 weeks and then gradual increase to tolerance.  She can move the wrist and the elbow to tolerance   Scoliosis    SVT (supraventricular  tachycardia)    a. 03/2013 occurred during ex MV-->resolved with adenosine and carotid massage.   Past Surgical History:  Procedure Laterality Date   COLONOSCOPY  01/12/2011   DIAGNOSTIC MAMMOGRAM  08/06/2014   digital   TRANSTHORACIC ECHOCARDIOGRAM  03/2013   EF 55-60%, mild conc hypertrophy; mild MVP, mild-mod MR   UMBILICAL HERNIA REPAIR  03/02/2021   with colprpex   VAGINAL HYSTERECTOMY  1989   partial; pt has both ovaries   Family History  Problem Relation Age of Onset   Heart attack Mother    Hypertension Mother        Alzheimer's   Stroke Mother        Afib   Breast cancer Mother    Osteoporosis Mother        cervical/femur fracture   Kidney disease Mother    Myasthenia gravis Father    Prostate cancer Father    Prostate cancer Brother    Arthritis  Maternal Aunt    Arthritis Maternal Uncle    Prostate cancer Maternal Uncle    Early death Maternal Grandmother        died after 2022-12-21 baby   Heart disease Maternal Grandfather    Congestive Heart Failure Maternal Grandfather    Ovarian cancer Paternal Grandmother    Other Daughter        mitral valve damaged had to be repaired, cause unknown   Breast cancer Cousin    Colon cancer Neg Hx    Esophageal cancer Neg Hx    Rectal cancer Neg Hx    Colon polyps Neg Hx    Stomach cancer Neg Hx    Allergies as of 10/25/2022       Reactions   Hydrocodone-acetaminophen Other (See Comments)   Made her BP go way up   Pantoprazole Sodium Palpitations        Medication List        Accurate as of October 25, 2022 12:25 PM. If you have any questions, ask your nurse or doctor.          CALCIUM 600 + D PO Take 600 mg by mouth daily. Vit D 800 units   CENTRUM PO Take by mouth daily.   metoprolol tartrate 25 MG tablet Commonly known as: LOPRESSOR Take 12.5 mg by mouth 2 (two) times daily. 1/2 tablet daily (12.43m BID)   omeprazole 40 MG capsule Commonly known as: PRILOSEC Take 1 capsule (40 mg total) by mouth daily. Started by: RHoward Pouch DO   sucralfate 1 g tablet Commonly known as: Carafate Take 1 tablet (1 g total) by mouth 4 (four) times daily -  with meals and at bedtime. Started by: RHoward Pouch DO   Vitamin D (Cholecalciferol) 25 MCG (1000 UT) Tabs Take by mouth. 2,000 units 3x a week        All past medical history, surgical history, allergies, family history, immunizations andmedications were updated in the EMR today and reviewed under the history and medication portions of their EMR.     ROS Negative, with the exception of above mentioned in HPI   Objective:  BP 138/72   Pulse 74   Temp 98.4 F (36.9 C) (Oral)   Ht _0  (1.6 m)   Wt 161 lb (73 kg)   SpO2 97%   BMI 28.52 kg/m  Body mass index is 28.52 kg/m. Physical Exam Vitals and nursing  note reviewed.  Constitutional:      General: She is not in acute distress.  Appearance: Normal appearance. She is normal weight. She is not ill-appearing or toxic-appearing.  HENT:     Head: Normocephalic and atraumatic.  Eyes:     Extraocular Movements: Extraocular movements intact.     Conjunctiva/sclera: Conjunctivae normal.     Pupils: Pupils are equal, round, and reactive to light.  Cardiovascular:     Rate and Rhythm: Normal rate and regular rhythm.  Pulmonary:     Effort: Pulmonary effort is normal. No respiratory distress.     Breath sounds: Normal breath sounds. No wheezing, rhonchi or rales.  Abdominal:     General: Abdomen is flat. Bowel sounds are normal. There is no distension.     Palpations: Abdomen is soft. There is no mass.     Tenderness: There is abdominal tenderness in the epigastric area. There is no guarding or rebound.  Skin:    Findings: No rash.  Neurological:     Mental Status: She is alert and oriented to person, place, and time. Mental status is at baseline.  Psychiatric:        Mood and Affect: Mood normal.        Behavior: Behavior normal.        Thought Content: Thought content normal.        Judgment: Judgment normal.      No results found. No results found. No results found for this or any previous visit (from the past 24 hour(s)).  Assessment/Plan: KEEGHAN MCINTIRE is a 71 y.o. female present for OV for  Abdominal pain, unspecified abdominal location/Hiatal hernia with gastroesophageal reflux Patient has a history of small hiatal hernia 2018.  Her symptoms seem consistent with hiatal hernia worsening with reflux symptoms, although cannot be certain without imaging studies. For now we will start workup for gastritis/colitis and treat appropriately if present. Reassuring weight is stable. Start omeprazole 40 mg daily. Start Carafate 1 tab p.o. about 30 minutes before meal. Considering imaging study or gastroenterology referral depending  upon laboratory results and clinical outcome. - CBC w/Diff - Comp Met (CMET) - C-reactive protein - Lipase - H. pylori antibody, IgG   Reviewed expectations re: course of current medical issues. Discussed self-management of symptoms. Outlined signs and symptoms indicating need for more acute intervention. Patient verbalized understanding and all questions were answered. Patient received an After-Visit Summary.    Orders Placed This Encounter  Procedures   CBC w/Diff   Comp Met (CMET)   C-reactive protein   Lipase   H. pylori antibody, IgG   Meds ordered this encounter  Medications   omeprazole (PRILOSEC) 40 MG capsule    Sig: Take 1 capsule (40 mg total) by mouth daily.    Dispense:  30 capsule    Refill:  3   sucralfate (CARAFATE) 1 g tablet    Sig: Take 1 tablet (1 g total) by mouth 4 (four) times daily -  with meals and at bedtime.    Dispense:  120 tablet    Refill:  1   Referral Orders  No referral(s) requested today     Note is dictated utilizing voice recognition software. Although note has been proof read prior to signing, occasional typographical errors still can be missed. If any questions arise, please do not hesitate to call for verification.   electronically signed by:  Howard Pouch, DO  [place]

## 2022-10-25 NOTE — Patient Instructions (Addendum)
Hiatal Hernia  A hiatal hernia occurs when part of the stomach slides above the muscle that separates the abdomen from the chest (diaphragm). A person can be born with a hiatal hernia (congenital), or it may develop over time. In almost all cases of hiatal hernia, only the top part of the stomach pushes through the diaphragm. Many people have a hiatal hernia with no symptoms. The larger the hernia, the more likely it is that you will have symptoms. In some cases, a hiatal hernia allows stomach acid to flow back into the tube that carries food from your mouth to your stomach (esophagus). This may cause heartburn symptoms. The development of heartburn symptoms may mean that you have a condition called gastroesophageal reflux disease (GERD). What are the causes? This condition is caused by a weakness in the opening (hiatus) where the esophagus passes through the diaphragm to attach to the upper part of the stomach. A person may be born with a weakness in the hiatus, or a weakness can develop over time. What increases the risk? This condition is more likely to develop in: Older people. Age is a major risk factor for a hiatal hernia, especially if you are over the age of 33. Pregnant women. People who are overweight. People who have frequent constipation. What are the signs or symptoms? Symptoms of this condition usually develop in the form of GERD symptoms. Symptoms include: Heartburn. Upset stomach (indigestion). Trouble swallowing. Coughing or wheezing. Wheezing is making high-pitched whistling sounds when you breathe. Sore throat. Chest pain. Nausea and vomiting. How is this diagnosed? This condition may be diagnosed during testing for GERD. Tests that may be done include: X-rays of your stomach or chest. An upper gastrointestinal (GI) series. This is an X-ray exam of your GI tract that is taken after you swallow a chalky liquid that shows up clearly on the X-ray. Endoscopy. This is a  procedure to look into your stomach using a thin, flexible tube that has a tiny camera and light on the end of it. How is this treated? This condition may be treated by: Dietary and lifestyle changes to help reduce GERD symptoms. Medicines. These may include: Over-the-counter antacids. Medicines that make your stomach empty more quickly. Medicines that block the production of stomach acid (H2 blockers). Stronger medicines to reduce stomach acid (proton pump inhibitors). Surgery to repair the hernia, if other treatments are not helping. If you have no symptoms, you may not need treatment. Follow these instructions at home: Lifestyle and activity Do not use any products that contain nicotine or tobacco. These products include cigarettes, chewing tobacco, and vaping devices, such as e-cigarettes. If you need help quitting, ask your health care provider. Try to achieve and maintain a healthy body weight. Avoid putting pressure on your abdomen. Anything that puts pressure on your abdomen increases the amount of acid that may be pushed up into your esophagus. Avoid bending over, especially after eating. Raise the head of your bed by putting blocks under the legs. This keeps your head and esophagus higher than your stomach. Do not wear tight clothing around your chest or stomach. Try not to strain when having a bowel movement, when urinating, or when lifting heavy objects. Eating and drinking Avoid foods that can worsen GERD symptoms. These may include: Fatty foods, like fried foods. Citrus fruits, like oranges or lemon. Other foods and drinks that contain acid, like orange juice or tomatoes. Spicy food. Chocolate. Eat frequent small meals instead of three large meals a  day. This helps prevent your stomach from getting too full. Eat slowly. Do not lie down right after eating. Do not eat 1-2 hours before bed. Do not drink beverages with caffeine. These include cola, coffee, cocoa, and tea. Do  not drink alcohol. General instructions Take over-the-counter and prescription medicines only as told by your health care provider. Keep all follow-up visits. Your health care provider will want to check that any new prescribed medicines are helping your symptoms. Contact a health care provider if: Your symptoms are not controlled with medicines or lifestyle changes. You are having trouble swallowing. You have coughing or wheezing that will not go away. Your pain is getting worse. Your pain spreads to your arms, neck, jaw, teeth, or back. You feel nauseous or you vomit. Get help right away if: You have shortness of breath. You vomit blood. You have bright red blood in your stools. You have black, tarry stools. These symptoms may be an emergency. Get help right away. Call 911. Do not wait to see if the symptoms will go away. Do not drive yourself to the hospital. Summary A hiatal hernia occurs when part of the stomach slides above the muscle that separates the abdomen from the chest. A person may be born with a weakness in the hiatus, or a weakness can develop over time. Symptoms of a hiatal hernia may include heartburn, trouble swallowing, or sore throat. Management of a hiatal hernia includes eating frequent small meals instead of three large meals a day. Get help right away if you vomit blood, have bright red blood in your stools, or have black, tarry stools. This information is not intended to replace advice given to you by your health care provider. Make sure you discuss any questions you have with your health care provider. Document Revised: 12/22/2021 Document Reviewed: 12/22/2021 Elsevier Patient Education  [employer]. Return in about 2 weeks (around 11/08/2022), or if symptoms worsen or fail to improve.        Great to see you today.    If labs were collected, we will inform you of lab results once received either by echart message or telephone call.   - echart  message- for normal results that have been seen by the patient already.   - telephone call: abnormal results or if patient has not viewed results in their echart.

## 2022-10-26 ENCOUNTER — Telehealth: Payer: Self-pay | Admitting: Family Medicine

## 2022-10-26 NOTE — Telephone Encounter (Signed)
LVM for pt to CB regarding results.  

## 2022-10-26 NOTE — Telephone Encounter (Signed)
Please call patient: Her liver and kidney function are normal. Her electrolytes and blood cell counts are normal. Her inflammatory marker and pancreatic enzyme levels are normal. I recommend she take the medication as prescribed, and follow-up with this provider in  4 weeks for reevaluation.  If her symptoms are worsening over this time, follow-up sooner so that we can move forward with further studies and referrals.

## 2022-10-26 NOTE — Telephone Encounter (Signed)
Pt is requesting that her medication Sucralfate be called into [place]. [facility]. She mentioned that she has had it sent there before.She is requesting this because [facility] was out of it and need ed to order it, but she is heading out of town to her daughters in [place]. Please contact patient for clarity on which pharmacy.

## 2022-10-26 NOTE — Telephone Encounter (Signed)
Spoke with pt regarding labs and instructions.   

## 2022-10-26 NOTE — Telephone Encounter (Signed)
Pt informed how to transfer rx.

## 2022-10-28 DIAGNOSIS — K219 Gastro-esophageal reflux disease without esophagitis: Secondary | ICD-10-CM | POA: Diagnosis not present

## 2022-10-28 DIAGNOSIS — R109 Unspecified abdominal pain: Secondary | ICD-10-CM | POA: Diagnosis not present

## 2022-10-28 DIAGNOSIS — K769 Liver disease, unspecified: Secondary | ICD-10-CM | POA: Diagnosis not present

## 2022-10-28 DIAGNOSIS — R1011 Right upper quadrant pain: Secondary | ICD-10-CM | POA: Diagnosis not present

## 2022-10-28 DIAGNOSIS — R110 Nausea: Secondary | ICD-10-CM | POA: Diagnosis not present

## 2022-10-28 DIAGNOSIS — R911 Solitary pulmonary nodule: Secondary | ICD-10-CM | POA: Diagnosis not present

## 2022-10-28 DIAGNOSIS — N3289 Other specified disorders of bladder: Secondary | ICD-10-CM | POA: Diagnosis not present

## 2022-10-28 DIAGNOSIS — Z0000 Encounter for general adult medical examination without abnormal findings: Secondary | ICD-10-CM | POA: Diagnosis not present

## 2022-10-28 DIAGNOSIS — N200 Calculus of kidney: Secondary | ICD-10-CM | POA: Diagnosis not present

## 2022-10-28 DIAGNOSIS — Z1152 Encounter for screening for COVID-19: Secondary | ICD-10-CM | POA: Diagnosis not present

## 2022-10-28 DIAGNOSIS — R1013 Epigastric pain: Secondary | ICD-10-CM | POA: Diagnosis not present

## 2022-10-28 DIAGNOSIS — N281 Cyst of kidney, acquired: Secondary | ICD-10-CM | POA: Diagnosis not present

## 2022-11-04 ENCOUNTER — Other Ambulatory Visit: Payer: Self-pay | Admitting: Internal Medicine

## 2022-11-04 ENCOUNTER — Telehealth: Payer: Self-pay | Admitting: Family Medicine

## 2022-11-04 NOTE — Telephone Encounter (Signed)
Please advise 

## 2022-11-04 NOTE — Telephone Encounter (Signed)
Please inform patient that Prilosec is omeprazole, this is the same medication.

## 2022-11-04 NOTE — Telephone Encounter (Signed)
Pt called and reports that the 40 mg  Omeprazole is what she believes she had a reaction to, however she bought the over the counter prilosec 20 mg and reports that works good for her and is requesting that be called into the pharmacy([facility] in [place])

## 2022-11-05 NOTE — Telephone Encounter (Signed)
Pt states she has been experience chills, nausea, loss of appetite, HA, and abd discomfort. Pt states sx relieve after switching to OTC strength. Pt will CB pt sched appt with provider when back in town. Sx resolved for now.   Note: Neg flu per pt

## 2022-11-26 ENCOUNTER — Other Ambulatory Visit: Payer: Self-pay

## 2022-11-26 DIAGNOSIS — R42 Dizziness and giddiness: Secondary | ICD-10-CM | POA: Diagnosis not present

## 2022-11-26 DIAGNOSIS — R2689 Other abnormalities of gait and mobility: Secondary | ICD-10-CM | POA: Diagnosis not present

## 2022-11-26 DIAGNOSIS — R531 Weakness: Secondary | ICD-10-CM | POA: Diagnosis not present

## 2022-11-26 DIAGNOSIS — N390 Urinary tract infection, site not specified: Secondary | ICD-10-CM | POA: Diagnosis not present

## 2022-11-26 DIAGNOSIS — R002 Palpitations: Secondary | ICD-10-CM | POA: Diagnosis not present

## 2022-11-26 DIAGNOSIS — R269 Unspecified abnormalities of gait and mobility: Secondary | ICD-10-CM | POA: Diagnosis not present

## 2022-12-08 ENCOUNTER — Encounter: Payer: Self-pay | Admitting: Family Medicine

## 2022-12-08 ENCOUNTER — Telehealth: Payer: Self-pay

## 2022-12-08 ENCOUNTER — Ambulatory Visit (INDEPENDENT_AMBULATORY_CARE_PROVIDER_SITE_OTHER): Payer: PPO | Admitting: Family Medicine

## 2022-12-08 VITALS — BP 136/76 | HR 82 | Temp 98.3°F

## 2022-12-08 DIAGNOSIS — R6883 Chills (without fever): Secondary | ICD-10-CM | POA: Diagnosis not present

## 2022-12-08 LAB — POC URINALSYSI DIPSTICK (AUTOMATED)
Bilirubin, UA: NEGATIVE
Glucose, UA: NEGATIVE
Ketones, UA: NEGATIVE
Leukocytes, UA: NEGATIVE
Nitrite, UA: NEGATIVE
Protein, UA: NEGATIVE
Spec Grav, UA: 1.01 (ref 1.010–1.025)
Urobilinogen, UA: 2 E.U./dL — AB
pH, UA: 6.5 (ref 5.0–8.0)

## 2022-12-08 MED ORDER — CEFDINIR 300 MG PO CAPS: 300.0000 mg | ORAL_CAPSULE | Freq: Two times a day (BID) | ORAL | 0 refills | Status: AC

## 2022-12-08 MED ORDER — OMEPRAZOLE 20 MG PO CPDR: 20.0000 mg | DELAYED_RELEASE_CAPSULE | Freq: Every day | ORAL | 1 refills | Status: DC

## 2022-12-08 NOTE — Patient Instructions (Addendum)
Return if symptoms worsen or fail to improve.        Great to see you today.  I have refilled the medication(s) we provide.   If labs were collected, we will inform you of lab results once received either by echart message or telephone call.   - echart message- for normal results that have been seen by the patient already.   - telephone call: abnormal results or if patient has not viewed results in their echart.  

## 2022-12-08 NOTE — Progress Notes (Signed)
Jenny Hicks , 08-24-1951, 72 y.o., female MRN: [id number] Patient Care Team    Relationship Specialty Notifications Start End  Ma Hillock, DO PCP - General Family Medicine  09/16/16   Pixie Casino, MD Consulting Physician Cardiology  09/16/16   Vania Rea, MD Consulting Physician Obstetrics and Gynecology  09/16/16   Newt Minion, MD Consulting Physician Orthopedic Surgery  11/28/18   Marti Sleigh, MD Referring Physician Gynecology  01/06/21   Mauri Pole, MD Consulting Physician Gastroenterology  10/25/22     Chief Complaint  Patient presents with   Urinary Tract Infection    Chills, off balance     Subjective: Pt presents for an OV with complaints of chills and off balanced feeling of 3 weeks duration.  Associated symptoms include memory changes. She reports the last time she had a UTI these were her symptoms. She was seen in ED 1/19 for these same complaints and was treated for UTI w/ keflex BID x5 days. Her symptoms have remained.   Reviewed records from ED visit.       10/20/2022   10:53 AM 10/07/2021   11:52 AM 01/06/2021    1:55 PM 08/27/2020    3:44 PM 01/01/2020    8:29 AM  Depression screen PHQ 2/9  Decreased Interest 0 0 0 0 0  Down, Depressed, Hopeless 0 0 0 0 0  PHQ - 2 Score 0 0 0 0 0    Allergies  Allergen Reactions   Hydrocodone-Acetaminophen Other (See Comments)    Made her BP go way up   Pantoprazole Sodium Palpitations   Social History   Social History Narrative   Married to [place], they have 5 children.    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   Drinks caffeine, takes a daily vitamin   Wears seatbelt, smoke detector in the home, no firearms in the home.    Feels safe in her relationships.    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 03/2013 Echo: EF 55-60%, mild MVP, mild to mod MR.   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  Recommended continued compression for symptomatic improvement.  She can continue Advil as needed for pain.  Recommend no lifting more than 5 pounds for another 3 weeks and then gradual increase to tolerance.  She can move the wrist and the elbow to tolerance   Scoliosis    SVT (supraventricular tachycardia)    a. 03/2013 occurred during ex MV-->resolved with adenosine and carotid massage.   Past Surgical History:  Procedure Laterality Date   COLONOSCOPY  01/12/2011   DIAGNOSTIC MAMMOGRAM  08/06/2014   digital   TRANSTHORACIC ECHOCARDIOGRAM  03/2013   EF 55-60%, mild conc hypertrophy; mild MVP, mild-mod MR   UMBILICAL HERNIA  REPAIR  03/02/2021   with colprpex   VAGINAL HYSTERECTOMY  1989   partial; pt has both ovaries   Family History  Problem Relation Age of Onset   Heart attack Mother    Hypertension Mother        Alzheimer's   Stroke Mother        Afib   Breast cancer Mother    Osteoporosis Mother        cervical/femur fracture   Kidney disease Mother    Myasthenia gravis Father    Prostate cancer Father    Prostate cancer Brother    Arthritis Maternal Aunt    Arthritis Maternal Uncle    Prostate cancer Maternal Uncle    Early death Maternal Grandmother        died after 31-Jan-2023 baby   Heart disease Maternal Grandfather    Congestive Heart Failure Maternal Grandfather    Ovarian cancer Paternal Grandmother    Other Daughter        mitral valve damaged had to be repaired, cause unknown   Breast  cancer Cousin    Colon cancer Neg Hx    Esophageal cancer Neg Hx    Rectal cancer Neg Hx    Colon polyps Neg Hx    Stomach cancer Neg Hx    Allergies as of 12/08/2022       Reactions   Hydrocodone-acetaminophen Other (See Comments)   Made her BP go way up   Pantoprazole Sodium Palpitations        Medication List        Accurate as of December 08, 2022  3:37 PM. If you have any questions, ask your nurse or doctor.          STOP taking these medications    metoprolol tartrate 25 MG tablet Commonly known as: LOPRESSOR Stopped by: Howard Pouch, DO       TAKE these medications    CALCIUM 600 + D PO Take 600 mg by mouth daily. Vit D 800 units   cefdinir 300 MG capsule Commonly known as: OMNICEF Take 1 capsule (300 mg total) by mouth 2 (two) times daily for 14 days. Started by: Howard Pouch, DO   CENTRUM PO Take by mouth daily.   metoprolol succinate 25 MG 24 hr tablet Commonly known as: TOPROL-XL Take 1 tablet (25 mg total) by mouth daily. Schedule office visit for future refills   omeprazole 20 MG capsule Commonly known as: PRILOSEC Take 1 capsule (20 mg total) by mouth daily. What changed:  medication strength how much to take Changed by: Howard Pouch, DO   sucralfate 1 g tablet Commonly known as: Carafate Take 1 tablet (1 g total) by mouth 4 (four) times daily -  with meals and at bedtime.   Vitamin D (Cholecalciferol) 25 MCG (1000 UT) Tabs Take by mouth. 2,000 units 3x a week        All past medical history, surgical history, allergies, family history, immunizations andmedications were updated in the EMR today and reviewed under the history and medication portions of their EMR.     Review of Systems  Constitutional:  Negative for chills, fever and malaise/fatigue.  Gastrointestinal:  Negative for nausea and vomiting.  Musculoskeletal:  Negative for myalgias.  Skin:  Negative for rash.  Neurological:  Positive for dizziness. Negative for  headaches.   Negative, with the exception of above mentioned in HPI   Objective:  BP 136/76   Pulse 82   Temp 98.3 F (  36.8 C)   SpO2 97%  There is no height or weight on file to calculate BMI.  Physical Exam Vitals and nursing note reviewed.  Constitutional:      General: She is not in acute distress.    Appearance: Normal appearance. She is normal weight. She is not ill-appearing or toxic-appearing.  HENT:     Head: Normocephalic and atraumatic.  Eyes:     General: No scleral icterus.       Right eye: No discharge.        Left eye: No discharge.     Extraocular Movements: Extraocular movements intact.     Conjunctiva/sclera: Conjunctivae normal.     Pupils: Pupils are equal, round, and reactive to light.  Abdominal:     General: Abdomen is flat.     Palpations: Abdomen is soft.     Tenderness: There is no abdominal tenderness. There is no right CVA tenderness, left CVA tenderness, guarding or rebound.  Skin:    Findings: No rash.  Neurological:     Mental Status: She is alert and oriented to person, place, and time. Mental status is at baseline.     Motor: No weakness.     Coordination: Coordination normal.     Gait: Gait normal.  Psychiatric:        Mood and Affect: Mood normal.        Behavior: Behavior normal.        Thought Content: Thought content normal.        Judgment: Judgment normal.      No results found. No results found. Results for orders placed or performed in visit on 12/08/22 (from the past 24 hour(s))  POCT Urinalysis Dipstick (Automated)     Status: Abnormal   Collection Time: 12/08/22  3:25 PM  Result Value Ref Range   Color, UA yellow    Clarity, UA clear    Glucose, UA Negative Negative   Bilirubin, UA neg    Ketones, UA neg    Spec Grav, UA 1.010 1.010 - 1.025   Blood, UA +    pH, UA 6.5 5.0 - 8.0   Protein, UA Negative Negative   Urobilinogen, UA 2.0 (A) 0.2 or 1.0 E.U./dL   Nitrite, UA neg    Leukocytes, UA Negative Negative     Assessment/Plan: DOLCE SYLVIA is a 72 y.o. female present for OV for  Chills/UTI Rest. Hydrate. Start omnicef 300 BID x 7 days  POCT +RBC, otherwise appears negative. Will send for culture.  Her work up on 1/19 was reassuring to rule out TIA/Stroke.  She possibly was under treated, but therei cx was contaminated.  F/u PRN  Reviewed expectations re: course of current medical issues. Discussed self-management of symptoms. Outlined signs and symptoms indicating need for more acute intervention. Patient verbalized understanding and all questions were answered. Patient received an After-Visit Summary.    Orders Placed This Encounter  Procedures   Urinalysis w microscopic + reflex cultur   POCT Urinalysis Dipstick (Automated)   Meds ordered this encounter  Medications   cefdinir (OMNICEF) 300 MG capsule    Sig: Take 1 capsule (300 mg total) by mouth 2 (two) times daily for 14 days.    Dispense:  20 capsule    Refill:  0   omeprazole (PRILOSEC) 20 MG capsule    Sig: Take 1 capsule (20 mg total) by mouth daily.    Dispense:  90 capsule    Refill:  1  Referral Orders  No referral(s) requested today     Note is dictated utilizing voice recognition software. Although note has been proof read prior to signing, occasional typographical errors still can be missed. If any questions arise, please do not hesitate to call for verification.   electronically signed by:  Howard Pouch, DO  [place]

## 2022-12-08 NOTE — Telephone Encounter (Signed)
Pt scheduled with PCP  [facility] - Client TELEPHONE ADVICE RECORD [facility] Patient Name: Jenny Hicks Gender: Female DOB: 06/20/1951 Age: 72 Y 94 M 20 D Return Phone Number: [phone] (Primary) Address: City/ State/ Zip: [place]  [address] Client [facility] - Client Client Site [facility] - Day Provider Raoul Pitch, [place] Contact Type Call Who Is Calling Patient / Member / Family / Caregiver Call Type Triage / Clinical Relationship To Patient Self Return Phone Number [phone] (Primary) Chief Complaint Heart palpitations or irregular heartbeat Reason for Call Symptomatic / Request for [place] states she needs an appointment. Caller states she was at the ER and was advised to follow up with her doctor. Caller states she needs her medication changed. Caller states her heart rate has been in the 90s for a couple weeks. Translation No Nurse Assessment Nurse: D'Heur Lucia Gaskins, RN, Adrienne Date/Time (Eastern Time): 12/08/2022 8:01:48 AM Confirm and document reason for call. If symptomatic, describe symptoms. ---Caller states she needs an appointment. Caller states she was at the ER for balance issue/trouble walking and was advised to follow up with her doctor. Caller states she needs her medication (omeprazole 40 mg) changed d/t headache & nausea. Caller states her heart rate has been in the upper 90s for a couple weeks. She does still feel "a little off balance". Does the patient have any new or worsening symptoms? ---Yes Will a triage be completed? ---Yes Related visit to physician within the last 2 weeks? ---No Does the PT have any chronic conditions? (i.e. diabetes, asthma, this includes High risk factors for pregnancy, etc.) ---Yes List chronic conditions. ---SVT's, hiatal hernia Is this a behavioral health or substance abuse call? ---No Guidelines Guideline  Title Affirmed Question Affirmed Notes Nurse Date/Time (Eastern Time) Heart Rate and Heartbeat Questions Age > 60 years (Exception: Brief heartbeat symptoms D'Heur Lucia Gaskins, RN, Vincente Liberty 12/08/2022 8:08:23 AM PLEASE NOTE: All timestamps contained within this report are represented as [place] Standard Time. CONFIDENTIALTY NOTICE: This fax transmission is intended only for the addressee. It contains information that is legally privileged, confidential or otherwise protected from use or disclosure. If you are not the intended recipient, you are strictly prohibited from reviewing, disclosing, copying using or disseminating any of this information or taking any action in reliance on or regarding this information. If you have received this fax in error, please notify us immediately by telephone so that we can arrange for its return to [place]. Phone: [phone], Toll-Free: [phone], Fax: [phone] Page: 2 of 2 Call Id: [id number] Guidelines Guideline Title Affirmed Question Affirmed Notes Nurse Date/Time Eilene Ghazi Time) that went away and now feels well.) Disp. Time Eilene Ghazi Time) Disposition Final User 12/08/2022 8:15:03 AM See HCP within 4 Hours (or PCP triage) Yes D'Heur Lucia Gaskins, RN, Adrienne Final Disposition 12/08/2022 8:15:03 AM See HCP within 4 Hours (or PCP triage) Yes D'Heur Lucia Gaskins, RN, Ebbie Latus Disagree/Comply Comply Caller Understands Yes PreDisposition Call Doctor Care Advice Given Per Guideline SEE HCP (OR PCP TRIAGE) WITHIN 4 HOURS: * IF OFFICE WILL BE OPEN: You need to be seen within the next 3 or 4 hours. Call your doctor (or NP/PA) now or as soon as the office opens. CALL BACK IF: * You become worse CARE ADVICE given per Heart Rate and Heartbeat Questions (Adult) guideline. Comments User: Vincente Liberty, D'Heur Lucia Gaskins, RN Date/Time Eilene Ghazi Time): 12/08/2022 8:09:20 AM Caller states she has had higher pulse for past week and a  half. She has been on a cruise and thinks  it may be d/t excess salt. User: Vincente Liberty, D'Heur Lucia Gaskins, RN Date/Time Eilene Ghazi Time): 12/08/2022 8:17:43 AM Warm transferred to office as per directives. Referrals REFERRED TO PCP OFFICE

## 2022-12-09 LAB — URINALYSIS W MICROSCOPIC + REFLEX CULTURE
Bacteria, UA: NONE SEEN /HPF
Bilirubin Urine: NEGATIVE
Glucose, UA: NEGATIVE
Hgb urine dipstick: NEGATIVE
Hyaline Cast: NONE SEEN /LPF
Ketones, ur: NEGATIVE
Leukocyte Esterase: NEGATIVE
Nitrites, Initial: NEGATIVE
Protein, ur: NEGATIVE
Specific Gravity, Urine: 1.013 (ref 1.001–1.035)
Squamous Epithelial / HPF: NONE SEEN /HPF (ref <=5)
WBC, UA: NONE SEEN /HPF (ref 0–5)
pH: 7 (ref 5.0–8.0)

## 2022-12-09 LAB — NO CULTURE INDICATED

## 2023-01-10 ENCOUNTER — Encounter: Payer: PPO | Admitting: Family Medicine

## 2023-01-28 ENCOUNTER — Encounter: Payer: Self-pay | Admitting: Family Medicine

## 2023-01-28 ENCOUNTER — Ambulatory Visit: Payer: Self-pay

## 2023-01-28 ENCOUNTER — Ambulatory Visit: Payer: PPO | Admitting: Family Medicine

## 2023-01-28 ENCOUNTER — Ambulatory Visit (HOSPITAL_COMMUNITY)
Admission: RE | Admit: 2023-01-28 | Discharge: 2023-01-28 | Disposition: A | Discharge status: Home / Self Care | Payer: PPO | Source: Ambulatory Visit | Attending: Vascular Surgery | Admitting: Vascular Surgery

## 2023-01-28 ENCOUNTER — Ambulatory Visit (INDEPENDENT_AMBULATORY_CARE_PROVIDER_SITE_OTHER): Payer: PPO

## 2023-01-28 VITALS — BP 130/82 | HR 79 | Ht 63.0 in | Wt 158.0 lb

## 2023-01-28 DIAGNOSIS — M7989 Other specified soft tissue disorders: Secondary | ICD-10-CM | POA: Diagnosis not present

## 2023-01-28 DIAGNOSIS — M79671 Pain in right foot: Secondary | ICD-10-CM

## 2023-01-28 MED ORDER — PREDNISONE 50 MG PO TABS: ORAL_TABLET | ORAL | 0 refills | Status: DC

## 2023-01-28 NOTE — Progress Notes (Signed)
I, Josepha Pigg, CMA acting as a scribe for Lynne Leader, MD.  MILOVE PEMBLETON is a 72 y.o. female who presents to [place] at [facility] today for R foot pain. Pt is a retired Therapist, sports. Pt was last seen by Dr. Georgina Snell on 06/08/22 for R heel pain.  Today, pt reports R foot pain x a month or so. Pt locates pain to the top of the right foot, lateral aspect. PF sx have improved. Swelling at lateral aspect of the foot and ankle. Recently back from [place], worse compression socks while traveling.   R foot swelling: Aggravates: WB, ROM Treatments tried: compression with air travel.   Dx imaging: 04/20/22 R Os calcis & foot XR   Pertinent review of systems: no fever or chills  Relevant historical information: Osteopenia.   Exam:  BP 130/82   Pulse 79   Ht 5\' 3"  (1.6 m)   Wt 158 lb (71.7 kg)   SpO2 98%   BMI 27.99 kg/m  General: Well Developed, well nourished, and in no acute distress.   MSK: Right foot and ankle: Mild swelling right calf and lower leg. Significant swelling right lateral dorsal midfoot and lateral ankle. Tender to palpation dorsal lateral midfoot. Decreased ankle motion.   Pain with resisted foot eversion and dorsiflexion. Strength is intact. Pulses capillary fill and sensation are intact.      Lab and Radiology Results  Diagnostic Limited MSK Ultrasound of: Right lateral ankle and midfoot Mild hypoechoic fluid tracking along peroneal tendon.  Tendon is intact Peroneal tertius tendon is intact and normal-appearing. Mild swelling midfoot. Mild degenerative changes midfoot. No acute fractures are visible. Impression: Mild tenosynovitis peroneal tendon.  X-ray images right foot and ankle obtained today personally and independently interpreted  Right ankle: No acute fractures.  Minimal degenerative changes.  Right foot: Degeneration cuboid.  Otherwise normal-appearing No acute fractures are visible.  Await formal radiology  review  Assessment and Plan: 72 y.o. female with right lateral foot and ankle pain.  She is done a lot of walking recently during her trip to [place].  She also has some leg swelling.  There may be a risk for DVT here.  Plan for vascular ultrasound to assess for DVT.  However tenosynovitis or exacerbation of midfoot degenerative changes of the more likely possibility.  Plan for CAM Walker boot home exercise program and physical therapy.  Short course of prednisone prescribed for use if needed if worsening.  She is going to live part-time in [place] so use a PT location there. Recheck in 6 weeks.  PDMP not reviewed this encounter. Orders Placed This Encounter  Procedures   [place] LIMITED JOINT SPACE STRUCTURES LOW RIGHT(NO LINKED CHARGES)    Order Specific Question:   Reason for Exam (SYMPTOM  OR DIAGNOSIS REQUIRED)    Answer:   right foot pain    Order Specific Question:   Preferred imaging location?    Answer:   [place]   DG Foot Complete Right    Standing Status:   Future    Number of Occurrences:   1    Standing Expiration Date:   01/28/2024    Order Specific Question:   Reason for Exam (SYMPTOM  OR DIAGNOSIS REQUIRED)    Answer:   right foot pain    Order Specific Question:   Preferred imaging location?    Answer:   [facility]   DG Ankle Complete Right    Standing Status:  Future    Number of Occurrences:   1    Standing Expiration Date:   01/28/2024    Order Specific Question:   Reason for Exam (SYMPTOM  OR DIAGNOSIS REQUIRED)    Answer:   right foot pain    Order Specific Question:   Preferred imaging location?    Answer:   [facility]   Ambulatory referral to Physical Therapy    Referral Priority:   Routine    Referral Type:   Physical Medicine    Referral Reason:   Specialty Services Required    Requested Specialty:   Physical Therapy    Number of Visits Requested:   1   Meds ordered this encounter  Medications    predniSONE (DELTASONE) 50 MG tablet    Sig: Take 1 pill daily for 5 days    Dispense:  5 tablet    Refill:  0     Discussed warning signs or symptoms. Please see discharge instructions. Patient expresses understanding.   Total encounter time 30 minutes including face-to-face time with the patient and, reviewing past medical record, and charting on the date of service.   Time-based billing does not include time to perform the ultrasound. The above documentation has been reviewed and is accurate and complete Lynne Leader, M.D.

## 2023-01-28 NOTE — Patient Instructions (Addendum)
Thank you for coming in today.   Please complete the exercises that the athletic trainer went over with you:  View at [email or web address] using code: [id number]  I've referred you to Physical Therapy.  Let us know if you don't hear from them in one week.   Please get an Xray today before you leave   I've sent a prescription for Prednisone to your pharmacy.   You have a Vascular Ultrasound scheduled for: today at 3:30  [facility] at [place], [place] [address] Phone: [phone]   Check back in 6 weeks

## 2023-01-31 ENCOUNTER — Telehealth: Payer: Self-pay | Admitting: Family Medicine

## 2023-01-31 ENCOUNTER — Ambulatory Visit
Admission: RE | Admit: 2023-01-31 | Discharge: 2023-01-31 | Disposition: A | Discharge status: Home / Self Care | Payer: PPO | Source: Ambulatory Visit | Attending: Family Medicine | Admitting: Family Medicine

## 2023-01-31 ENCOUNTER — Encounter: Payer: Self-pay | Admitting: Family Medicine

## 2023-01-31 ENCOUNTER — Ambulatory Visit (INDEPENDENT_AMBULATORY_CARE_PROVIDER_SITE_OTHER): Payer: PPO | Admitting: Family Medicine

## 2023-01-31 ENCOUNTER — Ambulatory Visit: Payer: PPO | Admitting: Sports Medicine

## 2023-01-31 VITALS — BP 125/73 | HR 63 | Temp 98.1°F | Ht 63.78 in | Wt 160.4 lb

## 2023-01-31 DIAGNOSIS — Z1231 Encounter for screening mammogram for malignant neoplasm of breast: Secondary | ICD-10-CM

## 2023-01-31 DIAGNOSIS — Z13820 Encounter for screening for osteoporosis: Secondary | ICD-10-CM

## 2023-01-31 DIAGNOSIS — E559 Vitamin D deficiency, unspecified: Secondary | ICD-10-CM | POA: Diagnosis not present

## 2023-01-31 DIAGNOSIS — Z79899 Other long term (current) drug therapy: Secondary | ICD-10-CM | POA: Diagnosis not present

## 2023-01-31 DIAGNOSIS — M8589 Other specified disorders of bone density and structure, multiple sites: Secondary | ICD-10-CM | POA: Diagnosis not present

## 2023-01-31 DIAGNOSIS — I471 Supraventricular tachycardia, unspecified: Secondary | ICD-10-CM

## 2023-01-31 DIAGNOSIS — M8580 Other specified disorders of bone density and structure, unspecified site: Secondary | ICD-10-CM

## 2023-01-31 DIAGNOSIS — Z0000 Encounter for general adult medical examination without abnormal findings: Secondary | ICD-10-CM

## 2023-01-31 DIAGNOSIS — K449 Diaphragmatic hernia without obstruction or gangrene: Secondary | ICD-10-CM | POA: Diagnosis not present

## 2023-01-31 DIAGNOSIS — E785 Hyperlipidemia, unspecified: Secondary | ICD-10-CM

## 2023-01-31 DIAGNOSIS — K219 Gastro-esophageal reflux disease without esophagitis: Secondary | ICD-10-CM

## 2023-01-31 DIAGNOSIS — R7309 Other abnormal glucose: Secondary | ICD-10-CM

## 2023-01-31 DIAGNOSIS — Z780 Asymptomatic menopausal state: Secondary | ICD-10-CM | POA: Diagnosis not present

## 2023-01-31 LAB — LIPID PANEL
Cholesterol: 198 mg/dL (ref 0–200)
HDL: 62.3 mg/dL (ref >39.00)
LDL Cholesterol: 113 mg/dL — ABNORMAL HIGH (ref 0–99)
NonHDL: 135.44
Total CHOL/HDL Ratio: 3
Triglycerides: 110 mg/dL (ref 0.0–149.0)
VLDL: 22 mg/dL (ref 0.0–40.0)

## 2023-01-31 LAB — MAGNESIUM: Magnesium: 2 mg/dL (ref 1.5–2.5)

## 2023-01-31 LAB — HEMOGLOBIN A1C: Hgb A1c MFr Bld: 5.5 % (ref 4.6–6.5)

## 2023-01-31 MED ORDER — METOPROLOL SUCCINATE ER 25 MG PO TB24: 25.0000 mg | ORAL_TABLET | Freq: Every day | ORAL | 0 refills | Status: DC

## 2023-01-31 MED ORDER — OMEPRAZOLE 20 MG PO CPDR: 20.0000 mg | DELAYED_RELEASE_CAPSULE | Freq: Every day | ORAL | 1 refills | Status: DC

## 2023-01-31 NOTE — Telephone Encounter (Signed)
Spoke with patient regarding results/recommendations.  

## 2023-01-31 NOTE — Patient Instructions (Addendum)
Return in about 1 year (around 02/01/2024) for cpe (20 min), Routine chronic condition follow-up.        Great to see you today.  I have refilled the medication(s) we provide.   If labs were collected, we will inform you of lab results once received either by echart message or telephone call.   - echart message- for normal results that have been seen by the patient already.   - telephone call: abnormal results or if patient has not viewed results in their echart.

## 2023-01-31 NOTE — Progress Notes (Signed)
Patient ID: Jenny Hicks, female  DOB: 07-19-1951, 72 y.o.   MRN: [id number] Patient Care Team    Relationship Specialty Notifications Start End  Ma Hillock, DO PCP - General Family Medicine  09/16/16   Pixie Casino, MD Consulting Physician Cardiology  09/16/16   Vania Rea, MD Consulting Physician Obstetrics and Gynecology  09/16/16   Newt Minion, MD Consulting Physician Orthopedic Surgery  11/28/18   Marti Sleigh, MD Referring Physician Gynecology  01/06/21   Mauri Pole, MD Consulting Physician Gastroenterology  10/25/22     Chief Complaint  Patient presents with   Annual Exam    Pt is fasting     Subjective: Jenny Hicks is a 72 y.o.  Female  present for CPE and Chronic Conditions/illness Management All past medical history, surgical history, allergies, family history, immunizations, medications and social history were updated in the electronic medical record today. All recent labs, ED visits and hospitalizations within the last year were reviewed.  Health maintenance:  Colonoscopy: completed 2022- 3 yr follow up, by Dr. Silverio Decamp Mammogram: completed:has scheduled 01/2022. [facility]- [place]. FHx present in mother (prostate and ovarian cancers also in many members of family on paternal side)> scheduled 01/31/2023.  Cervical cancer screening: Hysterectomy 1989 (non-cancerous reasons), routinely follows with Dr. Stann Mainland (GYN)  Immunizations: td 12/2017 UTD, Influenza UTD 2023(encouraged yearly), PNA series completed 2019.  zostavax completed. shingrix completed Infectious disease screening:Hep C and HIV completed DEXA: 01/26/2021, Osteopenia (-1.7).  [facility]-[place]. Vitamin D within normal limits. Scheduled 2024 Patient has a Dental home. Hospitalizations/ED visits: reviewed     01/31/2023    8:19 AM 10/20/2022   10:53 AM 10/07/2021   11:52 AM 01/06/2021    1:55 PM 08/27/2020    3:44 PM  Depression screen PHQ 2/9  Decreased Interest 0 0 0 0 0  Down, Depressed,  Hopeless 0 0 0 0 0  PHQ - 2 Score 0 0 0 0 0       No data to display          Immunization History  Administered Date(s) Administered   Influenza Split 08/26/2011, 08/28/2012   Influenza, High Dose Seasonal PF 07/18/2018, 09/03/2019   Influenza-Unspecified 08/24/2014, 09/01/2015, 07/25/2016, 08/22/2017, 09/03/2019, 09/09/2020, 08/19/2021, 08/19/2022   PFIZER(Purple Top)SARS-COV-2 Vaccination 12/17/2019, 01/09/2020, 08/22/2020, 08/12/2021   Pneumococcal Conjugate-13 10/08/2016   Pneumococcal Polysaccharide-23 03/15/2013, 12/15/2017   Td 12/15/2017   Tdap 11/09/2007   Zoster Recombinat (Shingrix) 01/22/2021, 05/28/2021   Zoster, Live 11/06/2011     Past Medical History:  Diagnosis Date   Abdominal pain 10/25/2022   Benign cyst of kidney    Bone spur of right foot    Chicken pox    Colitis 01/06/2021   DDD (degenerative disc disease), cervical    Diverticulitis 10/06/2020   treatment given in ER   Diverticulosis 10/06/2020   GERD (gastroesophageal reflux disease)    Heart palpitations    History of nuclear stress test 03/2013   stress myoview; normal study; SVT (HR 200) - adenosine   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 03/2013 Echo: EF 55-60%, mild MVP, mild to mod MR.   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  Recommended continued compression for symptomatic improvement.  She can continue Advil as needed for pain.  Recommend no lifting more than 5 pounds for another 3 weeks and then gradual increase to tolerance.  She can move the wrist and the elbow to tolerance   Scoliosis    SVT (supraventricular tachycardia)    a. 03/2013 occurred during ex MV-->resolved with adenosine and carotid massage.   Allergies  Allergen Reactions   Hydrocodone-Acetaminophen Other (See Comments)    Made her  BP go way up   Pantoprazole Sodium Palpitations   Past Surgical History:  Procedure Laterality Date   COLONOSCOPY  01/12/2011   DIAGNOSTIC MAMMOGRAM  08/06/2014   digital   TRANSTHORACIC ECHOCARDIOGRAM  03/2013   EF 55-60%, mild conc hypertrophy; mild MVP, mild-mod MR   UMBILICAL HERNIA REPAIR  03/02/2021   with colprpex   VAGINAL HYSTERECTOMY  1989   partial; pt has both ovaries   Family History  Problem Relation Age of Onset   Heart attack Mother    Hypertension Mother        Alzheimer's   Stroke Mother        Afib   Breast cancer Mother    Osteoporosis Mother        cervical/femur fracture   Kidney disease Mother    Myasthenia gravis Father    Prostate cancer Father    Prostate cancer Brother    Arthritis Maternal Aunt    Arthritis Maternal Uncle    Prostate cancer Maternal Uncle    Early death Maternal Grandmother        died after 02/17/2023 baby   Heart disease Maternal Grandfather    Congestive Heart Failure Maternal Grandfather    Ovarian cancer Paternal Grandmother    Other Daughter        mitral valve damaged had to be repaired, cause unknown   Breast cancer Cousin    Colon cancer Neg Hx    Esophageal cancer Neg Hx    Rectal cancer Neg Hx    Colon polyps Neg Hx    Stomach cancer Neg Hx    Social History   Social History Narrative   Married to [place], they have 5 children.    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   Drinks caffeine, takes a daily vitamin   Wears seatbelt, smoke detector in the home, no firearms in the home.    Feels safe in her relationships.     Allergies as of 01/31/2023       Reactions   Hydrocodone-acetaminophen Other (See Comments)   Made her BP go way up   Pantoprazole Sodium Palpitations        Medication List        Accurate as of January 31, 2023  8:43 AM. If you have any questions, ask your nurse or doctor.          STOP taking these medications    predniSONE 50 MG tablet Commonly known  as: DELTASONE Stopped by: Howard Pouch, DO       TAKE these medications    CALCIUM 600 + D PO Take 600 mg by mouth daily. Vit D 800 units   CENTRUM PO Take by mouth daily.   metoprolol succinate 25 MG 24 hr tablet Commonly known as: TOPROL-XL Take 1 tablet (25 mg total) by mouth daily. What changed: additional instructions Changed by: Howard Pouch, DO   omeprazole 20 MG capsule Commonly known as: PRILOSEC Take 1 capsule (20 mg total) by mouth daily.   Vitamin D (Cholecalciferol)  25 MCG (1000 UT) Tabs Take by mouth. 2,000 units 3x a week        All past medical history, surgical history, allergies, family history, immunizations andmedications were updated in the EMR today and reviewed under the history and medication portions of their EMR.      ROS 14 pt review of systems performed and negative (unless mentioned in an HPI)  Objective: BP 125/73   Pulse 63   Temp 98.1 F (36.7 C)   Ht 5' 3.78" (1.62 m)   Wt 160 lb 6.4 oz (72.8 kg)   SpO2 96%   BMI 27.72 kg/m  Physical Exam Vitals and nursing note reviewed.  Constitutional:      General: She is not in acute distress.    Appearance: Normal appearance. She is not ill-appearing or toxic-appearing.  HENT:     Head: Normocephalic and atraumatic.     Right Ear: Tympanic membrane, ear canal and external ear normal. There is no impacted cerumen.     Left Ear: Tympanic membrane, ear canal and external ear normal. There is no impacted cerumen.     Nose: No congestion or rhinorrhea.     Mouth/Throat:     Mouth: Mucous membranes are moist.     Pharynx: Oropharynx is clear. No oropharyngeal exudate or posterior oropharyngeal erythema.  Eyes:     General: No scleral icterus.       Right eye: No discharge.        Left eye: No discharge.     Extraocular Movements: Extraocular movements intact.     Conjunctiva/sclera: Conjunctivae normal.     Pupils: Pupils are equal, round, and reactive to light.  Cardiovascular:      Rate and Rhythm: Normal rate and regular rhythm.     Pulses: Normal pulses.     Heart sounds: Normal heart sounds. No murmur heard.    No friction rub. No gallop.  Pulmonary:     Effort: Pulmonary effort is normal. No respiratory distress.     Breath sounds: Normal breath sounds. No stridor. No wheezing, rhonchi or rales.  Chest:     Chest wall: No tenderness.  Abdominal:     General: Abdomen is flat. Bowel sounds are normal. There is no distension.     Palpations: Abdomen is soft. There is no mass.     Tenderness: There is no abdominal tenderness. There is no right CVA tenderness, left CVA tenderness, guarding or rebound.     Hernia: No hernia is present.  Musculoskeletal:        General: No swelling, tenderness or deformity. Normal range of motion.     Cervical back: Normal range of motion and neck supple. No rigidity or tenderness.     Right lower leg: No edema.     Left lower leg: No edema.  Lymphadenopathy:     Cervical: No cervical adenopathy.  Skin:    General: Skin is warm and dry.     Coloration: Skin is not jaundiced or pale.     Findings: No bruising, erythema, lesion or rash.  Neurological:     General: No focal deficit present.     Mental Status: She is alert and oriented to person, place, and time. Mental status is at baseline.     Cranial Nerves: No cranial nerve deficit.     Sensory: No sensory deficit.     Motor: No weakness.     Coordination: Coordination normal.     Gait: Gait normal.  Deep Tendon Reflexes: Reflexes normal.  Psychiatric:        Mood and Affect: Mood normal.        Behavior: Behavior normal.        Thought Content: Thought content normal.        Judgment: Judgment normal.      No results found.  Assessment/plan: Jenny Hicks is a 72 y.o. female present for CPE and Chronic Conditions/illness Management Paroxysmal SVT (supraventricular tachycardia) (HCC)/MVP/MI Historically, Managed by cardio on metoprolol  - agreed to take over  medication management.  -T SH collected today 12.5 mg daily (as needed) pt stopped secondary to low pressures.  Encouraged her to start baby ASA  Osteopenia, unspecified location/Vitamin D deficiency -Vitamin D levels collected today -DEXA is ordered and scheduled for March 2024  GERD: Continue protonix Vit b12 and mag collected today  Hyperlipidemia, unspecified hyperlipidemia type/Overweight (BMI 25.0-29.9) CBC, CMP, TSH and lipids collected today. Diet controlled  Elevated glucose: A1c collected today Encounter for preventive health examination Patient was encouraged to exercise greater than 150 minutes a week. Patient was encouraged to choose a diet filled with fresh fruits and vegetables, and lean meats. AVS provided to patient today for education/recommendation on gender specific health and safety maintenance. Colonoscopy: completed 2022- 3 yr follow up, by Dr. Silverio Decamp Mammogram: completed:has scheduled 01/2022. BC- [place]. FHx present in mother (prostate and ovarian cancers also in many members of family on paternal side)> scheduled 01/31/2023.  Cervical cancer screening: Hysterectomy 1989 (non-cancerous reasons), routinely follows with Dr. Stann Mainland (GYN)  Immunizations: td 12/2017 UTD, Influenza UTD 2023(encouraged yearly), PNA series completed 2019.  zostavax completed. shingrix completed Infectious disease screening:Hep C and HIV completed DEXA: 01/26/2021, Osteopenia (-1.7).  [facility]-[place]. Vitamin D within normal limits. Scheduled 2024  Return in about 1 year (around 02/01/2024) for cpe (20 min), Routine chronic condition follow-up.    Orders Placed This Encounter  Procedures   Hemoglobin A1c   Lipid panel   TSH   Vitamin D (25 hydroxy)   B12   Magnesium   Meds ordered this encounter  Medications   metoprolol succinate (TOPROL-XL) 25 MG 24 hr tablet    Sig: Take 1 tablet (25 mg total) by mouth daily.    Dispense:  90 tablet    Refill:  0    Hold until pt requests    omeprazole (PRILOSEC) 20 MG capsule    Sig: Take 1 capsule (20 mg total) by mouth daily.    Dispense:  90 capsule    Refill:  1   Referral Orders  No referral(s) requested today      Electronically signed by: Howard Pouch, [place]

## 2023-01-31 NOTE — Telephone Encounter (Signed)
Her bone density results are back with they T-score of -1.9. This patient is considered osteopenic/low bone mass.  In comparison to prior bone density it looks like there is a mild decrease in the bone density from prior. Score at that time was -1.7, and today's -1.9.  Continue vitamin D/calcium supplementation. Continue routine exercise Caffeine and alcohol moderation.

## 2023-02-01 LAB — VITAMIN D 25 HYDROXY (VIT D DEFICIENCY, FRACTURES): VITD: 38.62 ng/mL (ref 30.00–100.00)

## 2023-02-01 LAB — VITAMIN B12: Vitamin B-12: 354 pg/mL (ref 211–911)

## 2023-02-01 LAB — TSH: TSH: 2.19 u[IU]/mL (ref 0.35–5.50)

## 2023-02-07 NOTE — Progress Notes (Signed)
No evidence of DVT on vascular ultrasound study.

## 2023-02-07 NOTE — Progress Notes (Signed)
Right ankle x-ray shows no fractures or dislocation.  Some soft tissue swelling is present.

## 2023-02-07 NOTE — Progress Notes (Signed)
Right foot x-ray shows no fractures or dislocation.  You have some arthritis changes in the big toe.

## 2023-02-14 ENCOUNTER — Other Ambulatory Visit: Payer: Self-pay | Admitting: Family Medicine

## 2023-02-14 DIAGNOSIS — Z1231 Encounter for screening mammogram for malignant neoplasm of breast: Secondary | ICD-10-CM

## 2023-03-18 ENCOUNTER — Ambulatory Visit (HOSPITAL_COMMUNITY)
Admission: RE | Admit: 2023-03-18 | Discharge: 2023-03-18 | Disposition: A | Discharge status: Home / Self Care | Payer: PPO | Source: Ambulatory Visit | Attending: Family Medicine | Admitting: Family Medicine

## 2023-03-18 ENCOUNTER — Encounter: Payer: Self-pay | Admitting: Family Medicine

## 2023-03-18 ENCOUNTER — Ambulatory Visit (INDEPENDENT_AMBULATORY_CARE_PROVIDER_SITE_OTHER): Payer: PPO | Admitting: Family Medicine

## 2023-03-18 ENCOUNTER — Other Ambulatory Visit: Payer: Self-pay

## 2023-03-18 ENCOUNTER — Telehealth: Payer: Self-pay | Admitting: Family Medicine

## 2023-03-18 VITALS — BP 124/85 | HR 72 | Temp 97.9°F | Wt 158.6 lb

## 2023-03-18 DIAGNOSIS — R0600 Dyspnea, unspecified: Secondary | ICD-10-CM | POA: Diagnosis not present

## 2023-03-18 DIAGNOSIS — M7989 Other specified soft tissue disorders: Secondary | ICD-10-CM

## 2023-03-18 DIAGNOSIS — M79605 Pain in left leg: Secondary | ICD-10-CM

## 2023-03-18 MED ORDER — POTASSIUM CHLORIDE CRYS ER 20 MEQ PO TBCR: 20.0000 meq | EXTENDED_RELEASE_TABLET | Freq: Every day | ORAL | 0 refills | Status: DC

## 2023-03-18 MED ORDER — FUROSEMIDE 20 MG PO TABS
20.0000 mg | ORAL_TABLET | Freq: Every day | ORAL | 0 refills | Status: DC
Start: 2023-03-18 — End: 2023-03-22

## 2023-03-18 NOTE — Progress Notes (Signed)
Left lower extremity venous duplex has been completed. Preliminary results can be found in CV Proc through chart review.  Results were faxed to Dr. Claiborne Billings.  03/18/23 1:15 PM Olen Cordial RVT

## 2023-03-18 NOTE — Telephone Encounter (Signed)
Please inform pt: Her [place] is negative for blood clot.  Her d-dimer test is normal for age.  The last test will not be resulted until Monday. CXR is read as normal.   I recommend we treat with lasix (diuretic) 1 tab daily for 3 days, and reevaluate next week. Take the potassium supplement prescribed with the lasix prescribed.  Follow up next week for recheck.

## 2023-03-18 NOTE — Patient Instructions (Addendum)
We will call you with results and further plan.        Great to see you today.  I have refilled the medication(s) we provide.   If labs were collected, we will inform you of lab results once received either by echart message or telephone call.   - echart message- for normal results that have been seen by the patient already.   - telephone call: abnormal results or if patient has not viewed results in their echart.

## 2023-03-18 NOTE — Progress Notes (Signed)
Jenny Hicks , Nov 13, 1950, 72 y.o., female MRN: [id number] Patient Care Team    Relationship Specialty Notifications Start End  Natalia Leatherwood, DO PCP - General Family Medicine  09/16/16   Chrystie Nose, MD Consulting Physician Cardiology  09/16/16   Annamaria Helling, MD Consulting Physician Obstetrics and Gynecology  09/16/16   Nadara Mustard, MD Consulting Physician Orthopedic Surgery  11/28/18   Jerrell Mylar, MD Referring Physician Gynecology  01/06/21   Napoleon Form, MD Consulting Physician Gastroenterology  10/25/22     Chief Complaint  Patient presents with   Leg Swelling    Left leg; swelling gets worse throughout the day. Notice on Tuesday.Pt is fasting     Subjective: Jenny Hicks is a 72 y.o. Pt presents for an OV with complaints of left leg swelling of 4 days duration.  She reports it is swollen in the morning, but worsens throughout the day.  She has chronic swelling in her right leg, her left leg typically does not swell.  She does travel frequently.  She is not on  hormone therapy. She does endorse waking up in a sweat last night.  She reports mild shortness of breath, and that she just does not seem like she can take a deep breath. She has a history of supraventricular tachycardia/lown ganong levine syndrome.  She does report she feels like her heart was pounding last night. Last echo 2020 with normal ejection fraction      03/18/2023    8:11 AM 01/31/2023    8:19 AM 10/20/2022   10:53 AM 10/07/2021   11:52 AM 01/06/2021    1:55 PM  Depression screen PHQ 2/9  Decreased Interest 0 0 0 0 0  Down, Depressed, Hopeless 0 0 0 0 0  PHQ - 2 Score 0 0 0 0 0    Allergies  Allergen Reactions   Hydrocodone-Acetaminophen Other (See Comments)    Made her BP go way up   Pantoprazole Sodium Palpitations   Social History   Social History Narrative   Married to [place], they have 5 children.    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   Drinks caffeine, takes a daily vitamin   Wears seatbelt, smoke detector in the home, no firearms in the home.    Feels safe in her relationships.    Past Medical History:  Diagnosis Date   Abdominal pain 10/25/2022   Benign cyst of kidney    Bone spur of right foot    Chicken pox    Colitis 01/06/2021   DDD (degenerative disc disease), cervical    Diverticulitis 10/06/2020   treatment given in ER   Diverticulosis 10/06/2020   GERD (gastroesophageal reflux disease)    Heart palpitations    History of nuclear stress test 03/2013   stress myoview; normal study; SVT (HR 200) - adenosine   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 03/2013 Echo: EF 55-60%, mild MVP, mild to mod MR.   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  Recommended continued compression for symptomatic improvement.  She can continue Advil as needed for pain.  Recommend no lifting more than 5 pounds for another 3 weeks and then gradual increase  to tolerance.  She can move the wrist and the elbow to tolerance   Scoliosis    SVT (supraventricular tachycardia)    a. 03/2013 occurred during ex MV-->resolved with adenosine and carotid massage.   Past Surgical History:  Procedure Laterality Date   COLONOSCOPY  01/12/2011   DIAGNOSTIC MAMMOGRAM  08/06/2014   digital   TRANSTHORACIC ECHOCARDIOGRAM  03/2013   EF 55-60%, mild conc hypertrophy; mild MVP, mild-mod MR   UMBILICAL HERNIA REPAIR  03/02/2021   with colprpex   VAGINAL HYSTERECTOMY  1989   partial; pt has both ovaries   Family History  Problem Relation Age of Onset   Heart attack Mother    Hypertension Mother        Alzheimer's   Stroke Mother        Afib   Breast cancer Mother    Osteoporosis Mother        cervical/femur fracture   Kidney disease Mother    Myasthenia  gravis Father    Prostate cancer Father    Prostate cancer Brother    Arthritis Maternal Aunt    Arthritis Maternal Uncle    Prostate cancer Maternal Uncle    Early death Maternal Grandmother        died after 04/08/23 baby   Heart disease Maternal Grandfather    Congestive Heart Failure Maternal Grandfather    Ovarian cancer Paternal Grandmother    Other Daughter        mitral valve damaged had to be repaired, cause unknown   Breast cancer Cousin    Colon cancer Neg Hx    Esophageal cancer Neg Hx    Rectal cancer Neg Hx    Colon polyps Neg Hx    Stomach cancer Neg Hx    Allergies as of 03/18/2023       Reactions   Hydrocodone-acetaminophen Other (See Comments)   Made her BP go way up   Pantoprazole Sodium Palpitations        Medication List        Accurate as of Mar 18, 2023  8:55 AM. If you have any questions, ask your nurse or doctor.          CALCIUM 600 + D PO Take 600 mg by mouth daily. Vit D 800 units   CENTRUM PO Take by mouth daily.   metoprolol succinate 25 MG 24 hr tablet Commonly known as: TOPROL-XL Take 1 tablet (25 mg total) by mouth daily.   omeprazole 20 MG capsule Commonly known as: PRILOSEC Take 1 capsule (20 mg total) by mouth daily.   Vitamin D (Cholecalciferol) 25 MCG (1000 UT) Tabs Take by mouth. 2,000 units 3x a week        All past medical history, surgical history, allergies, family history, immunizations andmedications were updated in the EMR today and reviewed under the history and medication portions of their EMR.     Review of Systems  Constitutional:  Positive for diaphoresis. Negative for chills.  Respiratory:  Positive for shortness of breath. Negative for cough and hemoptysis.   Cardiovascular:  Positive for palpitations and leg swelling. Negative for chest pain.  Musculoskeletal:  Positive for back pain.  Skin:  Negative for rash.  Neurological:  Negative for dizziness, speech change, focal weakness, weakness and  headaches.   Negative, with the exception of above mentioned in HPI   Objective:  BP 124/85   Pulse 72   Temp 97.9 F (36.6 C)   Wt 158 lb 9.6  oz (71.9 kg)   SpO2 99%   BMI 27.41 kg/m  Body mass index is 27.41 kg/m. Physical Exam Vitals and nursing note reviewed.  Constitutional:      General: She is not in acute distress.    Appearance: Normal appearance. She is not ill-appearing, toxic-appearing or diaphoretic.  HENT:     Head: Normocephalic and atraumatic.  Eyes:     General: No scleral icterus.       Right eye: No discharge.        Left eye: No discharge.     Extraocular Movements: Extraocular movements intact.     Conjunctiva/sclera: Conjunctivae normal.     Pupils: Pupils are equal, round, and reactive to light.  Cardiovascular:     Rate and Rhythm: Normal rate and regular rhythm.     Heart sounds: No murmur heard.    No friction rub. No gallop.  Pulmonary:     Effort: Pulmonary effort is normal. No respiratory distress.     Breath sounds: Normal breath sounds. No wheezing, rhonchi or rales.  Musculoskeletal:        General: Swelling and tenderness present.     Right lower leg: Edema (trace) present.     Left lower leg: Edema (+1) present.     Comments: LLE: no erythema or bruising reported. + swelling.  +Homans left calf.   Skin:    General: Skin is warm.     Findings: No rash.  Neurological:     Mental Status: She is alert and oriented to person, place, and time. Mental status is at baseline.     Motor: No weakness.     Gait: Gait normal.  Psychiatric:        Mood and Affect: Mood normal.        Behavior: Behavior normal.        Thought Content: Thought content normal.        Judgment: Judgment normal.     No results found. No results found. No results found for this or any previous visit (from the past 24 hour(s)).  Assessment/Plan: Jenny Hicks is a 72 y.o. female present for OV for  Left leg swelling/pain and left leg VSS She reports mild  dyspnea and a positive Homans sign on exam today.  Therefore will obtain stat ultrasound of left lower extremity.  Also ordered D-dimer, in the event ultrasound is negative and D-dimer positive, would encourage her to be seen through the ED since she is reporting mild dyspnea.   - D-Dimer, Quantitative - NT-proBNP - US Venous Img Lower Unilateral Left; Future Consider echo and diuretic after results received.  Dyspnea, unspecified type-diaphoresis - DG Chest 2 View; Future Mild dyspnea, oxygen saturation 99%.  Vital stable. Patient understands if D-dimer is positive, she will need to go through the emergency room for further evaluation CT stat and treatment initiation.  Reviewed expectations re: course of current medical issues. Discussed self-management of symptoms. Outlined signs and symptoms indicating need for more acute intervention. Patient verbalized understanding and all questions were answered. Patient received an After-Visit Summary.    Orders Placed This Encounter  Procedures   DG Chest 2 View   D-Dimer, Quantitative   NT-proBNP   VAS [place] LOWER EXTREMITY VENOUS (DVT)   No orders of the defined types were placed in this encounter.  Referral Orders  No referral(s) requested today     Note is dictated utilizing voice recognition software. Although note has been proof read prior to signing,  occasional typographical errors still can be missed. If any questions arise, please do not hesitate to call for verification.   electronically signed by:  Howard Pouch, DO  [place]

## 2023-03-18 NOTE — Telephone Encounter (Signed)
Spoke with patient regarding results/recommendations.  

## 2023-03-21 LAB — D-DIMER, QUANTITATIVE: D-Dimer, Quant: 0.52 mcg/mL FEU — ABNORMAL HIGH (ref <0.50)

## 2023-03-21 LAB — NT-PROBNP: Pro B Natriuretic peptide (BNP): 106 pg/mL (ref <125)

## 2023-03-22 ENCOUNTER — Encounter: Payer: Self-pay | Admitting: Family Medicine

## 2023-03-22 ENCOUNTER — Ambulatory Visit (INDEPENDENT_AMBULATORY_CARE_PROVIDER_SITE_OTHER): Payer: PPO | Admitting: Family Medicine

## 2023-03-22 VITALS — BP 118/72 | HR 64 | Temp 98.4°F | Wt 156.0 lb

## 2023-03-22 DIAGNOSIS — R600 Localized edema: Secondary | ICD-10-CM

## 2023-03-22 LAB — BASIC METABOLIC PANEL
BUN: 19 mg/dL (ref 6–23)
CO2: 32 mEq/L (ref 19–32)
Calcium: 9.5 mg/dL (ref 8.4–10.5)
Chloride: 101 mEq/L (ref 96–112)
Creatinine, Ser: 0.72 mg/dL (ref 0.40–1.20)
GFR: 83.87 mL/min (ref >60.00)
Glucose, Bld: 84 mg/dL (ref 70–99)
Potassium: 3.9 mEq/L (ref 3.5–5.1)
Sodium: 142 mEq/L (ref 135–145)

## 2023-03-22 MED ORDER — POTASSIUM CHLORIDE CRYS ER 20 MEQ PO TBCR: 20.0000 meq | EXTENDED_RELEASE_TABLET | Freq: Every day | ORAL | 5 refills | Status: DC | PRN

## 2023-03-22 MED ORDER — FUROSEMIDE 20 MG PO TABS: 20.0000 mg | ORAL_TABLET | Freq: Every day | ORAL | 5 refills | Status: DC | PRN

## 2023-03-22 NOTE — Patient Instructions (Signed)
Low sodium diet Keep feet elevated when able Compression socks/stockings can help.

## 2023-03-22 NOTE — Progress Notes (Signed)
Jenny Hicks , 1950/11/29, 72 y.o., female MRN: [id number] Patient Care Team    Relationship Specialty Notifications Start End  Natalia Leatherwood, DO PCP - General Family Medicine  09/16/16   Chrystie Nose, MD Consulting Physician Cardiology  09/16/16   Annamaria Helling, MD Consulting Physician Obstetrics and Gynecology  09/16/16   Nadara Mustard, MD Consulting Physician Orthopedic Surgery  11/28/18   Jerrell Mylar, MD Referring Physician Gynecology  01/06/21   Napoleon Form, MD Consulting Physician Gastroenterology  10/25/22     Chief Complaint  Patient presents with   Leg Swelling    Has gotten better     Subjective: Jenny Hicks is a 72 y.o. Pt presents for an OV for follow up on leg swelling. [place] negative for DVT. proBNP normal. Lasix started with kdur.  Today pt reports lower ext edema is resolved, even in her right leg which is chronic.  Weight:158.6> 156  Prior note: complaints of left leg swelling of 4 days duration.  She reports it is swollen in the morning, but worsens throughout the day.  She has chronic swelling in her right leg, her left leg typically does not swell.  She does travel frequently.  She is not on  hormone therapy. She does endorse waking up in a sweat last night.  She reports mild shortness of breath, and that she just does not seem like she can take a deep breath. She has a history of supraventricular tachycardia/lown ganong levine syndrome.  She does report she feels like her heart was pounding last night. Last echo 2020 with normal ejection fraction      03/18/2023    8:11 AM 01/31/2023    8:19 AM 10/20/2022   10:53 AM 10/07/2021   11:52 AM 01/06/2021    1:55 PM  Depression screen PHQ 2/9  Decreased Interest 0 0 0 0 0  Down, Depressed, Hopeless 0 0 0 0 0  PHQ - 2 Score 0 0 0 0 0    Allergies  Allergen Reactions   Hydrocodone-Acetaminophen Other (See Comments)    Made her BP go way up   Pantoprazole Sodium Palpitations    Social History   Social History Narrative   Married to [place], they have 5 children.    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   Drinks caffeine, takes a daily vitamin   Wears seatbelt, smoke detector in the home, no firearms in the home.    Feels safe in her relationships.    Past Medical History:  Diagnosis Date   Abdominal pain 10/25/2022   Benign cyst of kidney    Bone spur of right foot    Chicken pox    Colitis 01/06/2021   DDD (degenerative disc disease), cervical    Diverticulitis 10/06/2020   treatment given in ER   Diverticulosis 10/06/2020   GERD (gastroesophageal reflux disease)    Heart palpitations    History of nuclear stress test 03/2013   stress myoview; normal study; SVT (HR 200) - adenosine   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 03/2013 Echo: EF 55-60%, mild MVP, mild to mod MR.   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  Recommended continued compression  for symptomatic improvement.  She can continue Advil as needed for pain.  Recommend no lifting more than 5 pounds for another 3 weeks and then gradual increase to tolerance.  She can move the wrist and the elbow to tolerance   Scoliosis    SVT (supraventricular tachycardia)    a. 03/2013 occurred during ex MV-->resolved with adenosine and carotid massage.   Past Surgical History:  Procedure Laterality Date   COLONOSCOPY  01/12/2011   DIAGNOSTIC MAMMOGRAM  08/06/2014   digital   TRANSTHORACIC ECHOCARDIOGRAM  03/2013   EF 55-60%, mild conc hypertrophy; mild MVP, mild-mod MR   UMBILICAL HERNIA REPAIR  03/02/2021   with colprpex   VAGINAL HYSTERECTOMY  1989   partial; pt has both ovaries   Family History  Problem Relation Age of Onset   Heart attack Mother    Hypertension Mother        Alzheimer's    Stroke Mother        Afib   Breast cancer Mother    Osteoporosis Mother        cervical/femur fracture   Kidney disease Mother    Myasthenia gravis Father    Prostate cancer Father    Prostate cancer Brother    Arthritis Maternal Aunt    Arthritis Maternal Uncle    Prostate cancer Maternal Uncle    Early death Maternal Grandmother        died after April 17, 2023 baby   Heart disease Maternal Grandfather    Congestive Heart Failure Maternal Grandfather    Ovarian cancer Paternal Grandmother    Other Daughter        mitral valve damaged had to be repaired, cause unknown   Breast cancer Cousin    Colon cancer Neg Hx    Esophageal cancer Neg Hx    Rectal cancer Neg Hx    Colon polyps Neg Hx    Stomach cancer Neg Hx    Allergies as of 03/22/2023       Reactions   Hydrocodone-acetaminophen Other (See Comments)   Made her BP go way up   Pantoprazole Sodium Palpitations        Medication List        Accurate as of Mar 22, 2023 10:25 AM. If you have any questions, ask your nurse or doctor.          CALCIUM 600 + D PO Take 600 mg by mouth daily. Vit D 800 units   CENTRUM PO Take by mouth daily.   furosemide 20 MG tablet Commonly known as: LASIX Take 1 tablet (20 mg total) by mouth daily.   metoprolol succinate 25 MG 24 hr tablet Commonly known as: TOPROL-XL Take 1 tablet (25 mg total) by mouth daily.   omeprazole 20 MG capsule Commonly known as: PRILOSEC Take 1 capsule (20 mg total) by mouth daily.   potassium chloride SA 20 MEQ tablet Commonly known as: KLOR-CON M Take 1 tablet (20 mEq total) by mouth daily.   Vitamin D (Cholecalciferol) 25 MCG (1000 UT) Tabs Take by mouth. 2,000 units 3x a week        All past medical history, surgical history, allergies, family history, immunizations andmedications were updated in the EMR today and reviewed under the history and medication portions of their EMR.     Review of Systems  Constitutional:  Negative for  chills and diaphoresis.  Respiratory:  Negative for cough, hemoptysis and shortness of breath.   Cardiovascular:  Negative for chest pain, palpitations  and leg swelling.  Musculoskeletal:  Negative for back pain.  Skin:  Negative for rash.  Neurological:  Negative for dizziness, speech change, focal weakness, weakness and headaches.   Negative, with the exception of above mentioned in HPI   Objective:  BP 118/72   Pulse 64   Temp 98.4 F (36.9 C)   Wt 156 lb (70.8 kg)   SpO2 94%   BMI 26.96 kg/m  Body mass index is 26.96 kg/m. Physical Exam Vitals and nursing note reviewed.  Constitutional:      General: She is not in acute distress.    Appearance: Normal appearance. She is not ill-appearing, toxic-appearing or diaphoretic.  HENT:     Head: Normocephalic and atraumatic.  Eyes:     General: No scleral icterus.       Right eye: No discharge.        Left eye: No discharge.     Extraocular Movements: Extraocular movements intact.     Conjunctiva/sclera: Conjunctivae normal.     Pupils: Pupils are equal, round, and reactive to light.  Cardiovascular:     Rate and Rhythm: Normal rate and regular rhythm.     Heart sounds: No murmur heard.    No friction rub. No gallop.  Pulmonary:     Effort: Pulmonary effort is normal. No respiratory distress.     Breath sounds: Normal breath sounds. No wheezing, rhonchi or rales.  Musculoskeletal:        General: No swelling or tenderness.     Right lower leg: No edema.     Left lower leg: No edema.  Skin:    General: Skin is warm.     Findings: No rash.  Neurological:     Mental Status: She is alert and oriented to person, place, and time. Mental status is at baseline.     Motor: No weakness.     Gait: Gait normal.  Psychiatric:        Mood and Affect: Mood normal.        Behavior: Behavior normal.        Thought Content: Thought content normal.        Judgment: Judgment normal.     No results found. No results found. No  results found for this or any previous visit (from the past 24 hour(s)).  Assessment/Plan: TRACIE BROSE is a 72 y.o. female present for OV for  LE edema/dyspnea Resolved with lasix x3 days.  BMP collected today to check potassium level after lasix use.  - D-Dimer, Quantitative- WNL of age - NT-proBNP- WNL - US Venous Img Lower Unilateral Left: neg for DVT - lasix 1 tab prn and Kdur called in today. Discussed proper use. If needing more routinely- would recommend follow up and rpt echo.   Reviewed expectations re: course of current medical issues. Discussed self-management of symptoms. Outlined signs and symptoms indicating need for more acute intervention. Patient verbalized understanding and all questions were answered. Patient received an After-Visit Summary.    Orders Placed This Encounter  Procedures   Basic Metabolic Panel (BMET)   No orders of the defined types were placed in this encounter.  Referral Orders  No referral(s) requested today     Note is dictated utilizing voice recognition software. Although note has been proof read prior to signing, occasional typographical errors still can be missed. If any questions arise, please do not hesitate to call for verification.   electronically signed by:  Felix Pacini, DO  [facility] [place]

## 2023-03-28 ENCOUNTER — Ambulatory Visit: Payer: PPO | Admitting: Family Medicine

## 2023-06-23 ENCOUNTER — Encounter: Payer: Self-pay | Admitting: Family Medicine

## 2023-09-26 ENCOUNTER — Ambulatory Visit (INDEPENDENT_AMBULATORY_CARE_PROVIDER_SITE_OTHER): Payer: PPO | Admitting: Urgent Care

## 2023-09-26 VITALS — BP 126/85 | HR 76 | Temp 98.1°F | Wt 157.8 lb

## 2023-09-26 DIAGNOSIS — R4189 Other symptoms and signs involving cognitive functions and awareness: Secondary | ICD-10-CM | POA: Diagnosis not present

## 2023-09-26 DIAGNOSIS — M150 Primary generalized (osteo)arthritis: Secondary | ICD-10-CM | POA: Diagnosis not present

## 2023-09-26 DIAGNOSIS — G3184 Mild cognitive impairment, so stated: Secondary | ICD-10-CM | POA: Insufficient documentation

## 2023-09-26 DIAGNOSIS — R8290 Unspecified abnormal findings in urine: Secondary | ICD-10-CM | POA: Diagnosis not present

## 2023-09-26 DIAGNOSIS — M545 Low back pain, unspecified: Secondary | ICD-10-CM | POA: Diagnosis not present

## 2023-09-26 LAB — POC URINALSYSI DIPSTICK (AUTOMATED)
Bilirubin, UA: NEGATIVE
Blood, UA: NEGATIVE
Glucose, UA: NEGATIVE
Ketones, UA: NEGATIVE
Leukocytes, UA: NEGATIVE
Nitrite, UA: NEGATIVE
Protein, UA: NEGATIVE
Spec Grav, UA: 1.015 (ref 1.010–1.025)
Urobilinogen, UA: 0.2 U/dL
pH, UA: 7.5 (ref 5.0–8.0)

## 2023-09-26 MED ORDER — CELECOXIB 100 MG PO CAPS
100.0000 mg | ORAL_CAPSULE | Freq: Two times a day (BID) | ORAL | 0 refills | Status: DC
Start: 2023-09-26 — End: 2024-02-01

## 2023-09-26 MED ORDER — NITROFURANTOIN MONOHYD MACRO 100 MG PO CAPS
100.0000 mg | ORAL_CAPSULE | Freq: Two times a day (BID) | ORAL | 0 refills | Status: AC
Start: 2023-09-26 — End: 2023-10-01

## 2023-09-26 NOTE — Progress Notes (Signed)
Established Patient Office Visit  Subjective:  Patient ID: Jenny Hicks, female    DOB: May 05, 1951  Age: 72 y.o. MRN: [id number]  Chief Complaint  Patient presents with   Back Pain    Right side back pain for about 2 days.    Pleasant 72yo female with pmh positive for kidney infections and kidney stones presents today due to concern for UTI. She states that two days ago she started having R sided back pain. She denies any heavy lifting or recent injury. She states that it hurts to touch her back. She did try OTC ibuprofen once which did alleviate her symptoms to some degree. She denies any dysuria or hematuria. She states this feels different than last year when she passed a kidney stone. She does report hx of "kidney infections" in the past and feels this may be similar. Does have increased urination and reports it is cloudy. She denies a fever although has had the chills recently. No pelvic pain/ pressure or discharge. No change to bowel or bladder. Took a home AZO UTI test and states all three came back positive.  Additionally pt wanted to discuss her families concern for possible developing Alzheimers disease. States it does run in her family. She notes recently having trouble with short term memory or recall. Will forget why she is doing things and cannot tell her husband about recent conversations she just had.   Lastly, pt is concerned about her neck. Brought in an xray report from 2015 from a chiropractic office showing OA of C spine in addition to multiple levels of DDD. She c/o her neck "popping and cracking" every time she moves it. Denies daily headaches, but endorses intermittent dizziness at times.  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 03/2013 Echo: EF 55-60%, mild MVP, mild to mod MR.   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  Recommended continued compression for symptomatic improvement.  She can continue Advil as needed for pain.  Recommend no lifting more than 5 pounds for another 3 weeks and then gradual increase to tolerance.  She can move the wrist and the elbow to tolerance   Scoliosis    SVT (supraventricular tachycardia)    a. 03/2013 occurred during ex MV-->resolved  with adenosine and carotid massage.   Social History   Tobacco Use   Smoking status: Never   Smokeless tobacco: Never  Vaping Use   Vaping status: Never Used  Substance Use Topics   Alcohol use: No   Drug use: No      ROS: as noted in HPI  Objective:     BP 126/85   Pulse 76   Temp 98.1 F (36.7 C) (Oral)   Wt 157 lb 12.8 oz (71.6 kg)   SpO2 98%   BMI 27.27 kg/m  BP Readings from Last 3 Encounters:  09/26/23 126/85  03/22/23 118/72  03/18/23 124/85   Wt Readings from Last 3 Encounters:  09/26/23 157  lb 12.8 oz (71.6 kg)  03/22/23 156 lb (70.8 kg)  03/18/23 158 lb 9.6 oz (71.9 kg)      Physical Exam Vitals and nursing note reviewed.  Constitutional:      General: She is not in acute distress.    Appearance: Normal appearance. She is not ill-appearing, toxic-appearing or diaphoretic.  HENT:     Head: Normocephalic and atraumatic.  Cardiovascular:     Rate and Rhythm: Normal rate.     Pulses: Normal pulses.  Pulmonary:     Effort: Pulmonary effort is normal. No respiratory distress.  Abdominal:     General: Abdomen is flat. Bowel sounds are normal. There is no distension.     Palpations: Abdomen is soft.     Tenderness: There is no abdominal tenderness. There is no right CVA tenderness, left CVA tenderness, guarding or rebound.  Musculoskeletal:        General: Tenderness present.     Cervical back: Normal range of motion and neck supple. No rigidity.     Thoracic back: Normal. No swelling, spasms, tenderness or bony tenderness. No scoliosis.     Lumbar back: Tenderness present. No swelling, deformity, spasms or bony tenderness. Normal range of motion. Negative right straight leg raise test and negative left straight leg raise test. No scoliosis.       Back:     Comments: No step off deformity Negative supine and seated SLR  Lymphadenopathy:     Cervical: No cervical adenopathy.  Skin:    General: Skin is warm and dry.     Coloration: Skin is not jaundiced.     Findings: No bruising, erythema or rash.  Neurological:     General: No focal deficit present.     Mental Status: She is alert and oriented to person, place, and time.     Sensory: Sensation is intact. No sensory deficit.     Motor: Motor function is intact. No weakness.     Gait: Gait is intact. Gait normal.     Deep Tendon Reflexes: Reflexes are normal and symmetric. Reflexes normal.      Results for orders placed or performed in visit on 09/26/23  POCT Urinalysis Dipstick (Automated)  Result Value Ref  Range   Color, UA yellow    Clarity, UA cloudy    Glucose, UA Negative Negative   Bilirubin, UA Negative    Ketones, UA Negative    Spec Grav, UA 1.015 1.010 - 1.025   Blood, UA Negative    pH, UA 7.5 5.0 - 8.0   Protein, UA Negative Negative   Urobilinogen, UA 0.2 0.2 or 1.0 E.U./dL   Nitrite, UA Negative    Leukocytes, UA Negative Negative      The 10-year ASCVD risk score (Arnett DK, et  al., 2019) is: 11.2%  Assessment & Plan:  Right low back pain, unspecified chronicity, unspecified whether sciatica present -     POCT Urinalysis Dipstick (Automated) -     Nitrofurantoin Monohyd Macro; Take 1 capsule (100 mg total) by mouth 2 (two) times daily for 5 days.  Dispense: 10 capsule; Refill: 0 -     Urine Culture -     Celecoxib; Take 1 capsule (100 mg total) by mouth 2 (two) times daily with a meal.  Dispense: 60 capsule; Refill: 0  Cloudy urine -     Nitrofurantoin Monohyd Macro; Take 1 capsule (100 mg total) by mouth 2 (two) times daily for 5 days.  Dispense: 10 capsule; Refill: 0 -     Urine Culture  Primary osteoarthritis involving multiple joints Assessment & Plan: Pt with known OA presenting today with reproducible back pain on the R, favoring muscular cause. Due to age, muscle relaxers are not the preferred tx agent. Did okay with OTC ibuprofen. Will do short course of celebrex. Encouraged moist heat  Orders: -     Celecoxib; Take 1 capsule (100 mg total) by mouth 2 (two) times daily with a meal.  Dispense: 60 capsule; Refill: 0  Impaired cognition Assessment & Plan: Pt concerned about developing cognitive impairment, having issues with short term memory. MMSE in office today, pt scored 27/30. MoCA test also administered, pt scored 22/30 Pt has a scheduled appointment with PCP tomorrow and will keep this appointment. Will defer further workup to PCP    Pt with hx of kidney stones and kidney infections. She is concerned her current R sided back sx are related to  infection. UA in office negative, however urine malodorous and cloudy. Will send out culture for confirmation and initiate empiric antibiotics. If cx neg, will DC abx.   Suspect in part back sx to be musculoskeletal. Pt did okay with ibuprofen, therefore will call in celebrex for pt to take BID with food secondary to known OA. Will defer C spine workup to PCP.  Pt did score low on her MoCA test. Has follow up scheduled tomorrow to further address her memory concerns.  Total time spent with patient including face to face, chart review, documentation and administering test was 35 minutes.  Return in about 1 day (around 09/27/2023).   Maretta Bees, PA

## 2023-09-26 NOTE — Patient Instructions (Signed)
Please take the antibiotic as prescribed.  You will take it twice daily until gone, I would recommend only taking your evening dose today.  Please drink plenty of water to flush out your urinary system.   Please apply a microwavable heat pack, or a warm moist washcloth to the area of your back that is causing you pain. I have also prescribed a low-dose anti-inflammatory for you to try.  Take with food and do not take any additional over the counter anti-inflammatories such as ibuprofen, aleve, naproxen, etc.   We administered 2 tests today, the MMSE which she scored 27 out of 30, and a MoCA which she scored 22 out of 30. Please address these concerns with Dr. Claiborne Billings tomorrow.  Please also discuss your neck pain.

## 2023-09-26 NOTE — Assessment & Plan Note (Signed)
Pt concerned about developing cognitive impairment, having issues with short term memory. MMSE in office today, pt scored 27/30. MoCA test also administered, pt scored 22/30 Pt has a scheduled appointment with PCP tomorrow and will keep this appointment. Will defer further workup to PCP

## 2023-09-26 NOTE — Assessment & Plan Note (Signed)
Pt with known OA presenting today with reproducible back pain on the R, favoring muscular cause. Due to age, muscle relaxers are not the preferred tx agent. Did okay with OTC ibuprofen. Will do short course of celebrex. Encouraged moist heat

## 2023-09-27 ENCOUNTER — Encounter: Payer: Self-pay | Admitting: Family Medicine

## 2023-09-27 ENCOUNTER — Ambulatory Visit (INDEPENDENT_AMBULATORY_CARE_PROVIDER_SITE_OTHER): Payer: PPO | Admitting: Family Medicine

## 2023-09-27 VITALS — BP 108/72 | HR 75 | Temp 97.6°F | Wt 158.4 lb

## 2023-09-27 DIAGNOSIS — E559 Vitamin D deficiency, unspecified: Secondary | ICD-10-CM

## 2023-09-27 DIAGNOSIS — E538 Deficiency of other specified B group vitamins: Secondary | ICD-10-CM

## 2023-09-27 DIAGNOSIS — R42 Dizziness and giddiness: Secondary | ICD-10-CM

## 2023-09-27 DIAGNOSIS — H814 Vertigo of central origin: Secondary | ICD-10-CM

## 2023-09-27 DIAGNOSIS — Z820 Family history of epilepsy and other diseases of the nervous system: Secondary | ICD-10-CM | POA: Diagnosis not present

## 2023-09-27 DIAGNOSIS — R4189 Other symptoms and signs involving cognitive functions and awareness: Secondary | ICD-10-CM | POA: Diagnosis not present

## 2023-09-27 LAB — COMPREHENSIVE METABOLIC PANEL
ALT: 14 U/L (ref 0–35)
AST: 18 U/L (ref 0–37)
Albumin: 4.3 g/dL (ref 3.5–5.2)
Alkaline Phosphatase: 58 U/L (ref 39–117)
BUN: 17 mg/dL (ref 6–23)
CO2: 32 meq/L (ref 19–32)
Calcium: 9.4 mg/dL (ref 8.4–10.5)
Chloride: 102 meq/L (ref 96–112)
Creatinine, Ser: 0.73 mg/dL (ref 0.40–1.20)
GFR: 82.2 mL/min (ref >60.00)
Glucose, Bld: 73 mg/dL (ref 70–99)
Potassium: 4.5 meq/L (ref 3.5–5.1)
Sodium: 142 meq/L (ref 135–145)
Total Bilirubin: 0.6 mg/dL (ref 0.2–1.2)
Total Protein: 6.8 g/dL (ref 6.0–8.3)

## 2023-09-27 LAB — URINE CULTURE
MICRO NUMBER:: 15744417
Result:: NO GROWTH
SPECIMEN QUALITY:: ADEQUATE

## 2023-09-27 LAB — B12 AND FOLATE PANEL
Folate: >24.2 ng/mL (ref >5.9)
Vitamin B-12: >1537 pg/mL — ABNORMAL HIGH (ref 211–911)

## 2023-09-27 LAB — VITAMIN D 25 HYDROXY (VIT D DEFICIENCY, FRACTURES): VITD: 46.31 ng/mL (ref 30.00–100.00)

## 2023-09-27 LAB — TSH: TSH: 1.61 u[IU]/mL (ref 0.35–5.50)

## 2023-09-27 NOTE — Progress Notes (Signed)
Jenny Hicks , Oct 06, 1951, 72 y.o., female MRN: [id number] Patient Care Team    Relationship Specialty Notifications Start End  Natalia Leatherwood, DO PCP - General Family Medicine  09/16/16   Chrystie Nose, MD Consulting Physician Cardiology  09/16/16   Annamaria Helling, MD Consulting Physician Obstetrics and Gynecology  09/16/16   Nadara Mustard, MD Consulting Physician Orthopedic Surgery  11/28/18   Jerrell Mylar, MD Referring Physician Gynecology  01/06/21   Napoleon Form, MD Consulting Physician Gastroenterology  10/25/22     Chief Complaint  Patient presents with   cognitive changes    Onset over 6 months     Subjective: KAHLILAH EPP is a 72 y.o. Pt presents for an OV with complaints of changes in memory > 6 mos onset.  She has concerns of developing Alzheimer's disease.  Her mother had a history of Alzheimer's disease.  She has noticed a more recent change in her short-term memory and memory recall.  She gives an example of forgetting why she is performing a task and she cannot relate recent conversations and speaking to family members.   Patient has had a prior medical history of vitamin D deficiency and B12 deficiency. She is supplementing both vitamin D and B12 She reports her family has brought her cognitive changes  to her attention more recently.     In office cognitive testing 09/26/2023: MMSE: 27/30. MoCA: 22/30  She had an MRI of her brain in January along with a CT during an episode of disequilibrium. 11/2022 MRI brain without contrast MRI BRAIN WITHOUT CONTRAST  HISTORY:  Dizziness  COMPARISON: Earlier same day CT head.  TECHNIQUE: Multiplanar, multi sequence noncontrast imaging of the brain was performed.  FINDINGS:  Parenchyma:     No acute ischemia. No cortical encephalomalacia. No evidence of mass, midline shift, or edema. Mild T2/FLAIR hyperintense signal within the white matter suggestive of chronic small vessel disease. Flow voids at the  skull base visualized. No evidence of intracranial hemorrhage or extra-axial fluid collection. Basal cisterns and midline structures unremarkable. Cerebellum and brainstem appear normal.  Ventricles:     Ventricles prominent, concordant with the degree of cerebral atrophy.  Sinuses and Mastoids:     Sinuses and mastoid air cells are clear.  Additional findings:     Calvarium intact. Orbits are symmetric.  IMPRESSION:  No acute intracranial abnormality.  Mild chronic microvascular ischemic changes and mild to moderate parenchymal volume loss.   CT head without contrast 11/2022 IMPRESSION:  No CT evidence of acute intracranial abnormality. Cerebral volume loss.    She reports her neck on the left side has also been bothering her.  Popping and clicking with range of motion and neck soreness.  She has been seeing a chiropractor for this condition.  X-ray report from [facility].  This image is old from September 19, 2014.  No fractures or dislocations.  Hyperlordosis of cervical spine.  Levoscoliosis of thoracic spine.  Moderate degeneration visualized at C5, C6, L4, L5 and S1.  There is moderate disc thinning at C5-C6, T2-T3, T3-T4, and T4-T5.  T10-L1 with mild right convexity.  Mild shift of C3 on C4.     03/18/2023    8:11 AM 01/31/2023    8:19 AM 10/20/2022   10:53 AM 10/07/2021   11:52 AM 01/06/2021    1:55 PM  Depression screen PHQ 2/9  Decreased Interest 0 0 0 0 0  Down, Depressed, Hopeless  0 0 0 0 0  PHQ - 2 Score 0 0 0 0 0    Allergies  Allergen Reactions   Hydrocodone-Acetaminophen Other (See Comments)    Made her BP go way up   Pantoprazole Sodium Palpitations   Social History   Social History Narrative   Married to [place], they have 5 children.    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   Drinks caffeine, takes a daily vitamin   Wears seatbelt, smoke detector in the home, no firearms in the home.    Feels safe in her relationships.     Past Medical History:  Diagnosis Date   Abdominal pain 10/25/2022   Benign cyst of kidney    Bone spur of right foot    Chicken pox    Colitis 01/06/2021   DDD (degenerative disc disease), cervical    Diverticulitis 10/06/2020   treatment given in ER   GERD (gastroesophageal reflux disease)    Heart palpitations    History of nuclear stress test 03/2013   stress myoview; normal study; SVT (HR 200) - adenosine   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 03/2013 Echo: EF 55-60%, mild MVP, mild to mod MR.   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  Recommended continued compression for symptomatic improvement.  She can continue Advil as needed for pain.  Recommend no lifting more than 5 pounds for another 3 weeks and then gradual increase to tolerance.  She can move the wrist and the elbow to tolerance   Scoliosis    SVT (supraventricular tachycardia) (HCC)    a. 03/2013 occurred during ex MV-->resolved with adenosine and carotid massage.   Past Surgical History:  Procedure Laterality Date   COLONOSCOPY  01/12/2011   DIAGNOSTIC MAMMOGRAM  08/06/2014   digital   TRANSTHORACIC ECHOCARDIOGRAM  03/2013   EF 55-60%, mild conc hypertrophy; mild MVP, mild-mod MR   UMBILICAL HERNIA REPAIR  03/02/2021   with colprpex   VAGINAL HYSTERECTOMY  1989   partial; pt has both ovaries   Family History  Problem Relation Age of Onset   Heart attack Mother    Hypertension Mother    Stroke Mother    Breast cancer Mother    Osteoporosis Mother        cervical/femur fracture   Kidney disease Mother    Alzheimer's disease Mother    Atrial fibrillation Mother    Myasthenia gravis Father    Prostate cancer Father    Prostate cancer Brother    Early death Maternal Grandmother        died after October 04, 2023 baby   Heart disease  Maternal Grandfather    Congestive Heart Failure Maternal Grandfather    Ovarian cancer Paternal Grandmother    Other Daughter        mitral valve damaged had to be repaired, cause unknown   Arthritis Maternal Aunt    Arthritis Maternal Uncle    Prostate cancer Maternal Uncle    Breast cancer Cousin    Colon cancer Neg Hx    Esophageal cancer Neg Hx    Rectal cancer Neg Hx    Colon polyps Neg Hx    Stomach cancer Neg Hx    Allergies as of 09/27/2023  Reactions   Hydrocodone-acetaminophen Other (See Comments)   Made her BP go way up   Pantoprazole Sodium Palpitations        Medication List        Accurate as of September 27, 2023  3:37 PM. If you have any questions, ask your nurse or doctor.          CALCIUM 600 + D PO Take 600 mg by mouth daily. Vit D 800 units   celecoxib 100 MG capsule Commonly known as: CeleBREX Take 1 capsule (100 mg total) by mouth 2 (two) times daily with a meal.   CENTRUM PO Take by mouth daily.   furosemide 20 MG tablet Commonly known as: LASIX Take 1 tablet (20 mg total) by mouth daily as needed.   metoprolol succinate 25 MG 24 hr tablet Commonly known as: TOPROL-XL Take 1 tablet (25 mg total) by mouth daily.   nitrofurantoin (macrocrystal-monohydrate) 100 MG capsule Commonly known as: Macrobid Take 1 capsule (100 mg total) by mouth 2 (two) times daily for 5 days.   omeprazole 20 MG capsule Commonly known as: PRILOSEC Take 1 capsule (20 mg total) by mouth daily.   potassium chloride SA 20 MEQ tablet Commonly known as: KLOR-CON M Take 1 tablet (20 mEq total) by mouth daily as needed. With lasix use.   Vitamin D (Cholecalciferol) 25 MCG (1000 UT) Tabs Take by mouth. 2,000 units 3x a week        All past medical history, surgical history, allergies, family history, immunizations andmedications were updated in the EMR today and reviewed under the history and medication portions of their EMR.     ROS Negative, with  the exception of above mentioned in HPI  Objective:  BP 108/72   Pulse 75   Temp 97.6 F (36.4 C)   Wt 158 lb 6.4 oz (71.8 kg)   SpO2 98%   BMI 27.38 kg/m  Body mass index is 27.38 kg/m. Physical Exam Vitals and nursing note reviewed.  Constitutional:      General: She is not in acute distress.    Appearance: Normal appearance. She is not ill-appearing, toxic-appearing or diaphoretic.  HENT:     Head: Normocephalic and atraumatic.  Eyes:     General: No visual field deficit or scleral icterus.       Right eye: No discharge.        Left eye: No discharge.     Extraocular Movements: Extraocular movements intact.     Conjunctiva/sclera: Conjunctivae normal.     Pupils: Pupils are equal, round, and reactive to light.  Cardiovascular:     Rate and Rhythm: Normal rate and regular rhythm.  Pulmonary:     Effort: Pulmonary effort is normal. No respiratory distress.     Breath sounds: Normal breath sounds. No wheezing, rhonchi or rales.  Musculoskeletal:     Right lower leg: No edema.     Left lower leg: No edema.  Skin:    General: Skin is warm.     Findings: No rash.  Neurological:     Mental Status: She is alert and oriented to person, place, and time. Mental status is at baseline.     Cranial Nerves: No dysarthria or facial asymmetry.     Sensory: No sensory deficit.     Motor: No weakness or pronator drift.     Coordination: Romberg sign negative. Coordination abnormal. Finger-Nose-Finger Test abnormal and Heel to [facility] Test abnormal.     Gait: Tandem walk  abnormal. Gait normal.  Psychiatric:        Mood and Affect: Mood normal.        Behavior: Behavior normal.        Thought Content: Thought content normal.        Judgment: Judgment normal.      Assessment/Plan: SAHORY SITTERLY is a 73 y.o. female present for OV for  Cognitive decline/dizziness/neck pain/family history Alzheimer's Notable cognitive decline present with cognitive testing.  Family members have  started to notice decreased in her ability to recall facts.  Family history of Alzheimer's disease. - Vitamin D (25 hydroxy)-supplementing, history of vitamin D deficiency - B12 and Folate Panel supplementing history of B12 deficiency - TSH - Comp Met (CMET) -MRA head and neck ordered, evaluate for aneurysm versus carotid artery stenosis -MRI brain with prior stroke or demyelinating disease causing cognitive symptoms and dizziness -Consider neurology referral if indicated after results.  Reviewed expectations re: course of current medical issues. Discussed self-management of symptoms. Outlined signs and symptoms indicating need for more acute intervention. Patient verbalized understanding and all questions were answered. Patient received an After-Visit Summary.    Orders Placed This Encounter  Procedures   MR Brain W Wo Contrast   MR ANGIO HEAD WO CONTRAST   MR Angiogram Neck W Wo Contrast   Vitamin D (25 hydroxy)   B12 and Folate Panel   TSH   Comp Met (CMET)   No orders of the defined types were placed in this encounter.  Referral Orders  No referral(s) requested today   53 minutes spent during patient encounter discussing cognitive decline in detail, reviewing cognitive testing and discussing further workup, ordering labs advanced imaging studies.  Note is dictated utilizing voice recognition software. Although note has been proof read prior to signing, occasional typographical errors still can be missed. If any questions arise, please do not hesitate to call for verification.   electronically signed by:  Felix Pacini, DO  [facility] [place]

## 2023-09-27 NOTE — Patient Instructions (Signed)
Cognitive Tips  Keep a journal/notebook with sections for the following (or use sections separately as needed):  Calendar and appointment sheet, schedule for each day, lists of reminders (such as grocery lists or "to do" list), homework assignments for therapy, important information such as family and friends names / addresses / phone numbers, medications, medical history and doctors name / phone numbers.  Avoid / remove clutter and unnecessary items from areas such as countertops / cabinets in kitchen and bathroom, closets, etc.  Organize items by purpose.  Baskets and bins help with this.  Leave notes for reminders above task to be completed.  For example: Note to turn off stove over the the stove; note to lock door beside the door, not to brush teeth then wash face by sink, note to take medication on table etc.  To help recall names of people of people or things, mentally or verbally go through the alphabet to try to determine the 1st letter of the word as this may trigger the name or word you are looking for.  If this is too difficult and someone else knows the word, have them give you the first letter by asking, "does it start with an "A", "B", "C" etc. (or have them give you the first sound of the word or some other clue).  Review family events, occasions, names, etc.  Pictures are a good way to trigger memory.  Have others correct you if you answer something incorrectly.  Have them speak slowly with a few words to give you time to process and respond.  Don't let others automatically problem-solve. (For example: don't let them automatically lay out clothes in the correct position, but hand it to you folded so that you can figure it out for yourself.)  However, if you need help with tasks, they should give you as little as they can so that you can be successful.  If appropriate and safe, they may allow you to make mistakes so that you can figure out how to correct the error.  (For example, they  may allow you to put your shoes on the wrong foot to see if you notice that is wrong).  If you struggle, they should give you a cue.  (Example: "Do your shoes feel right?"  "Do they look right?") 

## 2023-09-30 DIAGNOSIS — I771 Stricture of artery: Secondary | ICD-10-CM | POA: Diagnosis not present

## 2023-09-30 DIAGNOSIS — R0789 Other chest pain: Secondary | ICD-10-CM | POA: Diagnosis not present

## 2023-10-24 ENCOUNTER — Ambulatory Visit
Admission: RE | Admit: 2023-10-24 | Discharge: 2023-10-24 | Disposition: A | Discharge status: Home / Self Care | Payer: PPO | Source: Ambulatory Visit | Attending: Family Medicine | Admitting: Family Medicine

## 2023-10-24 DIAGNOSIS — E559 Vitamin D deficiency, unspecified: Secondary | ICD-10-CM

## 2023-10-24 DIAGNOSIS — R4189 Other symptoms and signs involving cognitive functions and awareness: Secondary | ICD-10-CM

## 2023-10-24 DIAGNOSIS — R42 Dizziness and giddiness: Secondary | ICD-10-CM

## 2023-10-24 DIAGNOSIS — E538 Deficiency of other specified B group vitamins: Secondary | ICD-10-CM

## 2023-10-24 DIAGNOSIS — H814 Vertigo of central origin: Secondary | ICD-10-CM

## 2023-10-24 MED ORDER — GADOPICLENOL 0.5 MMOL/ML IV SOLN
7.0000 mL | Freq: Once | INTRAVENOUS | Status: AC | PRN
  Administered 2023-10-24: 7 mL via INTRAVENOUS

## 2023-10-26 ENCOUNTER — Ambulatory Visit: Payer: PPO | Admitting: *Deleted

## 2023-10-26 DIAGNOSIS — Z0000 Encounter for general adult medical examination without abnormal findings: Secondary | ICD-10-CM

## 2023-10-26 NOTE — Progress Notes (Signed)
Subjective:   Jenny Hicks is a 72 y.o. female who presents for Medicare Annual (Subsequent) preventive examination.  Visit Complete: Virtual I connected with  ABRAR DELAHANTY on 10/26/23 by a audio enabled telemedicine application and verified that I am speaking with the correct person using two identifiers.  Patient Location: Home  Provider Location: Home Office  I discussed the limitations of evaluation and management by telemedicine. The patient expressed understanding and agreed to proceed.  Vital Signs: Because this visit was a virtual/telehealth visit, some criteria may be missing or patient reported. Any vitals not documented were not able to be obtained and vitals that have been documented are patient reported.  Cardiac Risk Factors include: advanced age (>43men, >25 women)     Objective:    Today's Vitals   10/26/23 0813  PainSc: 3    There is no height or weight on file to calculate BMI.     10/26/2023    8:26 AM 10/20/2022   11:30 AM 10/07/2021   11:53 AM 08/27/2020    3:42 PM 11/28/2018    3:06 PM  Advanced Directives  Does Patient Have a Medical Advance Directive? No No Yes No No  Does patient want to make changes to medical advance directive?   Yes (MAU/Ambulatory/Procedural Areas - Information given)    Would patient like information on creating a medical advance directive? No - Patient declined No - Patient declined  No - Patient declined Yes (MAU/Ambulatory/Procedural Areas - Information given)    Current Medications (verified) Outpatient Encounter Medications as of 10/26/2023  Medication Sig   Calcium Carb-Cholecalciferol (CALCIUM 600 + D PO) Take 600 mg by mouth daily. Vit D 800 units   Cobalamin Combinations (B-12) 1000-400 MCG SUBL Place under the tongue once. Takes dropper once a day   metoprolol succinate (TOPROL-XL) 25 MG 24 hr tablet Take 1 tablet (25 mg total) by mouth daily.   Multiple Vitamins-Minerals (CENTRUM PO) Take by mouth daily.    Vitamin D, Cholecalciferol, 25 MCG (1000 UT) TABS Take by mouth. 2,000 units 3x a week   celecoxib (CELEBREX) 100 MG capsule Take 1 capsule (100 mg total) by mouth 2 (two) times daily with a meal. (Patient not taking: Reported on 10/26/2023)   furosemide (LASIX) 20 MG tablet Take 1 tablet (20 mg total) by mouth daily as needed. (Patient not taking: Reported on 10/26/2023)   omeprazole (PRILOSEC) 20 MG capsule Take 1 capsule (20 mg total) by mouth daily. (Patient not taking: Reported on 10/26/2023)   potassium chloride SA (KLOR-CON M) 20 MEQ tablet Take 1 tablet (20 mEq total) by mouth daily as needed. With lasix use. (Patient not taking: Reported on 10/26/2023)   No facility-administered encounter medications on file as of 10/26/2023.    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 03/2013 Echo: EF 55-60%, mild MVP, mild to mod MR.   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  Recommended continued compression for symptomatic improvement.  She can continue Advil as needed for pain.  Recommend no lifting more than 5 pounds for another 3 weeks and then gradual increase to tolerance.  She can move the wrist and the elbow to tolerance   Scoliosis    SVT (supraventricular tachycardia) (HCC)    a. 03/2013 occurred during ex  MV-->resolved with adenosine and carotid massage.   Past Surgical History:  Procedure Laterality Date   COLONOSCOPY  01/12/2011   DIAGNOSTIC MAMMOGRAM  08/06/2014   digital   TRANSTHORACIC ECHOCARDIOGRAM  03/2013   EF 55-60%, mild conc hypertrophy; mild MVP, mild-mod MR   UMBILICAL HERNIA REPAIR  03/02/2021   with colprpex   VAGINAL HYSTERECTOMY  1989   partial; pt has both ovaries   Family History  Problem Relation Age of Onset   Heart attack Mother    Hypertension Mother    Stroke Mother    Breast cancer Mother    Osteoporosis Mother        cervical/femur fracture   Kidney disease Mother    Alzheimer's disease Mother    Atrial fibrillation Mother    Myasthenia gravis Father    Prostate cancer Father    Prostate cancer Brother    Early death Maternal Grandmother        died after 11/12/23 baby   Heart disease Maternal Grandfather    Congestive Heart Failure Maternal Grandfather    Ovarian cancer Paternal Grandmother    Other Daughter        mitral valve damaged had to be repaired, cause unknown   Arthritis Maternal Aunt    Arthritis Maternal Uncle    Prostate cancer Maternal Uncle    Breast cancer Cousin    Colon cancer Neg Hx    Esophageal cancer Neg Hx    Rectal cancer Neg Hx    Colon polyps Neg Hx    Stomach cancer Neg Hx    Social History   Socioeconomic History   Marital status: Married    Spouse name: [name]   Number of children: 5   Years of education: 16   Highest education level: Bachelor's degree (e.g., BA, AB, BS)  Occupational History   Occupation: retired Charity fundraiser  Tobacco Use   Smoking status: Never   Smokeless tobacco: Never  Vaping Use   Vaping status: Never Used  Substance and Sexual Activity   Alcohol use: No   Drug use: No   Sexual activity: Yes    Partners: Male    Comment: married  Other Topics Concern   Not on file  Social History Narrative   Married to Segundo, they have 5 children.    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   Drinks caffeine, takes a daily vitamin   Wears seatbelt, smoke detector in the home, no firearms in the home.    Feels safe in her relationships.    Social Drivers of Corporate investment banker Strain: Low Risk  (10/26/2023)   Overall Financial Resource Strain (CARDIA)    Difficulty of Paying Living Expenses: Not hard at all  Food Insecurity: No Food Insecurity (10/26/2023)   Hunger Vital Sign    Worried About Running Out of Food in the Last Year: Never true  Ran Out of Food in the Last Year: Never true  Transportation Needs: No Transportation Needs (10/26/2023)   PRAPARE - Administrator, Civil Service (Medical): No    Lack of Transportation (Non-Medical): No  Physical Activity: Insufficiently Active (10/26/2023)   Exercise Vital Sign    Days of Exercise per Week: 2 days    Minutes of Exercise per Session: 20 min  Stress: No Stress Concern Present (10/26/2023)   [employer] of Occupational Health - Occupational Stress Questionnaire    Feeling of Stress : Not at all  Social Connections: Moderately Isolated (10/26/2023)   Social Connection and Isolation Panel [NHANES]    Frequency of Communication with Friends and Family: More than three times a week    Frequency of Social Gatherings with Friends and Family: More than three times a week    Attends Religious Services: Never    Database administrator or Organizations: No    Attends Engineer, structural: Never    Marital Status: Married    Tobacco Counseling Counseling given: Not Answered   Clinical Intake:  Pre-visit preparation completed: Yes  Pain : 0-10 Pain Score: 3  Pain Type: Chronic pain Pain Location: Neck Pain Descriptors / Indicators: Burning, Aching, Dull Pain Onset: More than a month ago Pain Frequency: Intermittent     Diabetes: No  How often do you need to have someone help you when you read instructions, pamphlets, or other written materials from your  doctor or pharmacy?: 1 - Never  Interpreter Needed?: No  Information entered by :: Remi Haggard LPN   Activities of Daily Living    10/26/2023    8:27 AM  In your present state of health, do you have any difficulty performing the following activities:  Hearing? 0  Vision? 0  Difficulty concentrating or making decisions? 0  Walking or climbing stairs? 0  Dressing or bathing? 0  Doing errands, shopping? 0  Preparing Food and eating ? N  Using the Toilet? N  In the past six months, have you accidently leaked urine? N  Do you have problems with loss of bowel control? N  Managing your Medications? N  Managing your Finances? N  Housekeeping or managing your Housekeeping? N    Patient Care Team: Natalia Leatherwood, DO as PCP - General (Family Medicine) Rennis Golden Lisette Abu, MD as Consulting Physician (Cardiology) Annamaria Helling, MD as Consulting Physician (Obstetrics and Gynecology) Nadara Mustard, MD as Consulting Physician (Orthopedic Surgery) Jerrell Mylar, MD as Referring Physician (Gynecology) Napoleon Form, MD as Consulting Physician (Gastroenterology)  Indicate any recent Medical Services you may have received from other than [facility] providers in the past year (date may be approximate).     Assessment:   This is a routine wellness examination for Jenny Hicks.  Hearing/Vision screen Hearing Screening - Comments:: No trouble hearing Vision Screening - Comments:: Up to date [facility]   Goals Addressed             This Visit's Progress    Patient Stated   On track    Stay active      Patient Stated       Stay active     Quality of Life Maintained   On track    Walk more       Depression Screen    10/26/2023    8:34 AM 03/18/2023    8:11 AM 01/31/2023    8:19 AM 10/20/2022   10:53 AM 10/07/2021  11:52 AM 01/06/2021    1:55 PM 08/27/2020    3:44 PM  PHQ 2/9 Scores  PHQ - 2 Score 0 0 0 0 0 0 0  PHQ- 9 Score 0          Fall Risk    10/26/2023    8:19 AM  03/18/2023    8:11 AM 01/31/2023    8:19 AM 10/20/2022   11:30 AM 04/20/2022   12:14 PM  Fall Risk   Falls in the past year? 0 0 0 0 0  Number falls in past yr: 0 0 0 0   Injury with Fall? 0 0 0 0   Follow up Falls evaluation completed;Education provided;Falls prevention discussed Falls evaluation completed Falls evaluation completed      MEDICARE RISK AT HOME: Medicare Risk at Home Any stairs in or around the home?: No If so, are there any without handrails?: No Home free of loose throw rugs in walkways, pet beds, electrical cords, etc?: Yes Adequate lighting in your home to reduce risk of falls?: Yes Life alert?: No Use of a cane, walker or w/c?: No Grab bars in the bathroom?: Yes Shower chair or bench in shower?: Yes Elevated toilet seat or a handicapped toilet?: Yes  TIMED UP AND GO:  Was the test performed?  No    Cognitive Function:    11/28/2018    3:08 PM  MMSE - Mini Mental State Exam  Orientation to time 5  Orientation to Place 5  Registration 3  Attention/ Calculation 5  Recall 2  Language- name 2 objects 2  Language- repeat 1  Language- follow 3 step command 3  Language- read & follow direction 1  Write a sentence 1  Copy design 1  Total score 29        10/26/2023    8:29 AM 10/07/2021   11:55 AM  6CIT Screen  What Year? 0 points 0 points  What month? 0 points 0 points  What time? 0 points 0 points  Count back from 20 2 points 0 points  Months in reverse 0 points 0 points  Repeat phrase 10 points 0 points  Total Score 12 points 0 points    Immunizations Immunization History  Administered Date(s) Administered   Influenza Split 08/26/2011, 08/28/2012   Influenza, High Dose Seasonal PF 07/18/2018, 09/03/2019   Influenza-Unspecified 08/24/2014, 09/01/2015, 07/25/2016, 08/22/2017, 09/03/2019, 09/09/2020, 08/19/2021, 08/19/2022   PFIZER(Purple Top)SARS-COV-2 Vaccination 12/17/2019, 01/09/2020, 08/22/2020, 08/12/2021, 08/26/2023   Pneumococcal  Conjugate-13 10/08/2016   Pneumococcal Polysaccharide-23 03/15/2013, 12/15/2017   Td 12/15/2017   Tdap 11/09/2007   Zoster Recombinant(Shingrix) 01/22/2021, 05/28/2021   Zoster, Live 11/06/2011    TDAP status: Up to date  Flu Vaccine status: Up to date  Pneumococcal vaccine status: Up to date  Covid-19 vaccine status: Completed vaccines  Qualifies for Shingles Vaccine? No   Zostavax completed Yes   Shingrix Completed?: Yes  Screening Tests Health Maintenance  Topic Date Due   Colonoscopy  04/16/2024   Medicare Annual Wellness (AWV)  10/25/2024   MAMMOGRAM  01/30/2025   DEXA SCAN  01/30/2025   DTaP/Tdap/Td (3 - Td or Tdap) 12/16/2027   Pneumonia Vaccine 69+ Years old  Completed   INFLUENZA VACCINE  Completed   Hepatitis C Screening  Completed   Zoster Vaccines- Shingrix  Completed   HPV VACCINES  Aged Out   COVID-19 Vaccine  Discontinued    Health Maintenance  There are no preventive care reminders to display for this patient.  Colorectal cancer screening: Type of screening: Colonoscopy. Completed 2022. Repeat every 3 years  Mammogram status: Completed  . Repeat every year  Bone Density status: Completed 2024. Results reflect: Bone density results: OSTEOPOROSIS. Repeat every 2 years.  Lung Cancer Screening: (Low Dose CT Chest recommended if Age 6-80 years, 20 pack-year currently smoking OR have quit w/in 15years.) does not qualify.   Lung Cancer Screening Referral:   Additional Screening:  Hepatitis C Screening: does not qualify; Completed 2017  Vision Screening: Recommended annual ophthalmology exams for early detection of glaucoma and other disorders of the eye. Is the patient up to date with their annual eye exam?  Yes  Who is the provider or what is the name of the office in which the patient attends annual eye exams? [facility] If pt is not established with a provider, would they like to be referred to a provider to establish care? No .   Dental Screening:  Recommended annual dental exams for proper oral hygiene    Community Resource Referral / Chronic Care Management: CRR required this visit?  No   CCM required this visit?  No     Plan:     I have personally reviewed and noted the following in the patient's chart:   Medical and social history Use of alcohol, tobacco or illicit drugs  Current medications and supplements including opioid prescriptions. Patient is not currently taking opioid prescriptions. Functional ability and status Nutritional status Physical activity Advanced directives List of other physicians Hospitalizations, surgeries, and ER visits in previous 12 months Vitals Screenings to include cognitive, depression, and falls Referrals and appointments  In addition, I have reviewed and discussed with patient certain preventive protocols, quality metrics, and best practice recommendations. A written personalized care plan for preventive services as well as general preventive health recommendations were provided to patient.     Remi Haggard, LPN   [id number]   After Visit Summary: (MyChart) Due to this being a telephonic visit, the after visit summary with patients personalized plan was offered to patient via MyChart   Nurse Notes:

## 2023-10-26 NOTE — Patient Instructions (Signed)
Jenny Hicks , Thank you for taking time to come for your Medicare Wellness Visit. I appreciate your ongoing commitment to your health goals. Please review the following plan we discussed and let me know if I can assist you in the future.   Screening recommendations/referrals: Colonoscopy: up to date Mammogram: up to date Bone Density: up to date Recommended yearly ophthalmology/optometry visit for glaucoma screening and checkup Recommended yearly dental visit for hygiene and checkup  Vaccinations: Influenza vaccine: up to date Pneumococcal vaccine: up to date Tdap vaccine: up to date Shingles vaccine: up to date    Advanced directives: Education provided   Preventive Care 65 Years and Older, Female Preventive care refers to lifestyle choices and visits with your health care provider that can promote health and wellness. What does preventive care include? A yearly physical exam. This is also called an annual well check. Dental exams once or twice a year. Routine eye exams. Ask your health care provider how often you should have your eyes checked. Personal lifestyle choices, including: Daily care of your teeth and gums. Regular physical activity. Eating a healthy diet. Avoiding tobacco and drug use. Limiting alcohol use. Practicing safe sex. Taking low-dose aspirin every day. Taking vitamin and mineral supplements as recommended by your health care provider. What happens during an annual well check? The services and screenings done by your health care provider during your annual well check will depend on your age, overall health, lifestyle risk factors, and family history of disease. Counseling  Your health care provider may ask you questions about your: Alcohol use. Tobacco use. Drug use. Emotional well-being. Home and relationship well-being. Sexual activity. Eating habits. History of falls. Memory and ability to understand (cognition). Work and work  Astronomer. Reproductive health. Screening  You may have the following tests or measurements: Height, weight, and BMI. Blood pressure. Lipid and cholesterol levels. These may be checked every 5 years, or more frequently if you are over 28 years old. Skin check. Lung cancer screening. You may have this screening every year starting at age 23 if you have a 30-pack-year history of smoking and currently smoke or have quit within the past 15 years. Fecal occult blood test (FOBT) of the stool. You may have this test every year starting at age 11. Flexible sigmoidoscopy or colonoscopy. You may have a sigmoidoscopy every 5 years or a colonoscopy every 10 years starting at age 53. Hepatitis C blood test. Hepatitis B blood test. Sexually transmitted disease (STD) testing. Diabetes screening. This is done by checking your blood sugar (glucose) after you have not eaten for a while (fasting). You may have this done every 1-3 years. Bone density scan. This is done to screen for osteoporosis. You may have this done starting at age 60. Mammogram. This may be done every 1-2 years. Talk to your health care provider about how often you should have regular mammograms. Talk with your health care provider about your test results, treatment options, and if necessary, the need for more tests. Vaccines  Your health care provider may recommend certain vaccines, such as: Influenza vaccine. This is recommended every year. Tetanus, diphtheria, and acellular pertussis (Tdap, Td) vaccine. You may need a Td booster every 10 years. Zoster vaccine. You may need this after age 75. Pneumococcal 13-valent conjugate (PCV13) vaccine. One dose is recommended after age 20. Pneumococcal polysaccharide (PPSV23) vaccine. One dose is recommended after age 51. Talk to your health care provider about which screenings and vaccines you need and how often  you need them. This information is not intended to replace advice given to you by  your health care provider. Make sure you discuss any questions you have with your health care provider. Document Released: 11/21/2015 Document Revised: 07/14/2016 Document Reviewed: 08/26/2015 Elsevier Interactive Patient Education  2017 [employer].  Fall Prevention in the Home Falls can cause injuries. They can happen to people of all ages. There are many things you can do to make your home safe and to help prevent falls. What can I do on the outside of my home? Regularly fix the edges of walkways and driveways and fix any cracks. Remove anything that might make you trip as you walk through a door, such as a raised step or threshold. Trim any bushes or trees on the path to your home. Use bright outdoor lighting. Clear any walking paths of anything that might make someone trip, such as rocks or tools. Regularly check to see if handrails are loose or broken. Make sure that both sides of any steps have handrails. Any raised decks and porches should have guardrails on the edges. Have any leaves, snow, or ice cleared regularly. Use sand or salt on walking paths during winter. Clean up any spills in your garage right away. This includes oil or grease spills. What can I do in the bathroom? Use night lights. Install grab bars by the toilet and in the tub and shower. Do not use towel bars as grab bars. Use non-skid mats or decals in the tub or shower. If you need to sit down in the shower, use a plastic, non-slip stool. Keep the floor dry. Clean up any water that spills on the floor as soon as it happens. Remove soap buildup in the tub or shower regularly. Attach bath mats securely with double-sided non-slip rug tape. Do not have throw rugs and other things on the floor that can make you trip. What can I do in the bedroom? Use night lights. Make sure that you have a light by your bed that is easy to reach. Do not use any sheets or blankets that are too big for your bed. They should not hang  down onto the floor. Have a firm chair that has side arms. You can use this for support while you get dressed. Do not have throw rugs and other things on the floor that can make you trip. What can I do in the kitchen? Clean up any spills right away. Avoid walking on wet floors. Keep items that you use a lot in easy-to-reach places. If you need to reach something above you, use a strong step stool that has a grab bar. Keep electrical cords out of the way. Do not use floor polish or wax that makes floors slippery. If you must use wax, use non-skid floor wax. Do not have throw rugs and other things on the floor that can make you trip. What can I do with my stairs? Do not leave any items on the stairs. Make sure that there are handrails on both sides of the stairs and use them. Fix handrails that are broken or loose. Make sure that handrails are as long as the stairways. Check any carpeting to make sure that it is firmly attached to the stairs. Fix any carpet that is loose or worn. Avoid having throw rugs at the top or bottom of the stairs. If you do have throw rugs, attach them to the floor with carpet tape. Make sure that you have a light  switch at the top of the stairs and the bottom of the stairs. If you do not have them, ask someone to add them for you. What else can I do to help prevent falls? Wear shoes that: Do not have high heels. Have rubber bottoms. Are comfortable and fit you well. Are closed at the toe. Do not wear sandals. If you use a stepladder: Make sure that it is fully opened. Do not climb a closed stepladder. Make sure that both sides of the stepladder are locked into place. Ask someone to hold it for you, if possible. Clearly mark and make sure that you can see: Any grab bars or handrails. First and last steps. Where the edge of each step is. Use tools that help you move around (mobility aids) if they are needed. These  include: Canes. Walkers. Scooters. Crutches. Turn on the lights when you go into a dark area. Replace any light bulbs as soon as they burn out. Set up your furniture so you have a clear path. Avoid moving your furniture around. If any of your floors are uneven, fix them. If there are any pets around you, be aware of where they are. Review your medicines with your doctor. Some medicines can make you feel dizzy. This can increase your chance of falling. Ask your doctor what other things that you can do to help prevent falls. This information is not intended to replace advice given to you by your health care provider. Make sure you discuss any questions you have with your health care provider. Document Released: 08/21/2009 Document Revised: 04/01/2016 Document Reviewed: 11/29/2014 Elsevier Interactive Patient Education  2017 [employer].

## 2023-11-01 ENCOUNTER — Telehealth: Payer: Self-pay | Admitting: Family Medicine

## 2023-11-01 ENCOUNTER — Encounter: Payer: Self-pay | Admitting: Physician Assistant

## 2023-11-01 DIAGNOSIS — Z820 Family history of epilepsy and other diseases of the nervous system: Secondary | ICD-10-CM

## 2023-11-01 DIAGNOSIS — R4189 Other symptoms and signs involving cognitive functions and awareness: Secondary | ICD-10-CM

## 2023-11-01 NOTE — Telephone Encounter (Signed)
Spoke with patient regarding results/recommendations.  

## 2023-11-01 NOTE — Telephone Encounter (Signed)
Please call patient: Her MRI/MRA results are completed and all results were reassuring overall. There was no acute or new findings, such as evidence of a stroke or mass etc. The vessels of her head and neck do not have any concerning blockages or narrowing.  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  This is a very common finding for 72 year old female, but can also be associated with cognitive decline/decrease in memory have more advanced stages  I have placed a referral to neurology for her to further evaluate and discuss options on her declining memory.

## 2023-11-19 NOTE — Progress Notes (Signed)
 Assessment/Plan:   Jenny Hicks is a very pleasant 73 y.o. year old RH female with a history of hypertension, hyperlipidemia, SVT/Lown Dane Dan syndrome, DDD, GERD, seen today for evaluation of memory loss. MoCA today is 23/30.  Etiology of memory concerns is unclear at this time.  MRI of the brain personally reviewed was remarkable for mild chronic microvascular changes without acute findings, or atrophy, with mild age related volume loss.  Patient is able to participate on his IADLs and continues to drive without significant difficulties.  However, there is family history of Alzheimer's disease her mother.  Discussed proceeding with neurocognitive testing for diagnostic clarity, pending on the results if neurodegenerative disease is suspected, we will proceed with other testing, patient agreed.  In the interim, discussed initiating memantine  (given her cardiac arrhythmia history) and she is interested.  Memory impairment of unclear etiology  Neurocognitive testing to further evaluate cognitive concerns and determine other underlying cause of memory changes, including potential contribution from sleep, anxiety, attention, or depression   Start Memantine  5 mg: Take 1 tablet (5 mg at night), may consider increasing it to twice daily after neuropsych evaluation if dictated. Continue to control mood as per PCP. Recommend good control of cardiovascular risk factors Continue vitamin D  and B12 replenishment [place] up in 4 months  Subjective:   The patient is accompanied by her husband  who supplements the history.   How long did patient have memory difficulties? For the last 2 years, worse over the last few months.  Reports some difficulty remembering new information, conversations and names. May forget in mid-sentence what I was going to say. Long-term memory may be affected, but not sure.  repeats oneself?  Endorsed by her husband Disoriented when walking into a room? Denies. Patient  denies except occasionally not remembering what patient came to the room for.  Leaving objects in unusual places? Denies.   Wandering behavior?  denies .  Any personality changes?   Sometimes I am feeling more reserved.  Any history of depression?:  Denies   Hallucinations or paranoia?  Denies   Seizures?  Denies    Any sleep changes?   Sleeps well. Denies vivid dreams, REM behavior or sleepwalking   Sleep apnea?  Denies   Any hygiene concerns?  Denies   Independent of bathing and dressing?  Endorsed  Does the patient needs help with medications? Patient is in charge   Who is in charge of the finances? Patient is in charge.     Any changes in appetite?  Not eating as much as before. Does not drink enough water.      Patient have trouble swallowing? Denies.   Does the patient cook? Yes,  he had trouble remembering common recipes  Any kitchen accidents such as leaving the stove on? Denies.   Any history of headaches? 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 Chronic pain ?  Has hand arthritis, takes tylenol so she can play the piano and violin. She also has issues with plantar fascitis.  Ambulates with difficulty?  If plantar fascitis flare she can have some issues with walking, at this point well-controlled.  Recent falls or head injuries? Denies. 2 years ago she sustained a mechanical fall with elbow fracture.  No head injury or LOC Vision changes? Beginning of cataracts.  Unilateral weakness, numbness or tingling? Denies.   Any tremors?   Denies.   Any anosmia?  Denies.   Any incontinence of urine? Denies.  Prior bladder and rectal prolapse, s/p repair.  Any bowel dysfunction?  As above, the patient has a history of rectal prolapse and may need revision with GI Patient lives with husband.     History of heavy alcohol intake? Denies.   History of heavy tobacco use? Denies.   Family history of dementia? Mother AD .   Does patient drive? Yes, not much due to chronic  neck issues, limited ROM L    Recent Labs 09/2023 TSH 1.61, B12 1537. 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   Past Medical History:  Diagnosis Date   Abdominal pain 10/25/2022   Benign cyst of kidney    Bone spur of right foot    Chicken pox    Colitis 01/06/2021   DDD (degenerative disc disease), cervical    Diverticulitis 10/06/2020   treatment given in ER   GERD (gastroesophageal reflux disease)    Heart palpitations    History of nuclear stress test 03/2013   stress myoview ; normal study; SVT (HR 200) - adenosine   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 03/2013 Echo: EF 55-60%, mild MVP, mild to mod MR.   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  Recommended continued compression for symptomatic improvement.  She can continue Advil as needed for pain.  Recommend no lifting more than 5 pounds for another 3 weeks and then gradual increase to tolerance.  She can move the wrist and the elbow to tolerance   Scoliosis    SVT (supraventricular tachycardia) (HCC)    a. 03/2013 occurred during ex MV-->resolved with adenosine and carotid massage.     Past Surgical History:  Procedure Laterality Date   COLONOSCOPY  01/12/2011   DIAGNOSTIC MAMMOGRAM  08/06/2014   digital   TRANSTHORACIC ECHOCARDIOGRAM  03/2013   EF 55-60%, mild conc hypertrophy; mild MVP, mild-mod MR   UMBILICAL HERNIA REPAIR  03/02/2021   with colprpex   VAGINAL HYSTERECTOMY  1989   partial; pt has both ovaries     Allergies  Allergen Reactions   Hydrocodone-Acetaminophen Other (See Comments)    Made her BP go way up   Pantoprazole  Sodium Palpitations    Current Outpatient  Medications  Medication Instructions   Calcium Carb-Cholecalciferol (CALCIUM 600 + D PO) 600 mg, Daily   celecoxib  (CELEBREX ) 100 mg, Oral, 2 times daily with meals   Cobalamin Combinations (B-12) 1000-400 MCG SUBL  Once   furosemide  (LASIX ) 20 mg, Oral, Daily PRN   memantine  (NAMENDA ) 5 mg, Oral, Nightly   metoprolol  succinate (TOPROL -XL) 25 mg, Oral, Daily   Multiple Vitamins-Minerals (CENTRUM PO) Daily   omeprazole  (PRILOSEC) 20 mg, Oral, Daily   potassium chloride  SA (KLOR-CON  M) 20 MEQ tablet 20 mEq, Oral, Daily PRN, With lasix  use.   Vitamin D , Cholecalciferol, 25 MCG (1000 UT) TABS Take by mouth. 2,000 units 3x a week     VITALS:   Vitals:   11/21/23 0755  BP: 130/65  Pulse: 79  Resp: 18  SpO2: 96%  Weight: 159 lb (72.1 kg)  Height: 5' 4 (1.626 m)      PHYSICAL EXAM   HEENT:  Normocephalic, atraumatic.  The superficial temporal arteries are without ropiness or tenderness. Cardiovascular: Regular rate and rhythm. Lungs: Clear to auscultation bilaterally. Neck: There are no carotid bruits noted bilaterally.  NEUROLOGICAL:    11/21/2023   11:00 AM  Montreal Cognitive Assessment   Visuospatial/ Executive (0/5) 4  Naming (0/3) 3  Attention: Read list of digits (0/2) 2  Attention: Read list of letters (0/1) 1  Attention: Serial 7 subtraction starting at 100 (0/3) 2  Language: Repeat phrase (0/2) 2  Language : Fluency (0/1) 1  Abstraction (0/2) 0  Delayed Recall (0/5) 2  Orientation (0/6) 6  Total 23  Adjusted Score (based on education) 23       11/28/2018    3:08 PM  MMSE - Mini Mental State Exam  Orientation to time 5  Orientation to Place 5  Registration 3  Attention/ Calculation 5  Recall 2  Language- name 2 objects 2  Language- repeat 1  Language- follow 3 step command 3  Language- read & follow direction 1  Write a sentence 1  Copy design 1  Total score 29     Orientation:  Alert and oriented to person, place and time. No aphasia or  dysarthria. Fund of knowledge is appropriate. Recent memory impaired and remote memory intact.  Attention and concentration are normal.  Able to name objects and repeat phrases. Delayed recall 2/5 Cranial nerves: There is good facial symmetry. Extraocular muscles are intact and visual fields are full to confrontational testing. Speech is fluent and clear. No tongue deviation. Hearing is intact to conversational tone. Tone: Tone is good throughout. Sensation: Sensation is intact to light touch. Vibration is intact at the bilateral big toe.  Coordination: The patient has no difficulty with RAM's or FNF bilaterally. Normal finger to nose  Motor: Strength is 5/5 in the bilateral upper and lower extremities. There is no pronator drift. There are no fasciculations noted. DTR's: Deep tendon reflexes are 2/4 bilaterally. Gait and Station: The patient is able to ambulate without difficulty The patient is able to heel toe walk . Gait is cautious and narrow. The patient is able to ambulate in a tandem fashion.       Thank you for allowing us  the opportunity to participate in the care of this nice patient. Please do not hesitate to contact us  for any questions or concerns.   Total time spent on today's visit was 62 minutes dedicated to this patient today, preparing to see patient, examining the patient, ordering tests and/or medications and counseling the patient, documenting clinical information in the EHR or other health record, independently interpreting results and communicating results to the patient/family, discussing treatment and goals, answering patient's questions and coordinating care.  Cc:  Catherine Charlies DELENA ROSALEA [facility]ie [facility] 11/21/2023 11:51 AM

## 2023-11-21 ENCOUNTER — Encounter: Payer: Self-pay | Admitting: Physician Assistant

## 2023-11-21 ENCOUNTER — Ambulatory Visit: Payer: PPO | Admitting: Physician Assistant

## 2023-11-21 VITALS — BP 130/65 | HR 79 | Resp 18 | Ht 64.0 in | Wt 159.0 lb

## 2023-11-21 DIAGNOSIS — R413 Other amnesia: Secondary | ICD-10-CM

## 2023-11-21 MED ORDER — MEMANTINE HCL 5 MG PO TABS: 5.0000 mg | ORAL_TABLET | Freq: Every evening | ORAL | 11 refills | Status: DC

## 2023-11-21 NOTE — Patient Instructions (Signed)
 It was a pleasure to see you today at our office.   Recommendations:  Neurocognitive evaluation at our office   Follow up in  4 months  Start Memantine  5mg  tablets nightly    For psychiatric meds, mood meds: Please have your primary care physician manage these medications.  If you have any severe symptoms of a stroke, or other severe issues such as confusion,severe chills or fever, etc call 911 or go to the ER as you may need to be evaluated further   For guidance regarding [facility] and if placement were needed at the facility, contact Social Worker tel: [phone]  For assessment of decision of mental capacity and competency:  Call Dr. Rosaline Nine, geriatric psychiatrist at [phone]  Counseling regarding caregiver distress, including caregiver depression, anxiety and issues regarding community resources, adult day care programs, adult living facilities, or memory care questions:  please contact your  Primary Doctor's Social Worker   Whom to call: Memory  decline, memory medications: Call our office [phone]    [email or web address]   RECOMMENDATIONS FOR ALL PATIENTS WITH MEMORY PROBLEMS: 1. Continue to exercise (Recommend 30 minutes of walking everyday, or 3 hours every week) 2. Increase social interactions - continue going to [place] and enjoy social gatherings with friends and family 3. Eat healthy, avoid fried foods and eat more fruits and vegetables 4. Maintain adequate blood pressure, blood sugar, and blood cholesterol level. Reducing the risk of stroke and cardiovascular disease also helps promoting better memory. 5. Avoid stressful situations. Live a simple life and avoid aggravations. Organize your time and prepare for the next day in anticipation. 6. Sleep well, avoid any interruptions of sleep and avoid any distractions in the bedroom that may interfere with adequate sleep quality 7. Avoid  sugar, avoid sweets as there is a strong link between excessive sugar intake, diabetes, and cognitive impairment We discussed the Mediterranean diet, which has been shown to help patients reduce the risk of progressive memory disorders and reduces cardiovascular risk. This includes eating fish, eat fruits and green leafy vegetables, nuts like almonds and hazelnuts, walnuts, and also use olive oil. Avoid fast foods and fried foods as much as possible. Avoid sweets and sugar as sugar use has been linked to worsening of memory function.  There is always a concern of gradual progression of memory problems. If this is the case, then we may need to adjust level of care according to patient needs. Support, both to the patient and caregiver, should then be put into place.      You have been referred for a neuropsychological evaluation (i.e., evaluation of memory and thinking abilities). Please bring someone with you to this appointment if possible, as it is helpful for the doctor to hear from both you and another adult who knows you well. Please bring eyeglasses and hearing aids if you wear them.    The evaluation will take approximately 3 hours and has two parts:   The first part is a clinical interview with the neuropsychologist (Dr. Richie or Dr. Jackquline). During the interview, the neuropsychologist will speak with you and the individual you brought to the appointment.    The second part of the evaluation is testing with the doctor's technician Neal or Luke). During the testing, the technician will ask you to remember different types of material, solve problems, and answer some questionnaires. Your family member will not be present for this portion of the evaluation.   Please note: We must reserve several  hours of the neuropsychologist's time and the psychometrician's time for your evaluation appointment. As such, there is a No-Show fee of $100. If you are unable to attend any of your appointments, please  contact our office as soon as possible to reschedule.      DRIVING: Regarding driving, in patients with progressive memory problems, driving will be impaired. We advise to have someone else do the driving if trouble finding directions or if minor accidents are reported. Independent driving assessment is available to determine safety of driving.   If you are interested in the driving assessment, you can contact the following:  [facility] [employer] [facility] in [place] [phone]  [facility] [phone]  [facility] [phone]  [facility] [phone] or [phone]   FALL PRECAUTIONS: Be cautious when walking. Scan the area for obstacles that may increase the risk of trips and falls. When getting up in the mornings, sit up at the edge of the bed for a few minutes before getting out of bed. Consider elevating the bed at the head end to avoid drop of blood pressure when getting up. Walk always in a well-lit room (use night lights in the walls). Avoid area rugs or power cords from appliances in the middle of the walkways. Use a walker or a cane if necessary and consider physical therapy for balance exercise. Get your eyesight checked regularly.  FINANCIAL OVERSIGHT: Supervision, especially oversight when making financial decisions or transactions is also recommended.  HOME SAFETY: Consider the safety of the kitchen when operating appliances like stoves, microwave oven, and blender. Consider having supervision and share cooking responsibilities until no longer able to participate in those. Accidents with firearms and other hazards in the house should be identified and addressed as well.   ABILITY TO BE LEFT ALONE: If patient is unable to contact 911 operator, consider using LifeLine, or when the need is there, arrange for someone to stay with patients. Smoking is a fire hazard, consider supervision or cessation. Risk of wandering should be assessed by  caregiver and if detected at any point, supervision and safe proof recommendations should be instituted.  MEDICATION SUPERVISION: Inability to self-administer medication needs to be constantly addressed. Implement a mechanism to ensure safe administration of the medications.      Mediterranean Diet A Mediterranean diet refers to food and lifestyle choices that are based on the traditions of countries located on the [place]. This way of eating has been shown to help prevent certain conditions and improve outcomes for people who have chronic diseases, like kidney disease and heart disease. What are tips for following this plan? Lifestyle  Cook and eat meals together with your family, when possible. Drink enough fluid to keep your urine clear or pale yellow. Be physically active every day. This includes: Aerobic exercise like running or swimming. Leisure activities like gardening, walking, or housework. Get 7-8 hours of sleep each night. If recommended by your health care provider, drink red wine in moderation. This means 1 glass a day for nonpregnant women and 2 glasses a day for men. A glass of wine equals 5 oz (150 mL). Reading food labels  Check the serving size of packaged foods. For foods such as rice and pasta, the serving size refers to the amount of cooked product, not dry. Check the total fat in packaged foods. Avoid foods that have saturated fat or trans fats. Check the ingredients list for added sugars, such as corn syrup. Shopping  At the grocery store, buy  most of your food from the areas near the walls of the store. This includes: Fresh fruits and vegetables (produce). Grains, beans, nuts, and seeds. Some of these may be available in unpackaged forms or large amounts (in bulk). Fresh seafood. Poultry and eggs. Low-fat dairy products. Buy whole ingredients instead of prepackaged foods. Buy fresh fruits and vegetables in-season from local farmers markets. Buy  frozen fruits and vegetables in resealable bags. If you do not have access to quality fresh seafood, buy precooked frozen shrimp or canned fish, such as tuna, salmon, or sardines. Buy small amounts of raw or cooked vegetables, salads, or olives from the deli or salad bar at your store. Stock your pantry so you always have certain foods on hand, such as olive oil, canned tuna, canned tomatoes, rice, pasta, and beans. Cooking  Cook foods with extra-virgin olive oil instead of using butter or other vegetable oils. Have meat as a side dish, and have vegetables or grains as your main dish. This means having meat in small portions or adding small amounts of meat to foods like pasta or stew. Use beans or vegetables instead of meat in common dishes like chili or lasagna. Experiment with different cooking methods. Try roasting or broiling vegetables instead of steaming or sauteing them. Add frozen vegetables to soups, stews, pasta, or rice. Add nuts or seeds for added healthy fat at each meal. You can add these to yogurt, salads, or vegetable dishes. Marinate fish or vegetables using olive oil, lemon juice, garlic, and fresh herbs. Meal planning  Plan to eat 1 vegetarian meal one day each week. Try to work up to 2 vegetarian meals, if possible. Eat seafood 2 or more times a week. Have healthy snacks readily available, such as: Vegetable sticks with hummus. Greek yogurt. Fruit and nut trail mix. Eat balanced meals throughout the week. This includes: Fruit: 2-3 servings a day Vegetables: 4-5 servings a day Low-fat dairy: 2 servings a day Fish, poultry, or lean meat: 1 serving a day Beans and legumes: 2 or more servings a week Nuts and seeds: 1-2 servings a day Whole grains: 6-8 servings a day Extra-virgin olive oil: 3-4 servings a day Limit red meat and sweets to only a few servings a month What are my food choices? Mediterranean diet Recommended Grains: Whole-grain pasta. Brown rice. Bulgar  wheat. Polenta. Couscous. Whole-wheat bread. [place] Made[place]. Vegetables: Artichokes. Beets. Broccoli. Cabbage. Carrots. Eggplant. Green beans. Chard. Kale. Spinach. Onions. Leeks. Peas. Squash. Tomatoes. Peppers. Radishes. Fruits: Apples. Apricots. Avocado. Berries. Bananas. Cherries. Dates. Figs. Grapes. Lemons. Melon. Oranges. Peaches. Plums. Pomegranate. Meats and other protein foods: Beans. Almonds. Sunflower seeds. Pine nuts. Peanuts. Cod. Salmon. Scallops. Shrimp. Tuna. Tilapia. Clams. Oysters. Eggs. Dairy: Low-fat milk. Cheese. Greek yogurt. Beverages: Water. Red wine. Herbal tea. Fats and oils: Extra virgin olive oil. Avocado oil. Grape seed oil. Sweets and desserts: Greek yogurt with honey. Baked apples. Poached pears. Trail mix. Seasoning and other foods: Basil. Cilantro. Coriander. Cumin. Mint. Parsley. Sage. Rosemary. Tarragon. Garlic. Oregano. Thyme. Pepper. Balsalmic vinegar. Tahini. Hummus. Tomato sauce. Olives. Mushrooms. Limit these Grains: Prepackaged pasta or rice dishes. Prepackaged cereal with added sugar. Vegetables: Deep fried potatoes (french fries). Fruits: Fruit canned in syrup. Meats and other protein foods: Beef. Pork. Lamb. Poultry with skin. Hot dogs. [place]. Dairy: Ice cream. Sour cream. Whole milk. Beverages: Juice. Sugar-sweetened soft drinks. Beer. Liquor and spirits. Fats and oils: Butter. Canola oil. Vegetable oil. Beef fat (tallow). Lard. Sweets and desserts: Cookies. Cakes. Pies. Candy. Seasoning and  other foods: Mayonnaise. Premade sauces and marinades. The items listed may not be a complete list. Talk with your dietitian about what dietary choices are right for you. Summary The Mediterranean diet includes both food and lifestyle choices. Eat a variety of fresh fruits and vegetables, beans, nuts, seeds, and whole grains. Limit the amount of red meat and sweets that you eat. Talk with your health care provider about whether it is safe for you to drink red  wine in moderation. This means 1 glass a day for nonpregnant women and 2 glasses a day for men. A glass of wine equals 5 oz (150 mL). This information is not intended to replace advice given to you by your health care provider. Make sure you discuss any questions you have with your health care provider. Document Released: 06/17/2016 Document Revised: 07/20/2016 Document Reviewed: 06/17/2016 Elsevier Interactive Patient Education  2017 [employer].

## 2023-12-18 ENCOUNTER — Other Ambulatory Visit: Payer: Self-pay | Admitting: Family Medicine

## 2023-12-19 ENCOUNTER — Other Ambulatory Visit: Payer: Self-pay | Admitting: Family Medicine

## 2023-12-19 MED ORDER — METOPROLOL SUCCINATE ER 25 MG PO TB24: 25.0000 mg | ORAL_TABLET | Freq: Every day | ORAL | 0 refills | Status: DC

## 2024-01-02 ENCOUNTER — Ambulatory Visit: Payer: PPO

## 2024-01-02 ENCOUNTER — Ambulatory Visit: Payer: PPO | Admitting: Psychology

## 2024-01-02 DIAGNOSIS — R4189 Other symptoms and signs involving cognitive functions and awareness: Secondary | ICD-10-CM

## 2024-01-02 DIAGNOSIS — R41844 Frontal lobe and executive function deficit: Secondary | ICD-10-CM

## 2024-01-02 DIAGNOSIS — G3184 Mild cognitive impairment, so stated: Secondary | ICD-10-CM

## 2024-01-02 NOTE — Progress Notes (Signed)
   Psychometrician Note   Cognitive testing was administered to Jenny Hicks by Shan Levans, B.S. (psychometrist) under the supervision of Dr. Annice Pih, Psy.D., licensed psychologist on 01/02/2024. Jenny Hicks did not appear overtly distressed by the testing session per behavioral observation or responses across self-report questionnaires. Rest breaks were offered.    The battery of tests administered was selected by Dr. Annice Pih, Psy.D. with consideration to Jenny Hicks's current level of functioning, the nature of her symptoms, emotional and behavioral responses during interview, level of literacy, observed level of motivation/effort, and the nature of the referral question. This battery was communicated to the psychometrist. Communication between Dr. Annice Pih, Psy.D. and the psychometrist was ongoing throughout the evaluation and Dr. Annice Pih, Psy.D. was immediately accessible at all times. Dr. Annice Pih, Psy.D. provided supervision to the psychometrist on the date of this service to the extent necessary to assure the quality of all services provided.    Jenny Hicks will return within approximately 1-2 weeks for an interactive feedback session with Dr. Robbie Lis at which time her test performances, clinical impressions, and treatment recommendations will be reviewed in detail. Jenny Hicks understands she can contact our office should she require our assistance before this time.  A total of 120 minutes of billable time were spent face-to-face with Jenny Hicks by the psychometrist. This includes both test administration and scoring time. Billing for these services is reflected in the clinical report generated by Dr. Annice Pih, Psy.D.  This note reflects time spent with the psychometrician and does not include test scores or any clinical interpretations made by Dr. Robbie Lis. The full report will follow in a separate note.

## 2024-01-02 NOTE — Progress Notes (Addendum)
 NEUROPSYCHOLOGICAL EVALUATION [place]  [place] Department of Neurology  Date of Evaluation: 01/02/2024  REASON FOR REFERRAL   Jenny Hicks is a 73 year old, right-handed, White female with 16 years of formal education. She was referred for neuropsychological evaluation by Marlowe Kays, PA-C, to assess current neurocognitive functioning, document potential cognitive deficits, and assist with treatment planning. This is her first neuropsychological evaluation.  SUMMARY OF RESULTS   Premorbid cognitive abilities are estimated to be in the average range based on word reading as well as educational and occupational attainments. On assessment today, she demonstrated a subtle, relative weaknesses in encoding new information as well as mild inefficiencies in executive functioning (e.g., novel problem solving, category fluency). Regarding the former, she demonstrated below average encoding on all three memory tasks. She occasionally benefited from repeated presentation of the information. Despite reduced encoding, however, her retention and recognition of learned information after a delay was adequate. Performance on remaining measures of attention, processing speed, language, visuospatial ability, and fine motor dexterity was intact and, in some cases, exceeded expectations. On self-report questionnaires, she did not endorse clinically elevated levels of depression, anxiety, or excessive daytime sleepiness. While her score on the anxiety questionnaire was in the mild range, the symptoms she endorsed appeared physical in nature and related to existing medical conditions.  DIAGNOSTIC IMPRESSION   Results of the current evaluation indicate inefficiencies in encoding new information and in executive functioning. These weaknesses are present in the context of continued functional independence, suggesting that the patient meets criteria for mild cognitive impairment. It should be  emphasized that the degree of deficit is very mild at this time. Subjective memory difficulties in daily life may be attributed to executive inefficiencies (e.g., trouble encoding information that is not repeated or simplified, lack of implementation of effective compensatory strategies) and environmental factors (e.g., having several grandchildren visiting, disruptions to routine such as traveling, how the patient is physically feeling that day). While the early stages of a neurodegenerative process cannot be ruled out unequivocally, her performance on testing does not raise concern for Alzheimer's disease at this time. Serial monitoring will be important to ensure there is no further progression in cognitive changes. Results from today will serve as a baseline for future comparison.  ICD-10 Codes: G31.84 Mild cognitive impairment; R41.844 Executive inefficiencies  RECOMMENDATIONS   A repeat neuropsychological evaluation in one year (or sooner if functional decline is noted) is recommended to monitor the stability of the patient's cognitive functioning.  Patient has already been prescribed a medication aimed to address memory loss and concerns surrounding Alzheimer's disease (i.e., memantine).  Patient is encouraged to continue managing vascular risk factors through a heart healthy diet, physician-approved physical activity, and medication adherence.   She is also encouraged to continue participating in enjoyable, stimulating activities outside the home, such as regular exercise and social activities, as remaining socially active and maintaining a structured schedule can improve mood, sleep, and cognition.  Memory can be improved using internal strategies such as rehearsal, repetition, chunking, mnemonics, association, and imagery. External strategies such as written notes in a consistently used memory journal, visual and nonverbal auditory cues such as a calendar on the refrigerator or appointments  with alarm, such as on a cell phone, can also help maximize recall.    When learning new information, she would benefit from information being broken up into small, manageable pieces. She may also find it helpful to articulate the material in her own words and in a context  to promote encoding at the onset of a new task. This material may need to be repeated multiple times to promote encoding.  To address executive inefficiencies identified on assessment today, she may wish to consider some of the following techniques:   -Avoid external distractions when needing to concentrate.   -Limit exposure to fast paced environments with multiple sensory demands.   -Write down complicated information and using checklists.   -Attempt and complete one task at a time (i.e., no multi-tasking).   -Verbalize aloud each step of a task to maintain focus.   -Take frequent breaks during the completion of steps/tasks to avoid fatigue.   -Reduce the amount of information considered at one time.   -Schedule more difficult activities for a time of day where she is usually most alert.  DISPOSITION   The patient will follow up with the referring provider, [name]. A repeat neuropsychological evaluation in one year (or sooner if functional decline is noted) is recommended. The patient and her husband will be provided verbal feedback in approximately one week regarding the findings and impression during this visit.  The remainder of the report includes the details of the patient's background and a table of results from the current evaluation, which support the summary and recommendations described above.  BACKGROUND   History of Presenting Illness: The following information was obtained following a review of medical records and an interview with the patient and her husband, [name]. Patient recently established care with Marlowe Kays, PA-C, on 11/21/2023 due to concerns of memory decline over the past two years in the  setting of a family history of Alzheimer's disease in the patient's mother. MoCA = 23/30. Patient was started on memantine and referred for neuropsychological evaluation accordingly.  Cognitive Functioning: During today's appointment, the patient reported that her children encouraged her to get a memory evaluation, as a couple of them work with Alzheimer's clinical trials. Patient endorsed mild short-term memory concerns (e.g., occasionally forgetting to complete a task and once forgetting the day of the week) that have slightly progressed over the past two years. She also reported mildly reduced word finding and processing speed. She denied concerns with attention, navigation, and executive functioning (e.g., planning, organizing). Her husband corroborated her report. He further noted that sometimes the patient's environment, such as having several of the grandchildren over, contributes to lapses in short-term memory.  Physical Functioning: Patient denied difficulties with sleep initiation and maintenance. She wakes to use the restroom but is able to return to sleep. She averages 6-7 hours of sleep per night. Appetite is stable but generally reduced compared to past years. No changes to sense of taste or smell were reported. Vision, with the exception of early cataracts identified, and hearing are stable. Patient endorsed occasional balance difficulties over the past two years and noted that she takes extra care when walking to avoid falls. She sustained one fall approximately two years ago in the setting of a bladder infection. She denied any falls since that time. She denied urinary incontinence.  Emotional Functioning: Patient denied mood concerns and expressed that she generally "seem[s] to be in a good mood." She continues to engage in hobbies, such as traveling and playing the piano.  Imaging: MRI of the brain (10/24/2023) documented mild age-related volume loss and mild chronic small vessel changes.  MRA of the head and neck (10/24/2023) was unremarkable.  Other Medical History: Remarkable for hyperlipidemia, Lown-Ganong-Levine syndrome, paroxysmal supraventricular tachycardia, mitral valve prolapse, GERD, degenerative disc disease, osteoarthritis, osteopenia,  vitamin B12 deficiency, and vitamin D deficiency. No history of stroke, CNS infection, head injury, or seizure was reported.  Current Medications: Per patient, metoprolol, memantine, omeprazole prn, furosemide prn, vitamin B12, vitamin D3, calcium carbonate/vitamin D3, Centrum, and potassium chloride prn.  Functional Status: Patient independently performs all ADLs and IADLs, including managing finances and medication, without reported difficulty. She is able to drive and denied recent accidents and citations; however, she noted that she is less interested in driving, so her husband does the majority of the driving.  Family Neurological History: Remarkable for Alzheimer's disease (mother).  Psychiatric History: History of depression, anxiety, prior mental health treatment, suicidal ideation, hallucinations, and psychiatric hospitalizations was denied.  Social and Developmental History: Patient was born in [place], [place]. History of perinatal complications and developmental delays was not reported. Patient is married and has five children, four of whom live locally, and twelve grandchildren. Patient resides with her husband.  Educational and Occupational History: No history of childhood learning disability, special education services, or grade retention was reported. 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 She completed high school on time and obtained a [employer] in Nursing Nature conservation officer) degree from [employer] 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  Substance Use History: Patient reported rare alcohol consumption.  Current use of nicotine, marijuana, and illicit substances was denied.  BEHAVIORAL OBSERVATIONS   Patient arrived early and was accompanied by her husband, [name]. She ambulated independently and without gait disturbance. She was alert and fully oriented. She was appropriately groomed and dressed for the setting. No significant sensory or motor abnormalities were observed. Vision (corrected with lenses) and hearing were adequate for testing purposes. Speech was of normal rate, prosody, and volume. No conversational word-finding difficulties, paraphasic errors, or dysarthria were observed. Comprehension was conversationally intact. Thought processes were linear, logical, and coherent. Thought content was organized and devoid of delusions. Insight appeared intact. Affect was even and congruent with mood. She was cooperative and gave adequate effort during testing. Results are thought to accurately reflect her cognitive functioning at this time.  NEUROPSYCHOLOGICAL TESTING RESULTS   Tests Administered: Animal Naming Test; Beck Anxiety Inventory (BAI); Beck Depression Inventory II (BDI-II); Brief Visuospatial Memory Test-Revised (BVMT-R) - Form 1; Controlled Oral Word Association Test (COWAT): FAS; Epworth Sleepiness Scale (ESS); Grooved Pegboard Test; Hopkins Verbal Learning Test Revised (HVLT-R) - From 1; Judgment of Line Orientation (JLO) - Form V; Neuropsychological Assessment Battery (NAB) - Subtest(s): Naming Form 1; Standalone performance validity test (PVT); Test of Premorbid Functioning (TOPF); Trail Making Test (TMT); Wechsler Adult Intelligence Scale Fourth Edition (WAIS-IV) - Subtest(s): Block Design, Matrix Reasoning, Similarities, Digit Span, Symbol Search, Coding; Wechsler Memory Scale Fourth Edition (WMS-IV) - Subtest(s): Logical Memory (LM); and [employer] Card Version (WCST-64).  Test results are provided in the table below. Whenever possible, the patient's scores were  compared against age-, sex-, and education-corrected normative samples. Interpretive descriptions are based on the AACN consensus conference statement on uniform labeling (Guilmette et al., 2020).  PREMORBID FUNCTIONING RAW Std.S RANGE  TOPF 41 100 Average  ATTENTION & WORKING MEMORY RAW ss RANGE  WAIS-IV Digit Span -- 9 Average  Forward -- 8 Average  Backward -- 9 Average  Sequencing -- 10 Average  PROCESSING SPEED RAW T/ss RANGE  Trails A 27''0e 56 Average  WAIS-IV Processing Speed -- Std.S=114 Exceptionally High  Symbol  Search -- 10 Average  Coding  -- 15 Above Average  EXECUTIVE FUNCTION RAW T/ss RANGE  Trails B 74''0e 54 Average  WAIS-IV Matrix Reasoning -- 7 Low Average  WAIS-IV Similarities -- 7 Low Average  COWAT Letter Fluency 11+6+15 40 Low Average  Animal Naming Test 12 33 Below Average  WCST-64 Total Errors 34 35 Below Average  WCST-64 Perseverative Errors 26 30 Below Average  WCST-64 Nonperseverative Errors 8 50 Average  WCST-64 Categories Completed 1 11-16%ile Low Average  WCSR-64 FMS 0 -- --  LANGUAGE RAW T/ss RANGE  COWAT Letter Fluency 11+6+15 40 Low Average  Animal Naming Test 12 33 Below Average  WAIS-IV Similarities -- 7 Low Average  NAB Naming Test 31/31 58 WNL  VISUOSPATIAL RAW ss RANGE  WAIS-IV Block Design -- 11 Average  WAIS-IV Matrix Reasoning -- 7 Low Average  JLO C/S=31/30 86+%ile High Average to Exceptionally High  BVMT-R Copy Trial 12/12 -- WNL  VERBAL LEARNING & MEMORY RAW T/ss RANGE  HVLT Learning Trials (4+6+6)/36 35 Below Average  HVLT Delayed Recall 6/12 41 Low Average  HVLT Recognition Hits 12 -- --  HVLT Recognition False Positives 1 -- --  HVLT Discrimination Index 11 53 Average  WMS-IV LM-I  (5+8+4)/53 5 Below Average  WMS-IV LM-II  (3+1)/39 4 Below Average  WMS-IV LM Recognition  (7+9)/23 17-25%ile Low Average  VISUOSPATIAL LEARNING & MEMORY RAW T RANGE  BVMT-R Total Recall (3+5+3)/36 33 Below Average  BVMT-R Delayed Recall 5/12 38  Low Average  BVMT-R Percent Retained 100 >16%ile WNL  BVMT-R Recognition Hits 5 >16%ile WNL  BVMT-R Recognition False Alarms 1 11-16%ile Low Average  BVMT-R Recognition Discrimination Index 4 11-16%ile Low Average  FINE MOTOR DEXTERITY RAW T RANGE  Grooved Pegboard (Dominant Hand) 95''2d 39 Low Average  Grooved Pegboard (Non-Dominant Hand) 88''2d 48 Average  QUESTIONNAIRES RAW  RANGE  BDI 5 -- Minimal  BAI 8 -- Mild  ESS 4 -- WNL  Note: ss = scaled score; Std.S = standard score; T = t-score; C/S = corrected raw score; WNL = within normal limits; BNL= below normal limits; D/C = discontinued. Scores from skewed distributions are typically interpreted as WNL (>=16th %ile) or BNL (<16th %ile).   INFORMED CONSENT   Patient was provided with a verbal description of the nature and purpose of the neuropsychological evaluation. Also reviewed were the foreseeable risks and/or discomforts and benefits of the procedure, limits of confidentiality, and mandatory reporting requirements of this provider. Patient was given the opportunity to have their questions answered. Oral consent to participate was provided by the patient.   This report was prepared as part of a clinical evaluation and is not intended for forensic use.  SERVICE   This evaluation was conducted by Annice Pih, Psy.D. In addition to time spent directly with the patient, total professional time includes record review, integration of relevant medical history, test selection, interpretation of findings, and report preparation. A technician, Shan Levans, B.S., provided testing and scoring assistance for 120 minutes.  Psychiatric Diagnostic Evaluation Services (Professional): 16109 x 1 Neuropsychological Testing Evaluation Services (Professional): 60454 x 1 Neuropsychological Testing Evaluation Services (Professional): 09811 x 1 Neuropsychological Test Administration and Scoring Radiographer, therapeutic): [phone] x 1 Neuropsychological Test  Administration and Scoring (Technician): [phone] x 3  This report was generated using voice recognition software. While this document has been carefully reviewed, transcription errors may be present. I apologize in advance for any inconvenience. Please contact me if further clarification is needed.  Annice Pih, Psy.D.             Neuropsychologist

## 2024-01-31 ENCOUNTER — Ambulatory Visit: Payer: PPO | Admitting: Psychology

## 2024-01-31 DIAGNOSIS — G3184 Mild cognitive impairment, so stated: Secondary | ICD-10-CM | POA: Diagnosis not present

## 2024-01-31 NOTE — Progress Notes (Signed)
   NEUROPSYCHOLOGY FEEDBACK SESSION [place]  [place] Department of Neurology  Date of Feedback Session: 01/31/2024  REASON FOR REFERRAL   Makella Buckingham is a 73 year old, right-handed, White female with 16 years of formal education. She was referred for neuropsychological evaluation by Marlowe Kays, PA-C, to assess current neurocognitive functioning, document potential cognitive deficits, and assist with treatment planning.   FEEDBACK   Patient completed a comprehensive neuropsychological evaluation on 01/02/2024. Please refer to that encounter for the full report and recommendations. Briefly, results indicated inefficiencies in encoding new information and in aspects of executive functioning. These weaknesses are present in the context of continued functional independence, suggesting that the patient meets criteria for mild cognitive impairment. It should be emphasized that the degree of deficit is very mild at this time. Subjective memory difficulties in daily life may be attributed to executive inefficiencies (e.g., trouble encoding information that is not repeated or simplified, lack of implementation of effective compensatory strategies) and environmental factors (e.g., having several grandchildren visiting, disruptions to routine such as traveling, how the patient is physically feeling that day). While the early stages of a neurodegenerative process cannot be ruled out unequivocally, her performance on testing does not raise concern for Alzheimer's disease at this time. Serial monitoring will be important to ensure there is no further progression in cognitive changes.  Today, the patient was accompanied by her husband. They were provided verbal feedback regarding the findings and impression during this visit, and their questions were answered. A copy of the report was provided at the conclusion of the visit.  DISPOSITION   Patient will follow up with the referring  provider, [name]. Patient should return for repeat neuropsychological testing in 12-18 months to monitor her course and assist with diagnosis and treatment planning.  SERVICE   This feedback session was conducted by Annice Pih, Psy.D. One unit of [id number] was billed for Dr. Robbie Lis' time spent in preparing, conducting, and documenting the current feedback session.  This report was generated using voice recognition software. While this document has been carefully reviewed, transcription errors may be present. I apologize in advance for any inconvenience. Please contact me if further clarification is needed.

## 2024-02-01 ENCOUNTER — Ambulatory Visit: Payer: PPO | Admitting: Family Medicine

## 2024-02-01 ENCOUNTER — Ambulatory Visit
Admission: RE | Admit: 2024-02-01 | Discharge: 2024-02-01 | Disposition: A | Discharge status: Home / Self Care | Payer: PPO | Source: Ambulatory Visit | Attending: Family Medicine | Admitting: Family Medicine

## 2024-02-01 ENCOUNTER — Encounter: Payer: Self-pay | Admitting: Family Medicine

## 2024-02-01 VITALS — BP 122/78 | HR 71 | Temp 98.0°F | Ht 64.0 in | Wt 160.0 lb

## 2024-02-01 DIAGNOSIS — M8589 Other specified disorders of bone density and structure, multiple sites: Secondary | ICD-10-CM

## 2024-02-01 DIAGNOSIS — Z1231 Encounter for screening mammogram for malignant neoplasm of breast: Secondary | ICD-10-CM | POA: Diagnosis not present

## 2024-02-01 DIAGNOSIS — E782 Mixed hyperlipidemia: Secondary | ICD-10-CM

## 2024-02-01 DIAGNOSIS — I456 Pre-excitation syndrome: Secondary | ICD-10-CM

## 2024-02-01 DIAGNOSIS — E538 Deficiency of other specified B group vitamins: Secondary | ICD-10-CM

## 2024-02-01 DIAGNOSIS — E559 Vitamin D deficiency, unspecified: Secondary | ICD-10-CM | POA: Diagnosis not present

## 2024-02-01 DIAGNOSIS — R4189 Other symptoms and signs involving cognitive functions and awareness: Secondary | ICD-10-CM | POA: Diagnosis not present

## 2024-02-01 DIAGNOSIS — Z0000 Encounter for general adult medical examination without abnormal findings: Secondary | ICD-10-CM

## 2024-02-01 DIAGNOSIS — K219 Gastro-esophageal reflux disease without esophagitis: Secondary | ICD-10-CM

## 2024-02-01 DIAGNOSIS — I471 Supraventricular tachycardia, unspecified: Secondary | ICD-10-CM | POA: Diagnosis not present

## 2024-02-01 DIAGNOSIS — K449 Diaphragmatic hernia without obstruction or gangrene: Secondary | ICD-10-CM | POA: Diagnosis not present

## 2024-02-01 LAB — LIPID PANEL
Cholesterol: 190 mg/dL (ref 0–200)
HDL: 59.6 mg/dL (ref >39.00)
LDL Cholesterol: 111 mg/dL — ABNORMAL HIGH (ref 0–99)
NonHDL: 130.4
Total CHOL/HDL Ratio: 3
Triglycerides: 98 mg/dL (ref 0.0–149.0)
VLDL: 19.6 mg/dL (ref 0.0–40.0)

## 2024-02-01 LAB — CBC
HCT: 41.6 % (ref 36.0–46.0)
Hemoglobin: 13.9 g/dL (ref 12.0–15.0)
MCHC: 33.4 g/dL (ref 30.0–36.0)
MCV: 93.2 fl (ref 78.0–100.0)
Platelets: 253 10*3/uL (ref 150.0–400.0)
RBC: 4.47 Mil/uL (ref 3.87–5.11)
RDW: 13 % (ref 11.5–15.5)
WBC: 5.2 10*3/uL (ref 4.0–10.5)

## 2024-02-01 LAB — COMPREHENSIVE METABOLIC PANEL WITH GFR
ALT: 16 U/L (ref 0–35)
AST: 18 U/L (ref 0–37)
Albumin: 4.2 g/dL (ref 3.5–5.2)
Alkaline Phosphatase: 56 U/L (ref 39–117)
BUN: 17 mg/dL (ref 6–23)
CO2: 32 meq/L (ref 19–32)
Calcium: 9.4 mg/dL (ref 8.4–10.5)
Chloride: 104 meq/L (ref 96–112)
Creatinine, Ser: 0.64 mg/dL (ref 0.40–1.20)
GFR: 88.11 mL/min (ref >60.00)
Glucose, Bld: 90 mg/dL (ref 70–99)
Potassium: 4.6 meq/L (ref 3.5–5.1)
Sodium: 142 meq/L (ref 135–145)
Total Bilirubin: 0.5 mg/dL (ref 0.2–1.2)
Total Protein: 6.6 g/dL (ref 6.0–8.3)

## 2024-02-01 LAB — HEMOGLOBIN A1C: Hgb A1c MFr Bld: 5.5 % (ref 4.6–6.5)

## 2024-02-01 LAB — MAGNESIUM: Magnesium: 2.1 mg/dL (ref 1.5–2.5)

## 2024-02-01 MED ORDER — OMEPRAZOLE 20 MG PO CPDR: 20.0000 mg | DELAYED_RELEASE_CAPSULE | Freq: Every day | ORAL | 1 refills | Status: DC

## 2024-02-01 MED ORDER — METOPROLOL SUCCINATE ER 25 MG PO TB24: 12.5000 mg | ORAL_TABLET | Freq: Every day | ORAL | 3 refills | Status: DC

## 2024-02-01 NOTE — Progress Notes (Signed)
 Patient ID: Jenny Hicks, female  DOB: 08-31-1951, 73 y.o.   MRN: [id number] Patient Care Team    Relationship Specialty Notifications Start End  Natalia Leatherwood, DO PCP - General Family Medicine  09/16/16   Chrystie Nose, MD Consulting Physician Cardiology  09/16/16   Annamaria Helling, MD Consulting Physician Obstetrics and Gynecology  09/16/16   Nadara Mustard, MD Consulting Physician Orthopedic Surgery  11/28/18   Jerrell Mylar, MD Referring Physician Gynecology  01/06/21   Napoleon Form, MD Consulting Physician Gastroenterology  10/25/22     Chief Complaint  Patient presents with   Annual Exam    Chronic Conditions/illness Management Pt is fasting.     Subjective: Jenny Hicks is a 73 y.o.  Female  present for CPE and Chronic Conditions/illness Management All past medical history, surgical history, allergies, family history, immunizations, medications and social history were updated in the electronic medical record today. All recent labs, ED visits and hospitalizations within the last year were reviewed.  Health maintenance:  Colonoscopy: completed 04/2021- 3 yr follow up, by Dr. Koren Bound this summer- pt aware and will call Mammogram: completed:has scheduled 01/31/2023 [facility] [place]. FHx present in mother (prostate and ovarian cancers also in many members of family on paternal side)> mammogram scheduled today Immunizations: td 12/2017 UTD, Influenza UTD 2024(encouraged yearly), PNA series completed 2019.  zostavax completed. shingrix completed Infectious disease screening:Hep C and HIV completed DEXA: 01/31/2023 osteopenia (-1.9).  [facility]. Vitamin D within normal limits.  Patient has a Dental home. Hospitalizations/ED visits: Reviewed     02/01/2024    8:38 AM 10/26/2023    8:34 AM 03/18/2023    8:11 AM 01/31/2023    8:19 AM 10/20/2022   10:53 AM  Depression screen PHQ 2/9  Decreased Interest 0 0 0 0 0  Down, Depressed, Hopeless 0 0 0 0 0  PHQ - 2 Score 0 0 0 0  0  Altered sleeping 0 0     Tired, decreased energy 0 0     Change in appetite 0 0     Feeling bad or failure about yourself  0 0     Trouble concentrating 0 0     Moving slowly or fidgety/restless 0 0     Suicidal thoughts 0 0     PHQ-9 Score 0 0     Difficult doing work/chores Not difficult at all Not difficult at all         02/01/2024    8:38 AM  GAD 7 : Generalized Anxiety Score  Nervous, Anxious, on Edge 0  Control/stop worrying 0  Worry too much - different things 0  Trouble relaxing 0  Restless 0  Easily annoyed or irritable 0  Afraid - awful might happen 0  Total GAD 7 Score 0  Anxiety Difficulty Not difficult at all    Immunization History  Administered Date(s) Administered   Influenza Split 08/26/2011, 08/28/2012   Influenza, High Dose Seasonal PF 07/18/2018, 09/03/2019   Influenza-Unspecified 08/24/2014, 09/01/2015, 07/25/2016, 08/22/2017, 09/03/2019, 09/09/2020, 08/19/2021, 08/19/2022, 07/27/2023   PFIZER(Purple Top)SARS-COV-2 Vaccination 12/17/2019, 01/09/2020, 08/22/2020, 08/12/2021, 08/26/2023   Pneumococcal Conjugate-13 10/08/2016   Pneumococcal Polysaccharide-23 03/15/2013, 12/15/2017   Td 12/15/2017   Tdap 11/09/2007, 12/15/2017   Zoster Recombinant(Shingrix) 01/22/2021, 05/28/2021   Zoster, Live 11/06/2011     Past Medical History:  Diagnosis Date   Abdominal pain 10/25/2022   Benign cyst of kidney    Bone spur of right foot  Chicken pox    Colitis 01/06/2021   DDD (degenerative disc disease), cervical    Diverticulitis 10/06/2020   treatment given in ER   GERD (gastroesophageal reflux disease)    Heart palpitations    History of nuclear stress test 03/2013   stress myoview; normal study; SVT (HR 200) - adenosine   Kidney stone 10/15/2021   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 03/2013 Echo: EF 55-60%, mild MVP, mild to mod MR.   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  Recommended continued compression for symptomatic improvement.  She can continue Advil as needed for pain.  Recommend no lifting more than 5 pounds for another 3 weeks and then gradual increase to tolerance.  She can move the wrist and the elbow to tolerance   Scoliosis    SVT (supraventricular tachycardia) (HCC)    a. 03/2013 occurred during ex MV-->resolved with adenosine and carotid massage.   Allergies  Allergen Reactions   Hydrocodone-Acetaminophen Other (See Comments)    Made her BP go way up   Pantoprazole Sodium Palpitations   Past Surgical History:  Procedure Laterality Date   COLONOSCOPY  01/12/2011   DIAGNOSTIC MAMMOGRAM  08/06/2014   digital   TRANSTHORACIC ECHOCARDIOGRAM  03/2013   EF 55-60%, mild conc hypertrophy; mild MVP, mild-mod MR   UMBILICAL HERNIA REPAIR  03/02/2021   with colprpex   VAGINAL HYSTERECTOMY  1989   partial; pt has both ovaries   Family History  Problem Relation Age of Onset   Heart attack Mother    Hypertension Mother    Stroke Mother    Breast cancer Mother    Osteoporosis Mother        cervical/femur fracture   Kidney disease Mother    Alzheimer's disease Mother    Atrial fibrillation Mother    Myasthenia gravis Father    Prostate cancer Father    Prostate cancer Brother    Early death Maternal Grandmother        died after 01-25-24 baby   Heart disease Maternal Grandfather    Congestive Heart Failure Maternal Grandfather    Ovarian cancer Paternal Grandmother    Other Daughter        mitral valve damaged had to be repaired, cause unknown   Arthritis Maternal Aunt    Arthritis Maternal Uncle    Prostate cancer Maternal Uncle    Breast cancer Cousin    Colon cancer Neg Hx    Esophageal cancer Neg Hx    Rectal cancer Neg Hx    Colon polyps Neg Hx    Stomach cancer Neg Hx    Social History   Social History Narrative    Married to [place], they have 5 children.    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   Drinks caffeine, takes a daily vitamin   Wears seatbelt, smoke detector in the home, no firearms in the home.    Feels safe in her relationships.    One floor home   Right handed   One cup of coffee daily   13 grandchildren       Allergies as of 02/01/2024       Reactions   Hydrocodone-acetaminophen Other (See Comments)   Made her BP go  way up   Pantoprazole Sodium Palpitations        Medication List        Accurate as of February 01, 2024  9:14 AM. If you have any questions, ask your nurse or doctor.          STOP taking these medications    celecoxib 100 MG capsule Commonly known as: CeleBREX Stopped by: Felix Pacini   furosemide 20 MG tablet Commonly known as: LASIX Stopped by: Felix Pacini   potassium chloride SA 20 MEQ tablet Commonly known as: KLOR-CON M Stopped by: Felix Pacini       TAKE these medications    B-12 1000-400 MCG Subl Place under the tongue once. Takes dropper once a day   CALCIUM 600 + D PO Take 600 mg by mouth daily. Vit D 800 units   CENTRUM PO Take by mouth daily.   memantine 5 MG tablet Commonly known as: NAMENDA Take 1 tablet (5 mg total) by mouth at bedtime.   metoprolol succinate 25 MG 24 hr tablet Commonly known as: TOPROL-XL Take 0.5 tablets (12.5 mg total) by mouth daily. What changed: how much to take Changed by: Felix Pacini   omeprazole 20 MG capsule Commonly known as: PRILOSEC Take 1 capsule (20 mg total) by mouth daily.   Vitamin D (Cholecalciferol) 25 MCG (1000 UT) Tabs Take by mouth. 2,000 units 3x a week        All past medical history, surgical history, allergies, family history, immunizations andmedications were updated in the EMR today and reviewed under the history and medication portions of their EMR.      ROS 14 pt review of systems performed and negative (unless mentioned in an  HPI)  Objective: BP 122/78   Pulse 71   Temp 98 F (36.7 C)   Ht 5\' 4"  (1.626 m)   Wt 160 lb (72.6 kg)   SpO2 96%   BMI 27.46 kg/m  Physical Exam Vitals and nursing note reviewed.  Constitutional:      General: She is not in acute distress.    Appearance: Normal appearance. She is not ill-appearing or toxic-appearing.  HENT:     Head: Normocephalic and atraumatic.     Right Ear: Tympanic membrane, ear canal and external ear normal. There is no impacted cerumen.     Left Ear: Tympanic membrane, ear canal and external ear normal. There is no impacted cerumen.     Nose: No congestion or rhinorrhea.     Mouth/Throat:     Mouth: Mucous membranes are moist.     Pharynx: Oropharynx is clear. No oropharyngeal exudate or posterior oropharyngeal erythema.  Eyes:     General: No scleral icterus.       Right eye: No discharge.        Left eye: No discharge.     Extraocular Movements: Extraocular movements intact.     Conjunctiva/sclera: Conjunctivae normal.     Pupils: Pupils are equal, round, and reactive to light.  Cardiovascular:     Rate and Rhythm: Normal rate and regular rhythm.     Pulses: Normal pulses.     Heart sounds: Normal heart sounds. No murmur heard.    No friction rub. No gallop.  Pulmonary:     Effort: Pulmonary effort is normal. No respiratory distress.     Breath sounds: Normal breath sounds. No stridor. No wheezing, rhonchi or rales.  Chest:     Chest wall: No tenderness.  Abdominal:  General: Abdomen is flat. Bowel sounds are normal. There is no distension.     Palpations: Abdomen is soft. There is no mass.     Tenderness: There is no abdominal tenderness. There is no right CVA tenderness, left CVA tenderness, guarding or rebound.     Hernia: No hernia is present.  Musculoskeletal:        General: No swelling, tenderness or deformity. Normal range of motion.     Cervical back: Normal range of motion and neck supple. No rigidity or tenderness.     Right  lower leg: No edema.     Left lower leg: No edema.  Lymphadenopathy:     Cervical: No cervical adenopathy.  Skin:    General: Skin is warm and dry.     Coloration: Skin is not jaundiced or pale.     Findings: No bruising, erythema, lesion or rash.  Neurological:     General: No focal deficit present.     Mental Status: She is alert and oriented to person, place, and time. Mental status is at baseline.     Cranial Nerves: No cranial nerve deficit.     Sensory: No sensory deficit.     Motor: No weakness.     Coordination: Coordination normal.     Gait: Gait normal.     Deep Tendon Reflexes: Reflexes normal.  Psychiatric:        Mood and Affect: Mood normal.        Behavior: Behavior normal.        Thought Content: Thought content normal.        Judgment: Judgment normal.      No results found.  Assessment/plan: Jenny Hicks is a 73 y.o. female present for CPE and Chronic Conditions/illness Management Paroxysmal SVT (supraventricular tachycardia) (HCC)/MVP/MI Historically, Managed by cardio on metoprolol 12.5 mg daily (as needed)- pt stopped secondary to low pressures.   Osteopenia, unspecified location/Vitamin D deficiency -Vitamin D levels UTD 08/2023 -DEXA UTD 01/2023-T-score -1.9 - Continue supplementing vitamin D  GERD: Continue protonix prn- some stomach discomfort on exam today. Pt was encouraged to reatrt ppi and if no improvement call her GI Vit b12 UTD 08/2023  mag collected today  Hyperlipidemia, unspecified hyperlipidemia type/Overweight (BMI 25.0-29.9) Lipids collected today Diet controlled  Elevated glucose: A1c collected today  Encounter for preventive health examination Patient was encouraged to exercise greater than 150 minutes a week. Patient was encouraged to choose a diet filled with fresh fruits and vegetables, and lean meats. AVS provided to patient today for education/recommendation on gender specific health and safety  maintenance. Colonoscopy: completed 04/2021- 3 yr follow up, by Dr. Koren Bound this summer- pt aware and will call Mammogram: completed:has scheduled 01/31/2023 [facility] [place]. FHx present in mother (prostate and ovarian cancers also in many members of family on paternal side)> mammogram scheduled today Immunizations: td 12/2017 UTD, Influenza UTD 2024(encouraged yearly), PNA series completed 2019.  zostavax completed. shingrix completed Infectious disease screening:Hep C and HIV completed DEXA: 01/31/2023 osteopenia (-1.9).  [facility]. Vitamin D within normal limits.   Return in about 1 year (around 02/01/2025) for cpe (20 min), Routine chronic condition follow-up.    Orders Placed This Encounter  Procedures   CBC   Comprehensive metabolic panel   Hemoglobin A1c   Lipid panel   TSH   Magnesium   Meds ordered this encounter  Medications   metoprolol succinate (TOPROL-XL) 25 MG 24 hr tablet    Sig: Take 0.5 tablets (12.5 mg total) by  mouth daily.    Dispense:  90 tablet    Refill:  3    Hold until pt requests   omeprazole (PRILOSEC) 20 MG capsule    Sig: Take 1 capsule (20 mg total) by mouth daily.    Dispense:  90 capsule    Refill:  1   Referral Orders  No referral(s) requested today      Electronically signed by: Felix Pacini, DO [facility] [place]

## 2024-02-01 NOTE — Patient Instructions (Addendum)
 Return in about 1 year (around 02/01/2025) for cpe (20 min), Routine chronic condition follow-up.   Try the nasal navage system to help with sinus.  Monitor your stool changes and stomach discomfort, if worsens make sure to call your gastro team to get in sooner.        Great to see you today.  I have refilled the medication(s) we provide.   If labs were collected or images ordered, we will inform you of  results once we have received them and reviewed. We will contact you either by echart message, or telephone call.  Please give ample time to the testing facility, and our office to run,  receive and review results. Please do not call inquiring of results, even if you can see them in your chart. We will contact you as soon as we are able. If it has been over 1 week since the test was completed, and you have not yet heard from [place], then please call us.    - echart message- for normal results that have been seen by the patient already.   - telephone call: abnormal results or if patient has not viewed results in their echart.  If a referral to a specialist was entered for you, please call us in 2 weeks if you have not heard from the specialist office to schedule.

## 2024-02-02 LAB — TSH: TSH: 1.68 u[IU]/mL (ref 0.35–5.50)

## 2024-02-03 ENCOUNTER — Encounter: Payer: Self-pay | Admitting: Family Medicine

## 2024-02-23 ENCOUNTER — Encounter: Payer: Self-pay | Admitting: Gastroenterology

## 2024-03-08 ENCOUNTER — Other Ambulatory Visit: Payer: Self-pay | Admitting: Physician Assistant

## 2024-03-08 NOTE — Telephone Encounter (Signed)
 Continue present dose, thanks

## 2024-03-23 ENCOUNTER — Ambulatory Visit: Payer: PPO | Admitting: Physician Assistant

## 2024-04-10 ENCOUNTER — Ambulatory Visit (AMBULATORY_SURGERY_CENTER)

## 2024-04-10 ENCOUNTER — Encounter: Payer: Self-pay | Admitting: Gastroenterology

## 2024-04-10 VITALS — Ht 64.0 in | Wt 157.0 lb

## 2024-04-10 DIAGNOSIS — Z860100 Personal history of colon polyps, unspecified: Secondary | ICD-10-CM

## 2024-04-10 MED ORDER — NA SULFATE-K SULFATE-MG SULF 17.5-3.13-1.6 GM/177ML PO SOLN
1.0000 | Freq: Once | ORAL | 0 refills | Status: AC
Start: 2024-04-10 — End: 2024-04-10

## 2024-04-10 NOTE — Progress Notes (Signed)

## 2024-04-19 NOTE — Progress Notes (Signed)
 Assessment/Plan:      Memory impairment of unclear etiology.  Jenny Hicks is a very pleasant 73 y.o. RH female with a history of hypertension, hyperlipidemia, SVT/Lown Georgana Kilts syndrome, DDD, GERD, and a diagnosis of MCI (mild) but not raising concern for Alzheimer's Disease at this time  seen today for evaluation of memory loss. MoCA today is 23/30  presenting today in follow-up for evaluation of memory loss. Patient is on memantine  5 mg twice daily, tolerating well. Patient is able to participate on ADLs . Mood is stable.       Recommendations:   Follow up in 1 year  Continue memantine  5 mg twice daily, side effects discussed. Repeat neuropsych evaluation in 12 months for diagnostic clarity   Continue B12 and D replenishment Recommend good control of cardiovascular risk factors Continue to control mood as per PCP Recommend psychotherapy for situational anxiety and stress     Subjective:   This patient is accompanied in the office by her husband  who supplements the history. Previous records as well as any outside records available were reviewed prior to todays visit.   Patient was last seen on 11/21/23 with MoCA 23/30  .    Any changes in memory since last visit?  Denies repeats oneself?  Endorsed Disoriented when walking into a room?  Patient denies    Misplacing objects?  Patient denies   Wandering behavior?   Denies. Any personality changes since last visit? Denies.   Any worsening depression?: denies.   Hallucinations or paranoia?  Denies.   Seizures?   Denies.    Any sleep changes? Sleeps well  Denies vivid dreams, REM behavior or sleepwalking   Sleep apnea?   denies   Any hygiene concerns?   Denies.   Independent of bathing and dressing?  Endorsed  Does the patient needs help with medications? Patient is in charge  Who is in charge of the finances?  Patient is in charge    Any changes in appetite?  Denies.    Patient have trouble swallowing?  Denies.    Does the patient cook?  Any kitchen accidents such as leaving the stove on?   Denies.   Any headaches?    Denies.   Vision changes? Denies. Chronic pain?  Denies.   Ambulates with difficulty?    Denies.   Recent falls or head injuries?    Denies.      Unilateral weakness, numbness or tingling?  Denies.   Any tremors?  Denies.   Any anosmia?    Denies.   Any incontinence of urine?  Denies.   Any bowel dysfunction?  Denies.      Patient lives with husband  .  Does the patient drive? Does not drive   Initial visit 06/18/9146  How long did patient have memory difficulties? For the last 2 years, worse over the last few months.  Reports some difficulty remembering new information, conversations and names. May forget in mid-sentence what I was going to say. Long-term memory may be affected, but not sure.  repeats oneself?  Endorsed by her husband Disoriented when walking into a room? Denies. Patient denies except occasionally not remembering what patient came to the room for.  Leaving objects in unusual places? Denies.   Wandering behavior?  denies .  Any personality changes?   Sometimes I am feeling more reserved.  Any history of depression?:  Denies   Hallucinations or paranoia?  Denies   Seizures?  Denies  Any sleep changes?   Sleeps well. Denies vivid dreams, REM behavior or sleepwalking   Sleep apnea?  Denies   Any hygiene concerns?  Denies   Independent of bathing and dressing?  Endorsed  Does the patient needs help with medications? Patient is in charge   Who is in charge of the finances? Patient is in charge.     Any changes in appetite?  Not eating as much as before. Does not drink enough water.      Patient have trouble swallowing? Denies.   Does the patient cook? Yes,  he had trouble remembering common recipes  Any kitchen accidents such as leaving the stove on? Denies.   Any history of headaches? 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 Chronic  pain ?  Has hand arthritis, takes tylenol so she can play the piano and violin. She also has issues with plantar fascitis.  Ambulates with difficulty?  If plantar fascitis flare she can have some issues with walking, at this point well-controlled.  Recent falls or head injuries? Denies. 2 years ago she sustained a mechanical fall with elbow fracture.  No head injury or LOC Vision changes? Beginning of cataracts.  Unilateral weakness, numbness or tingling? Denies.   Any tremors?   Denies.   Any anosmia?  Denies.   Any incontinence of urine? Denies. Prior bladder and rectal prolapse, s/p repair.  Any bowel dysfunction?  As above, the patient has a history of rectal prolapse and may need revision with GI Patient lives with husband.     History of heavy alcohol intake? Denies.   History of heavy tobacco use? Denies.   Family history of dementia? Mother AD .   Does patient drive? Yes, not much due to chronic neck issues, limited ROM L     Recent Labs 09/2023 TSH 1.61, B12 1537. 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 These weaknesses are present in the context of continued functional independence, suggesting that the patient meets criteria for mild cognitive impairment. It should be emphasized that the degree of deficit is very mild at this time. Subjective memory difficulties in daily life may be attributed to executive inefficiencies (e.g., trouble encoding information that is not repeated or simplified, lack of implementation of effective compensatory strategies) and environmental factors (e.g., having several grandchildren visiting, disruptions to routine such as traveling, how the patient is physically feeling  that day). While the early stages of a neurodegenerative process cannot be ruled out unequivocally, her performance on testing does not raise concern for Alzheimer's disease at this time. Serial monitoring will be important to ensure there is no further progression in cognitive changes.    Past Medical History:  Diagnosis Date   Abdominal pain 10/25/2022   Benign cyst of kidney    Bone spur of right foot    Chicken pox    Colitis 01/06/2021   DDD (degenerative disc disease), cervical    Diverticulitis 10/06/2020   treatment given in ER   GERD (gastroesophageal reflux disease)    Heart palpitations    History of nuclear stress test 03/2013   stress myoview ; normal study; SVT (HR 200) - adenosine   Kidney stone 10/15/2021   Lown Georgana Kilts syndrome  Mitral valve prolapse    a. 03/2013 Echo: EF 55-60%, mild MVP, mild to mod MR.   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  Recommended continued compression for symptomatic improvement.  She can continue Advil as needed for pain.  Recommend no lifting more than 5 pounds for another 3 weeks and then gradual increase to tolerance.  She can move the wrist and the elbow to tolerance   Scoliosis    SVT (supraventricular tachycardia) (HCC)    a. 03/2013 occurred during ex MV-->resolved with adenosine and carotid massage.     Past Surgical History:  Procedure Laterality Date   bladder prolapse  2022   COLONOSCOPY  01/12/2011   DIAGNOSTIC MAMMOGRAM  08/06/2014   digital   TRANSTHORACIC ECHOCARDIOGRAM  03/2013   EF 55-60%, mild conc hypertrophy; mild MVP, mild-mod MR   UMBILICAL HERNIA REPAIR  03/02/2021   with colprpex   VAGINAL HYSTERECTOMY  1989   partial; pt has both ovaries     PREVIOUS MEDICATIONS:   CURRENT MEDICATIONS:  Outpatient Encounter Medications as of 04/20/2024   Medication Sig   Calcium Carb-Cholecalciferol (CALCIUM 600 + D PO) Take 600 mg by mouth daily. Vit D 800 units   Cobalamin Combinations (B-12) 1000-400 MCG SUBL Place under the tongue once. Takes dropper once a day   memantine  (NAMENDA ) 5 MG tablet TAKE 1 TABLET BY MOUTH AT BEDTIME (Patient taking differently: Take 2.5 mg by mouth at bedtime.)   metoprolol  succinate (TOPROL -XL) 25 MG 24 hr tablet Take 0.5 tablets (12.5 mg total) by mouth daily.   Multiple Vitamins-Minerals (CENTRUM MULTIGUMMIES PO) Take by mouth.   Na Sulfate-K Sulfate-Mg Sulfate concentrate (SUPREP) 17.5-3.13-1.6 GM/177ML SOLN Take 1 kit by mouth once. (Patient not taking: Reported on 04/20/2024)   omeprazole  (PRILOSEC) 20 MG capsule Take 1 capsule (20 mg total) by mouth daily. (Patient taking differently: Take 20 mg by mouth daily as needed.)   Vitamin D , Cholecalciferol, 25 MCG (1000 UT) TABS Take 2,000 Units by mouth. 2,000 units 3x a week   [DISCONTINUED] Multiple Vitamins-Minerals (CENTRUM PO) Take by mouth daily.   No facility-administered encounter medications on file as of 04/20/2024.     Objective:     PHYSICAL EXAMINATION:    VITALS:   Vitals:   04/20/24 1407  BP: 116/69  Pulse: 80  Resp: 18  SpO2: 98%  Weight: 158 lb (71.7 kg)  Height: 5' 4 (1.626 m)    GEN:  The patient appears stated age and is in NAD. HEENT:  Normocephalic, atraumatic.   Neurological examination:  General: NAD, well-groomed, appears stated age. Orientation: The patient is alert. Oriented to person, place and to date.  Cranial nerves: There is good facial symmetry.The speech is fluent and clear. No aphasia or dysarthria. Fund of knowledge is appropriate. Recent memory impaired and remote memory is normal.  Attention and concentration are normal.  Able to name objects and repeat phrases.  Hearing is intact to conversational tone .   Sensation: Sensation is intact to light touch throughout Motor: Strength is at least antigravity  x4. DTR's 2/4 in UE/LE      11/21/2023   11:00 AM  Montreal Cognitive Assessment   Visuospatial/ Executive (0/5) 4  Naming (0/3) 3  Attention: Read list of digits (0/2) 2  Attention: Read list of letters (0/1) 1  Attention: Serial  7 subtraction starting at 100 (0/3) 2  Language: Repeat phrase (0/2) 2  Language : Fluency (0/1) 1  Abstraction (0/2) 0  Delayed Recall (0/5) 2  Orientation (0/6) 6  Total 23  Adjusted Score (based on education) 23       11/28/2018    3:08 PM  MMSE - Mini Mental State Exam  Orientation to time 5  Orientation to Place 5  Registration 3  Attention/ Calculation 5  Recall 2  Language- name 2 objects 2  Language- repeat 1  Language- follow 3 step command 3  Language- read & follow direction 1  Write a sentence 1  Copy design 1  Total score 29       Movement examination: Tone: There is normal tone in the UE/LE Abnormal movements:  no tremor.  No myoclonus.  No asterixis.   Coordination:  There is no decremation with RAM's. Normal finger to nose  Gait and Station: The patient has no difficulty arising out of a deep-seated chair without the use of the hands. The patient's stride length is good.  Gait is cautious and narrow.   Thank you for allowing us  the opportunity to participate in the care of this nice patient. Please do not hesitate to contact us  for any questions or concerns.   Total time spent on today's visit was 29 minutes dedicated to this patient today, preparing to see patient, examining the patient, ordering tests and/or medications and counseling the patient, documenting clinical information in the EHR or other health record, independently interpreting results and communicating results to the patient/family, discussing treatment and goals, answering patient's questions and coordinating care.  Cc:  Kuneff, Renee A, DO  Nashla Althoff 04/20/2024 2:35 PM

## 2024-04-20 ENCOUNTER — Ambulatory Visit: Admitting: Physician Assistant

## 2024-04-20 ENCOUNTER — Ambulatory Visit: Payer: Self-pay

## 2024-04-20 ENCOUNTER — Ambulatory Visit (INDEPENDENT_AMBULATORY_CARE_PROVIDER_SITE_OTHER): Admitting: Family Medicine

## 2024-04-20 ENCOUNTER — Encounter: Payer: Self-pay | Admitting: Physician Assistant

## 2024-04-20 VITALS — BP 133/81 | HR 84 | Temp 98.9°F | Ht 64.0 in | Wt 157.2 lb

## 2024-04-20 VITALS — BP 116/69 | HR 80 | Resp 18 | Ht 64.0 in | Wt 158.0 lb

## 2024-04-20 DIAGNOSIS — R040 Epistaxis: Secondary | ICD-10-CM

## 2024-04-20 DIAGNOSIS — R5383 Other fatigue: Secondary | ICD-10-CM | POA: Diagnosis not present

## 2024-04-20 DIAGNOSIS — N644 Mastodynia: Secondary | ICD-10-CM | POA: Diagnosis not present

## 2024-04-20 DIAGNOSIS — R413 Other amnesia: Secondary | ICD-10-CM

## 2024-04-20 DIAGNOSIS — G3184 Mild cognitive impairment, so stated: Secondary | ICD-10-CM | POA: Diagnosis not present

## 2024-04-20 MED ORDER — MEMANTINE HCL 5 MG PO TABS: 5.0000 mg | ORAL_TABLET | Freq: Every day | ORAL | 4 refills | Status: AC

## 2024-04-20 NOTE — Telephone Encounter (Signed)
Pt scheduled for appt.

## 2024-04-20 NOTE — Telephone Encounter (Signed)
 FYI Only or Action Required?: FYI only for provider  Patient was last seen in primary care on 02/01/2024 by Napolean Backbone A, DO. Called Nurse Triage reporting Bleeding/Bruising. Symptoms began yesterday. Interventions attempted: Nothing. Symptoms are: gradually worsening.  Triage Disposition: See Physician Within 24 Hours, See PCP When Office is Open (Within 3 Days)  Patient/caregiver understands and will follow disposition?: Yes   Copied from CRM [id number]. Topic: Clinical - Red Word Triage >> Apr 20, 2024  7:51 AM Alyse July wrote: Red Word that prompted transfer to Nurse Triage: Nose Bleed   ----------------------------------------------------------------------- From previous Reason for Contact - Scheduling: Patient/patient representative is calling to schedule an appointment. Refer to attachments for appointment information. Reason for Disposition  [1] MODERATE pain (e.g., interferes with normal activities) AND [2] present > 3 days  [1] Bleeding recurs 3 or more times in 24 hours AND [2] direct pressure applied correctly    Nosebleed on after blowing: bright red moderate amount of bleeding with each blow.  Answer Assessment - Initial Assessment Questions 1. AMOUNT OF BLEEDING: How bad is the bleeding? How much blood was lost? Has the bleeding stopped?   - MILD: needed a couple tissues   - MODERATE: needed many tissues   - SEVERE: large blood clots, soaked many tissues, lasted more than 30 minutes      moderate 2. ONSET: When did the nosebleed start?      yesterday 3. FREQUENCY: How many nosebleeds have you had in the last 24 hours?      several 4. RECURRENT SYMPTOMS: Have there been other recent nosebleeds? If Yes, ask: How long did it take you to stop the bleeding? What worked best?      no 5. CAUSE: What do you think caused this nosebleed?     unknown 6. LOCAL FACTORS: Do you have any cold symptoms?, Have you been rubbing or picking at your nose?      no 7. SYSTEMIC FACTORS: Do you have high blood pressure or any bleeding problems?     yes 8. BLOOD THINNERS: Do you take any blood thinners? (e.g., aspirin, clopidogrel / Plavix, coumadin, heparin). Notes: Other strong blood thinners include: Arixtra (fondaparinux), Eliquis (apixaban), Pradaxa (dabigatran), and Xarelto (rivaroxaban).     no 9. OTHER SYMPTOMS: Do you have any other symptoms? (e.g., lightheadedness)     BP 132/ 84- pt reported BP usually is lower than this,  HR 85, weak, nosebleed when blowing nose & bleeding draining down throat, blood - bright red, cough, 10. PREGNANCY: Is there any chance you are pregnant? When was your last menstrual period?       N/a  Answer Assessment - Initial Assessment Questions 1. ONSET: When did the muscle aches or body pains start?      Ongoing and worsening 2. LOCATION: What part of your body is hurting? (e.g., entire body, arms, legs)      Areas under left breast 3. SEVERITY: How bad is the pain? (Scale 1-10; or mild, moderate, severe)   - MILD (1-3): doesn't interfere with normal activities    - MODERATE (4-7): interferes with normal activities or awakens from sleep    - SEVERE (8-10):  excruciating pain, unable to do any normal activities      moderate 4. CAUSE: What do you think is causing the pains?     unknown 5. FEVER: Have you been having fever?     no 6. OTHER SYMPTOMS: Do you have any other symptoms? (e.g., chest pain, weakness,  rash, cold or flu symptoms, weight loss)     no 7. PREGNANCY: Is there any chance you are pregnant? When was your last menstrual period?     N/a 8. TRAVEL: Have you traveled out of the country in the last month? (e.g., travel history, exposures)     N/a    Pt reports pain/discomfort under left breast area: pt unsure if muscle or bone pain  Protocols used: Nosebleed-A-AH, Muscle Aches and Body Pain-A-AH

## 2024-04-20 NOTE — Patient Instructions (Signed)
 It was a pleasure to see you today at our office.   Recommendations:  Repeat Neurocognitive evaluation at our office   Follow up in 1 year  Continue memantine  5mg  tablets nightly    For psychiatric meds, mood meds: Please have your primary care physician manage these medications.  If you have any severe symptoms of a stroke, or other severe issues such as confusion,severe chills or fever, etc call 911 or go to the ER as you may need to be evaluated further   For guidance regarding [facility] and if placement were needed at the facility, contact Social Worker tel: [phone]  For assessment of decision of mental capacity and competency:  Call Dr. Laverne Potter, geriatric psychiatrist at [phone]  Counseling regarding caregiver distress, including caregiver depression, anxiety and issues regarding community resources, adult day care programs, adult living facilities, or memory care questions:  please contact your  Primary Doctor's Social Worker   Whom to call: Memory  decline, memory medications: Call our office [phone]    [email or web address]   RECOMMENDATIONS FOR ALL PATIENTS WITH MEMORY PROBLEMS: 1. Continue to exercise (Recommend 30 minutes of walking everyday, or 3 hours every week) 2. Increase social interactions - continue going to [place] and enjoy social gatherings with friends and family 3. Eat healthy, avoid fried foods and eat more fruits and vegetables 4. Maintain adequate blood pressure, blood sugar, and blood cholesterol level. Reducing the risk of stroke and cardiovascular disease also helps promoting better memory. 5. Avoid stressful situations. Live a simple life and avoid aggravations. Organize your time and prepare for the next day in anticipation. 6. Sleep well, avoid any interruptions of sleep and avoid any distractions in the bedroom that may interfere with adequate sleep  quality 7. Avoid sugar, avoid sweets as there is a strong link between excessive sugar intake, diabetes, and cognitive impairment We discussed the Mediterranean diet, which has been shown to help patients reduce the risk of progressive memory disorders and reduces cardiovascular risk. This includes eating fish, eat fruits and green leafy vegetables, nuts like almonds and hazelnuts, walnuts, and also use olive oil. Avoid fast foods and fried foods as much as possible. Avoid sweets and sugar as sugar use has been linked to worsening of memory function.  There is always a concern of gradual progression of memory problems. If this is the case, then we may need to adjust level of care according to patient needs. Support, both to the patient and caregiver, should then be put into place.      You have been referred for a neuropsychological evaluation (i.e., evaluation of memory and thinking abilities). Please bring someone with you to this appointment if possible, as it is helpful for the doctor to hear from both you and another adult who knows you well. Please bring eyeglasses and hearing aids if you wear them.    The evaluation will take approximately 3 hours and has two parts:   The first part is a clinical interview with the neuropsychologist (Dr. Kitty Perkins or Dr. Annette Barters). During the interview, the neuropsychologist will speak with you and the individual you brought to the appointment.    The second part of the evaluation is testing with the doctor's technician Bernabe Brew or Burdette Carolin). During the testing, the technician will ask you to remember different types of material, solve problems, and answer some questionnaires. Your family member will not be present for this portion of the evaluation.   Please note: We must reserve several  hours of the neuropsychologist's time and the psychometrician's time for your evaluation appointment. As such, there is a No-Show fee of $100. If you are unable to attend any of your  appointments, please contact our office as soon as possible to reschedule.      DRIVING: Regarding driving, in patients with progressive memory problems, driving will be impaired. We advise to have someone else do the driving if trouble finding directions or if minor accidents are reported. Independent driving assessment is available to determine safety of driving.   If you are interested in the driving assessment, you can contact the following:  [facility] in [place] [phone]  [facility] [phone]  [facility] [phone]  [facility] [phone] or [phone]   FALL PRECAUTIONS: Be cautious when walking. Scan the area for obstacles that may increase the risk of trips and falls. When getting up in the mornings, sit up at the edge of the bed for a few minutes before getting out of bed. Consider elevating the bed at the head end to avoid drop of blood pressure when getting up. Walk always in a well-lit room (use night lights in the walls). Avoid area rugs or power cords from appliances in the middle of the walkways. Use a walker or a cane if necessary and consider physical therapy for balance exercise. Get your eyesight checked regularly.  FINANCIAL OVERSIGHT: Supervision, especially oversight when making financial decisions or transactions is also recommended.  HOME SAFETY: Consider the safety of the kitchen when operating appliances like stoves, microwave oven, and blender. Consider having supervision and share cooking responsibilities until no longer able to participate in those. Accidents with firearms and other hazards in the house should be identified and addressed as well.   ABILITY TO BE LEFT ALONE: If patient is unable to contact 911 operator, consider using LifeLine, or when the need is there, arrange for someone to stay with patients. Smoking is a fire hazard, consider supervision or cessation. Risk of wandering should  be assessed by caregiver and if detected at any point, supervision and safe proof recommendations should be instituted.  MEDICATION SUPERVISION: Inability to self-administer medication needs to be constantly addressed. Implement a mechanism to ensure safe administration of the medications.      Mediterranean Diet A Mediterranean diet refers to food and lifestyle choices that are based on the traditions of countries located on the [employer]. This way of eating has been shown to help prevent certain conditions and improve outcomes for people who have chronic diseases, like kidney disease and heart disease. What are tips for following this plan? Lifestyle  Cook and eat meals together with your family, when possible. Drink enough fluid to keep your urine clear or pale yellow. Be physically active every day. This includes: Aerobic exercise like running or swimming. Leisure activities like gardening, walking, or housework. Get 7-8 hours of sleep each night. If recommended by your health care provider, drink red wine in moderation. This means 1 glass a day for nonpregnant women and 2 glasses a day for men. A glass of wine equals 5 oz (150 mL). Reading food labels  Check the serving size of packaged foods. For foods such as rice and pasta, the serving size refers to the amount of cooked product, not dry. Check the total fat in packaged foods. Avoid foods that have saturated fat or trans fats. Check the ingredients list for added sugars, such as corn syrup. Shopping  At the grocery store, buy  most of your food from the areas near the walls of the store. This includes: Fresh fruits and vegetables (produce). Grains, beans, nuts, and seeds. Some of these may be available in unpackaged forms or large amounts (in bulk). Fresh seafood. Poultry and eggs. Low-fat dairy products. Buy whole ingredients instead of prepackaged foods. Buy fresh fruits and vegetables in-season from local farmers  markets. Buy frozen fruits and vegetables in resealable bags. If you do not have access to quality fresh seafood, buy precooked frozen shrimp or canned fish, such as tuna, salmon, or sardines. Buy small amounts of raw or cooked vegetables, salads, or olives from the deli or salad bar at your store. Stock your pantry so you always have certain foods on hand, such as olive oil, canned tuna, canned tomatoes, rice, pasta, and beans. Cooking  Cook foods with extra-virgin olive oil instead of using butter or other vegetable oils. Have meat as a side dish, and have vegetables or grains as your main dish. This means having meat in small portions or adding small amounts of meat to foods like pasta or stew. Use beans or vegetables instead of meat in common dishes like chili or lasagna. Experiment with different cooking methods. Try roasting or broiling vegetables instead of steaming or sauteing them. Add frozen vegetables to soups, stews, pasta, or rice. Add nuts or seeds for added healthy fat at each meal. You can add these to yogurt, salads, or vegetable dishes. Marinate fish or vegetables using olive oil, lemon juice, garlic, and fresh herbs. Meal planning  Plan to eat 1 vegetarian meal one day each week. Try to work up to 2 vegetarian meals, if possible. Eat seafood 2 or more times a week. Have healthy snacks readily available, such as: Vegetable sticks with hummus. Greek yogurt. Fruit and nut trail mix. Eat balanced meals throughout the week. This includes: Fruit: 2-3 servings a day Vegetables: 4-5 servings a day Low-fat dairy: 2 servings a day Fish, poultry, or lean meat: 1 serving a day Beans and legumes: 2 or more servings a week Nuts and seeds: 1-2 servings a day Whole grains: 6-8 servings a day Extra-virgin olive oil: 3-4 servings a day Limit red meat and sweets to only a few servings a month What are my food choices? Mediterranean diet Recommended Grains: Whole-grain pasta. Brown  rice. Bulgar wheat. Polenta. Couscous. Whole-wheat bread. Dwyane Glad. Vegetables: Artichokes. Beets. Broccoli. Cabbage. Carrots. Eggplant. Green beans. Chard. Kale. Spinach. Onions. Leeks. Peas. Squash. Tomatoes. Peppers. Radishes. Fruits: Apples. Apricots. Avocado. Berries. Bananas. Cherries. Dates. Figs. Grapes. Lemons. Melon. Oranges. Peaches. Plums. Pomegranate. Meats and other protein foods: Beans. Almonds. Sunflower seeds. Pine nuts. Peanuts. Cod. Salmon. Scallops. Shrimp. Tuna. Tilapia. Clams. Oysters. Eggs. Dairy: Low-fat milk. Cheese. Greek yogurt. Beverages: Water. Red wine. Herbal tea. Fats and oils: Extra virgin olive oil. Avocado oil. Grape seed oil. Sweets and desserts: [place] yogurt with honey. Baked apples. Poached pears. Trail mix. Seasoning and other foods: Basil. Cilantro. Coriander. Cumin. Mint. Parsley. Sage. Rosemary. Tarragon. Garlic. Oregano. Thyme. Pepper. Balsalmic vinegar. Tahini. Hummus. Tomato sauce. Olives. Mushrooms. Limit these Grains: Prepackaged pasta or rice dishes. Prepackaged cereal with added sugar. Vegetables: Deep fried potatoes (french fries). Fruits: Fruit canned in syrup. Meats and other protein foods: Beef. Pork. Lamb. Poultry with skin. Hot dogs. Helene Loader. Dairy: Ice cream. Sour cream. Whole milk. Beverages: Juice. Sugar-sweetened soft drinks. Beer. Liquor and spirits. Fats and oils: Butter. Canola oil. Vegetable oil. Beef fat (tallow). Lard. Sweets and desserts: Cookies. Cakes. Pies. Candy. Seasoning and  other foods: Mayonnaise. Premade sauces and marinades. The items listed may not be a complete list. Talk with your dietitian about what dietary choices are right for you. Summary The Mediterranean diet includes both food and lifestyle choices. Eat a variety of fresh fruits and vegetables, beans, nuts, seeds, and whole grains. Limit the amount of red meat and sweets that you eat. Talk with your health care provider about whether it is safe for you  to drink red wine in moderation. This means 1 glass a day for nonpregnant women and 2 glasses a day for men. A glass of wine equals 5 oz (150 mL). This information is not intended to replace advice given to you by your health care provider. Make sure you discuss any questions you have with your health care provider. Document Released: 06/17/2016 Document Revised: 07/20/2016 Document Reviewed: 06/17/2016 Elsevier Interactive Patient Education  2017 [employer].

## 2024-04-20 NOTE — Progress Notes (Signed)
 OFFICE VISIT  04/20/2024  CC:  Chief Complaint  Patient presents with   Epistaxis    Since last night, feels weak and tired as well. Has not used any nasal sprays, does not use air humidifier or air purifier.     Patient is a 73 y.o. female who presents for nosebleed.  HPI: Yesterday afternoon Jenny Hicks began to feel achy and tired.  Last night she noticed when she blew her nose there was some blood red blood.  It did not bleed spontaneously, just some present on the tissue when she blew his nose.  She has not been blowing her nose any more than usual.  She blows it because her nose feels congested but she never has mucus. No nasal pain. She is not on aspirin or anticoagulants.  No excessive bruising, no blood in urine, no blood in stool. She has a history of multiple colon polyps, has a 3-year repeat colonoscopy set for next week.  For a while now she has felt pain in the lateral aspect of the left breast.  She does not feel any mass but it is significantly tender to touch.  No nipple changes, no nipple discharge.  The symptom was present back in March and she had a screening mammogram not long afterwards and it was normal.  ROS as above, plus--> no fevers, no CP, no SOB, no wheezing, no cough, no dizziness, no HAs, no rashes.  No polyuria or polydipsia.  No joint swelling.  No focal weakness, paresthesias, or tremors.  No acute vision or hearing abnormalities.  No dysuria or unusual/new urinary urgency or frequency.  No recent changes in lower legs. No n/v/d or abd pain.  No palpitations.    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 03/2013 Echo: EF 55-60%, mild MVP, mild to mod MR.   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  Recommended continued compression for symptomatic improvement.  She can continue Advil as needed for pain.  Recommend no lifting more than 5 pounds for another 3 weeks and then gradual increase to tolerance.  She can move the wrist and the elbow to tolerance   Scoliosis    SVT (supraventricular tachycardia) (HCC)    a. 03/2013 occurred during ex MV-->resolved with adenosine and carotid massage.    Past Surgical History:  Procedure Laterality Date   bladder prolapse  2022   COLONOSCOPY  01/12/2011   DIAGNOSTIC MAMMOGRAM  08/06/2014   digital   TRANSTHORACIC ECHOCARDIOGRAM  03/2013   EF 55-60%, mild conc hypertrophy; mild MVP, mild-mod MR   UMBILICAL HERNIA REPAIR  03/02/2021   with colprpex   VAGINAL HYSTERECTOMY  1989   partial; pt has both ovaries    Outpatient Medications Prior to Visit  Medication Sig Dispense Refill  Calcium Carb-Cholecalciferol (CALCIUM 600 + D PO) Take 600 mg by mouth daily. Vit D 800 units     Cobalamin Combinations (B-12) 1000-400 MCG SUBL Place under the tongue once. Takes dropper once a day     memantine  (NAMENDA ) 5 MG tablet TAKE 1 TABLET BY MOUTH AT BEDTIME (Patient taking differently: Take 2.5 mg by mouth at bedtime.) 30 tablet 0   metoprolol  succinate (TOPROL -XL) 25 MG 24 hr tablet Take 0.5 tablets (12.5 mg total) by mouth daily. 90 tablet 3   Multiple Vitamins-Minerals (CENTRUM MULTIGUMMIES PO) Take by mouth.     omeprazole  (PRILOSEC) 20 MG capsule Take 1 capsule (20 mg total) by mouth daily. (Patient taking differently: Take 20 mg by mouth daily as needed.) 90 capsule 1   Vitamin D ,  Cholecalciferol, 25 MCG (1000 UT) TABS Take 2,000 Units by mouth. 2,000 units 3x a week     Na Sulfate-K Sulfate-Mg Sulfate concentrate (SUPREP) 17.5-3.13-1.6 GM/177ML SOLN Take 1 kit by mouth once. (Patient not taking: Reported on 04/20/2024)     Multiple Vitamins-Minerals (CENTRUM PO) Take by mouth daily.     No facility-administered medications prior to visit.    Allergies  Allergen Reactions   Hydrocodone-Acetaminophen Other (See Comments)    Made her BP go way up   Pantoprazole  Sodium Palpitations    Review of Systems  As per HPI  PE:    04/20/2024    9:29 AM 04/10/2024    8:07 AM 02/01/2024    8:35 AM  Vitals with BMI  Height 5' 4 5' 4 5' 4  Weight 157 lbs 3 oz 157 lbs 160 lbs  BMI 26.97 26.94 27.45  Systolic 133  122  Diastolic 81  78  Pulse 84  71     Physical Exam  Gen: Alert, well appearing.  Patient is oriented to person, place, time, and situation. AFFECT: pleasant, lucid thought and speech. No pallor. Nasal mucosa pink, no lesion, no blood. Oral cavity and pharynx appear normal. Cardiovascular: Regular rhythm and rate, no murmur Lungs are clear to auscultation bilaterally, good aeration diffusely.  LABS:  Last CBC Lab Results  Component Value Date   WBC 5.2 02/01/2024   HGB 13.9 02/01/2024   HCT 41.6 02/01/2024   MCV 93.2 02/01/2024   MCH 31.9 01/06/2021   RDW 13.0 02/01/2024   PLT 253.0 02/01/2024   Last metabolic panel Lab Results  Component Value Date   GLUCOSE 90 02/01/2024   NA 142 02/01/2024   K 4.6 02/01/2024   CL 104 02/01/2024   CO2 32 02/01/2024   BUN 17 02/01/2024   CREATININE 0.64 02/01/2024   GFR 88.11 02/01/2024   CALCIUM 9.4 02/01/2024   PROT 6.6 02/01/2024   ALBUMIN 4.2 02/01/2024   BILITOT 0.5 02/01/2024   ALKPHOS 56 02/01/2024   AST 18 02/01/2024   ALT 16 02/01/2024   IMPRESSION AND PLAN:  #1 epistaxis. Focal mucosal irritation or superficial capillary rupture suspected. Reassured. Given her recent achiness  and fatigue we will check CBC today. As long as that is normal I encouraged her to go ahead and continue with the plan of getting a colonoscopy next week.  #2 breast pain. No bruising, erythema, or masses present upon exam today. Reassured.  An After Visit Summary was printed and given to the patient.  FOLLOW UP: No follow-ups on file.  Signed:  Arletha Lady, MD           04/20/2024

## 2024-04-21 LAB — CBC
HCT: 44.9 % (ref 35.0–45.0)
Hemoglobin: 14.6 g/dL (ref 11.7–15.5)
MCH: 30.5 pg (ref 27.0–33.0)
MCHC: 32.5 g/dL (ref 32.0–36.0)
MCV: 93.7 fL (ref 80.0–100.0)
MPV: 10.2 fL (ref 7.5–12.5)
Platelets: 276 10*3/uL (ref 140–400)
RBC: 4.79 10*6/uL (ref 3.80–5.10)
RDW: 12.4 % (ref 11.0–15.0)
WBC: 6.5 10*3/uL (ref 3.8–10.8)

## 2024-04-23 ENCOUNTER — Ambulatory Visit: Payer: Self-pay | Admitting: Family Medicine

## 2024-04-23 ENCOUNTER — Encounter: Admitting: Gastroenterology

## 2024-04-23 ENCOUNTER — Ambulatory Visit (AMBULATORY_SURGERY_CENTER): Admitting: Gastroenterology

## 2024-04-23 ENCOUNTER — Encounter: Payer: Self-pay | Admitting: Gastroenterology

## 2024-04-23 VITALS — BP 115/62 | HR 68 | Temp 97.4°F | Resp 15 | Ht 64.0 in | Wt 157.0 lb

## 2024-04-23 DIAGNOSIS — D125 Benign neoplasm of sigmoid colon: Secondary | ICD-10-CM

## 2024-04-23 DIAGNOSIS — K648 Other hemorrhoids: Secondary | ICD-10-CM

## 2024-04-23 DIAGNOSIS — D123 Benign neoplasm of transverse colon: Secondary | ICD-10-CM | POA: Diagnosis not present

## 2024-04-23 DIAGNOSIS — Z860101 Personal history of adenomatous and serrated colon polyps: Secondary | ICD-10-CM | POA: Diagnosis not present

## 2024-04-23 DIAGNOSIS — Z860100 Personal history of colon polyps, unspecified: Secondary | ICD-10-CM

## 2024-04-23 DIAGNOSIS — K5730 Diverticulosis of large intestine without perforation or abscess without bleeding: Secondary | ICD-10-CM

## 2024-04-23 DIAGNOSIS — K644 Residual hemorrhoidal skin tags: Secondary | ICD-10-CM

## 2024-04-23 DIAGNOSIS — I471 Supraventricular tachycardia, unspecified: Secondary | ICD-10-CM | POA: Diagnosis not present

## 2024-04-23 DIAGNOSIS — Z1211 Encounter for screening for malignant neoplasm of colon: Secondary | ICD-10-CM | POA: Diagnosis not present

## 2024-04-23 MED ORDER — SODIUM CHLORIDE 0.9 % IV SOLN: 500.0000 mL | Freq: Once | INTRAVENOUS | Status: DC

## 2024-04-23 NOTE — Progress Notes (Unsigned)
 Pt's states no medical or surgical changes since previsit or office visit.

## 2024-04-23 NOTE — Patient Instructions (Signed)
-  Handout on polyps, diverticulosis and hemorrhoids provided. -await pathology results  YOU HAD AN ENDOSCOPIC PROCEDURE TODAY AT THE [facility]:   Refer to the procedure report that was given to you for any specific questions about what was found during the examination.  If the procedure report does not answer your questions, please call your gastroenterologist to clarify.  If you requested that your care partner not be given the details of your procedure findings, then the procedure report has been included in a sealed envelope for you to review at your convenience later.  YOU SHOULD EXPECT: Some feelings of bloating in the abdomen. Passage of more gas than usual.  Walking can help get rid of the air that was put into your GI tract during the procedure and reduce the bloating. If you had a lower endoscopy (such as a colonoscopy or flexible sigmoidoscopy) you may notice spotting of blood in your stool or on the toilet paper. If you underwent a bowel prep for your procedure, you may not have a normal bowel movement for a few days.  Please Note:  You might notice some irritation and congestion in your nose or some drainage.  This is from the oxygen used during your procedure.  There is no need for concern and it should clear up in a day or so.  SYMPTOMS TO REPORT IMMEDIATELY:  Following lower endoscopy (colonoscopy or flexible sigmoidoscopy):  Excessive amounts of blood in the stool  Significant tenderness or worsening of abdominal pains  Swelling of the abdomen that is new, acute  Fever of 100F or higher  For urgent or emergent issues, a gastroenterologist can be reached at any hour by calling [phone]. Do not use MyChart messaging for urgent concerns.    DIET:  We do recommend a small meal at first, but then you may proceed to your regular diet.  Drink plenty of fluids but you should avoid alcoholic beverages for 24 hours.  ACTIVITY:  You should plan to take it easy for the  rest of today and you should NOT DRIVE or use heavy machinery until tomorrow (because of the sedation medicines used during the test).    FOLLOW UP: Our staff will call the number listed on your records the next business day following your procedure.  We will call around 7:15- 8:00 am to check on you and address any questions or concerns that you may have regarding the information given to you following your procedure. If we do not reach you, we will leave a message.     If any biopsies were taken you will be contacted by phone or by letter within the next 1-3 weeks.  Please call us  at [phone] if you have not heard about the biopsies in 3 weeks.    SIGNATURES/CONFIDENTIALITY: You and/or your care partner have signed paperwork which will be entered into your electronic medical record.  These signatures attest to the fact that that the information above on your After Visit Summary has been reviewed and is understood.  Full responsibility of the confidentiality of this discharge information lies with you and/or your care-partner.

## 2024-04-23 NOTE — Progress Notes (Signed)
 Called to room to assist during endoscopic procedure.  Patient ID and intended procedure confirmed with present staff. Received instructions for my participation in the procedure from the performing physician.

## 2024-04-23 NOTE — Progress Notes (Unsigned)
 [facility] History and Physical   Primary Care Physician:  Mariel Shope, DO   Reason for Procedure:  History of adenomatous colon polyps  Plan:    Surveillance colonoscopy with possible interventions as needed     HPI: Jenny WIEDEL is a very pleasant 73 y.o. female here for surveillance colonoscopy. Denies any nausea, vomiting, abdominal pain, melena or bright red blood per rectum  The risks and benefits as well as alternatives of endoscopic procedure(s) have been discussed and reviewed. All questions answered. The patient agrees to proceed.    Past Medical History:  Diagnosis Date   Abdominal pain 10/25/2022   Benign cyst of kidney    Bone spur of right foot    Chicken pox    Colitis 01/06/2021   DDD (degenerative disc disease), cervical    Diverticulitis 10/06/2020   treatment given in ER   GERD (gastroesophageal reflux disease)    Heart palpitations    History of nuclear stress test 03/2013   stress myoview ; normal study; SVT (HR 200) - adenosine   Kidney stone 10/15/2021   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 03/2013 Echo: EF 55-60%, mild MVP, mild to mod MR.   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  Recommended continued compression for symptomatic improvement.  She can continue Advil as needed for pain.  Recommend no lifting more than 5 pounds for another 3 weeks and then gradual increase to tolerance.  She can move the wrist and the elbow to tolerance   Scoliosis    SVT (supraventricular tachycardia) (HCC)    a. 03/2013 occurred during ex MV-->resolved with adenosine and carotid massage.    Past Surgical History:  Procedure Laterality Date   bladder prolapse  2022   COLONOSCOPY  01/12/2011   DIAGNOSTIC MAMMOGRAM  08/06/2014   digital   TRANSTHORACIC  ECHOCARDIOGRAM  03/2013   EF 55-60%, mild conc hypertrophy; mild MVP, mild-mod MR   UMBILICAL HERNIA REPAIR  03/02/2021   with colprpex   VAGINAL HYSTERECTOMY  1989   partial; pt has both ovaries    Prior to Admission medications   Medication Sig Start Date End Date Taking? Authorizing Provider  Calcium Carb-Cholecalciferol (CALCIUM 600 + D PO) Take 600 mg by mouth daily. Vit D 800 units   Yes [provider]  Cobalamin Combinations (B-12) 1000-400 MCG SUBL Place under the tongue once. Takes dropper once a day   Yes [provider]  memantine  (NAMENDA ) 5 MG tablet Take 1 tablet (5 mg total) by mouth at bedtime. 04/20/24  Yes Wertman, Sara E, PA-C  metoprolol  succinate (TOPROL -XL) 25 MG 24 hr tablet Take 0.5 tablets (12.5 mg total) by mouth daily. 02/01/24  Yes Kuneff, Renee A, DO  Multiple Vitamins-Minerals (CENTRUM MULTIGUMMIES PO) Take by mouth.   Yes [provider]  omeprazole  (PRILOSEC) 20 MG capsule Take 1 capsule (20 mg total) by mouth daily. Patient taking differently: Take 20 mg by mouth daily as needed. 02/01/24  Yes Kuneff, Renee A, DO  Vitamin D , Cholecalciferol, 25 MCG (1000 UT) TABS Take 2,000 Units by mouth. 2,000 units 3x a week   Yes [provider]    Current Outpatient Medications  Medication Sig Dispense Refill   Calcium Carb-Cholecalciferol (CALCIUM 600 + D PO) Take 600 mg by mouth  daily. Vit D 800 units     Cobalamin Combinations (B-12) 1000-400 MCG SUBL Place under the tongue once. Takes dropper once a day     memantine  (NAMENDA ) 5 MG tablet Take 1 tablet (5 mg total) by mouth at bedtime. 90 tablet 4   metoprolol  succinate (TOPROL -XL) 25 MG 24 hr tablet Take 0.5 tablets (12.5 mg total) by mouth daily. 90 tablet 3   Multiple Vitamins-Minerals (CENTRUM MULTIGUMMIES PO) Take by mouth.     omeprazole  (PRILOSEC) 20 MG capsule Take 1 capsule (20 mg total) by mouth daily. (Patient taking differently: Take 20 mg by mouth daily as needed.) 90  capsule 1   Vitamin D , Cholecalciferol, 25 MCG (1000 UT) TABS Take 2,000 Units by mouth. 2,000 units 3x a week     Current Facility-Administered Medications  Medication Dose Route Frequency Provider Last Rate Last Admin   0.9 %  sodium chloride  infusion  500 mL Intravenous Once Captola Teschner V, MD        Allergies as of 04/23/2024 - Review Complete 04/23/2024  Allergen Reaction Noted   Hydrocodone-acetaminophen Other (See Comments) 08/02/2017   Pantoprazole  sodium Palpitations 09/11/2019    Family History  Problem Relation Age of Onset   Heart attack Mother    Hypertension Mother    Stroke Mother    Breast cancer Mother    Osteoporosis Mother        cervical/femur fracture   Kidney disease Mother    Alzheimer's disease Mother    Atrial fibrillation Mother    Myasthenia gravis Father    Prostate cancer Father    Prostate cancer Brother    Early death Maternal Grandmother        died after Apr 21, 2024 baby   Heart disease Maternal Grandfather    Congestive Heart Failure Maternal Grandfather    Ovarian cancer Paternal Grandmother    Other Daughter        mitral valve damaged had to be repaired, cause unknown   Arthritis Maternal Aunt    Arthritis Maternal Uncle    Prostate cancer Maternal Uncle    Breast cancer Cousin    Colon cancer Neg Hx    Esophageal cancer Neg Hx    Rectal cancer Neg Hx    Colon polyps Neg Hx    Stomach cancer Neg Hx     Social History   Socioeconomic History   Marital status: Married    Spouse name: Siegfried Dress   Number of children: 5   Years of education: 16   Highest education level: Bachelor's degree (e.g., BA, AB, BS)  Occupational History   Occupation: retired Charity fundraiser  Tobacco Use   Smoking status: Never   Smokeless tobacco: Never  Vaping Use   Vaping status: Never Used  Substance and Sexual Activity   Alcohol use: No   Drug use: No   Sexual activity: Yes    Partners: Male    Birth control/protection: Surgical    Comment: married  Other  Topics Concern   Not on file  Social History Narrative   Married to [place], they have 5 children.    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   Drinks caffeine, takes a daily vitamin   Wears seatbelt, smoke detector in the home, no firearms in the home.    Feels safe in her relationships.    One floor home   Right handed   One cup of coffee daily   13  grandchildren      Social Drivers of Corporate investment banker Strain: Low Risk  (04/20/2024)   Overall Financial Resource Strain (CARDIA)    Difficulty of Paying Living Expenses: Not very hard  Food Insecurity: No Food Insecurity (04/20/2024)   Hunger Vital Sign    Worried About Running Out of Food in the Last Year: Never true    Ran Out of Food in the Last Year: Never true  Transportation Needs: No Transportation Needs (04/20/2024)   PRAPARE - Administrator, Civil Service (Medical): No    Lack of Transportation (Non-Medical): No  Physical Activity: Insufficiently Active (04/20/2024)   Exercise Vital Sign    Days of Exercise per Week: 5 days    Minutes of Exercise per Session: 20 min  Stress: No Stress Concern Present (04/20/2024)   [employer] of Occupational Health - Occupational Stress Questionnaire    Feeling of Stress: Not at all  Social Connections: Moderately Integrated (04/20/2024)   Social Connection and Isolation Panel    Frequency of Communication with Friends and Family: More than three times a week    Frequency of Social Gatherings with Friends and Family: More than three times a week    Attends Religious Services: More than 4 times per year    Active Member of [employer] or Organizations: No    Attends Banker Meetings: Not on file    Marital Status: Married  Catering manager Violence: Not At Risk (10/26/2023)   Humiliation, Afraid, Rape, and Kick questionnaire    Fear of Current or Ex-Partner: No    Emotionally Abused: No    Physically Abused: No    Sexually  Abused: No    Review of Systems:  All other review of systems negative except as mentioned in the HPI.  Physical Exam: Vital signs in last 24 hours: BP 139/78   Pulse 79   Temp (!) 97.4 F (36.3 C)   Ht 5' 4 (1.626 m)   Wt 157 lb (71.2 kg)   SpO2 96%   BMI 26.95 kg/m  General:   Alert, NAD Lungs:  Clear .   Heart:  Regular rate and rhythm Abdomen:  Soft, nontender and nondistended. Neuro/Psych:  Alert and cooperative. Normal mood and affect. A and O x 3  Reviewed labs, radiology imaging, old records and pertinent past GI work up  Patient is appropriate for planned procedure(s) and anesthesia in an ambulatory setting   K. Veena Lynn Recendiz , MD [phone]

## 2024-04-23 NOTE — Progress Notes (Unsigned)
 Vss nad trans to pacu

## 2024-04-23 NOTE — Op Note (Signed)
 Jenny Hicks Patient Name: Jenny Hicks Procedure Date: 04/23/2024 9:51 AM MRN: [id number] Endoscopist: Sergio Dandy , MD, [id number] Age: 73 Referring MD:  Date of Birth: [birth year] Gender: Female Account #: [id number] Procedure:                Colonoscopy Indications:              High risk colon cancer surveillance: Personal                            history of adenoma (10 mm or greater in size), High                            risk colon cancer surveillance: Personal history of                            multiple (3 or more) adenomas Medicines:                Monitored Anesthesia Care Procedure:                Pre-Anesthesia Assessment:                           - Prior to the procedure, a History and Physical                            was performed, and patient medications and                            allergies were reviewed. The patient's tolerance of                            previous anesthesia was also reviewed. The risks                            and benefits of the procedure and the sedation                            options and risks were discussed with the patient.                            All questions were answered, and informed consent                            was obtained. Prior Anticoagulants: The patient has                            taken no anticoagulant or antiplatelet agents. ASA                            Grade Assessment: II - A patient with mild systemic                            disease. After reviewing the risks and benefits,  the patient was deemed in satisfactory condition to                            undergo the procedure.                           After obtaining informed consent, the colonoscope                            was passed under direct vision. Throughout the                            procedure, the patient's blood pressure, pulse, and                            oxygen saturations were  monitored continuously. The                            [id number]-HQ190L Colonoscope [id number] was introduced                            through the anus and advanced to the the cecum,                            identified by appendiceal orifice and ileocecal                            valve. The colonoscopy was performed without                            difficulty. The patient tolerated the procedure                            well. The quality of the bowel preparation was                            good. The ileocecal valve, appendiceal orifice, and                            rectum were photographed. Scope In: 10:05:27 AM Scope Out: 10:21:04 AM Scope Withdrawal Time: 0 hours 11 minutes 24 seconds  Total Procedure Duration: 0 hours 15 minutes 37 seconds  Findings:                 The perianal and digital rectal examinations were                            normal.                           Two sessile polyps were found in the sigmoid colon                            and transverse colon. The polyps were 4 to 7 mm in  size. These polyps were removed with a cold snare.                            Resection and retrieval were complete.                           Scattered large-mouthed, medium-mouthed and                            small-mouthed diverticula were found in the sigmoid                            colon and descending colon.                           Non-bleeding external and internal hemorrhoids were                            found during retroflexion. The hemorrhoids were                            medium-sized. Complications:            No immediate complications. Estimated Blood Loss:     Estimated blood loss was minimal. Impression:               - Two 4 to 7 mm polyps in the sigmoid colon and in                            the transverse colon, removed with a cold snare.                            Resected and retrieved.                           -  Diverticulosis in the sigmoid colon and in the                            descending colon.                           - Non-bleeding external and internal hemorrhoids. Recommendation:           - Patient has a contact number available for                            emergencies. The signs and symptoms of potential                            delayed complications were discussed with the                            patient. Return to normal activities tomorrow.                            Written discharge instructions were provided to the  patient.                           - Resume previous diet.                           - Continue present medications.                           - Await pathology results.                           - Repeat colonoscopy in 5 years for surveillance                            based on pathology results. Jenny Hicks V. Jenny Zawistowski, MD 04/23/2024 10:26:21 AM This report has been signed electronically.

## 2024-04-24 ENCOUNTER — Telehealth: Payer: Self-pay | Admitting: *Deleted

## 2024-04-24 NOTE — Telephone Encounter (Signed)
  Follow up Call-     04/23/2024    9:07 AM  Call back number  Post procedure Call Back phone  # [phone]  Permission to leave phone message Yes     Patient questions:  Do you have a fever, pain , or abdominal swelling? No. Pain Score  0 *  Have you tolerated food without any problems? No.  Have you been able to return to your normal activities? Yes.    Do you have any questions about your discharge instructions: Diet   No. Medications  No. Follow up visit  No.  Do you have questions or concerns about your Care? No.  Actions: * If pain score is 4 or above: No action needed, pain <4.  Pt stated she had some amount of blood per rectum that has improved.  Pt understood that small amount is normal.  Education provided.  Pt verbalized understanding.  Advised pt to call back if bleeding increases or does not improve.

## 2024-04-25 LAB — SURGICAL PATHOLOGY

## 2024-05-30 ENCOUNTER — Other Ambulatory Visit: Payer: Self-pay | Admitting: Family Medicine

## 2024-05-30 NOTE — Telephone Encounter (Signed)
 Sent mychart to confirm pharmacy

## 2024-06-05 DIAGNOSIS — Z01419 Encounter for gynecological examination (general) (routine) without abnormal findings: Secondary | ICD-10-CM | POA: Diagnosis not present

## 2024-06-08 ENCOUNTER — Ambulatory Visit: Payer: Self-pay | Admitting: Gastroenterology

## 2024-07-18 ENCOUNTER — Ambulatory Visit: Payer: Self-pay

## 2024-07-18 NOTE — Telephone Encounter (Signed)
 Pt scheduled

## 2024-07-18 NOTE — Telephone Encounter (Signed)
  FYI Only or Action Required?: FYI only for provider.  Patient was last seen in primary care on 04/20/2024 by McGowen, [name], MD.  Called Nurse Triage reporting Shortness of Breath.  Symptoms began today.  Interventions attempted: Nothing.  Symptoms are: stable.  Triage Disposition: See PCP When Office is Open (Within 3 Days)  Patient/caregiver understands and will follow disposition?: Yes Copied from CRM #[id number]. Topic: Clinical - Red Word Triage >> Jul 18, 2024 10:54 AM Jenny Hicks wrote: [facility] that prompted transfer to Nurse Triage: Patient called in regarding not feeling well, really bloated for 2 days, having trouble taking a deep breath , also haves no appetite Reason for Disposition  [1] MODERATE longstanding difficulty breathing (e.g., speaks in phrases, SOB even at rest, pulse 100-120) AND [2] SAME as normal  Answer Assessment - Initial Assessment Questions 1. RESPIRATORY STATUS: Describe your breathing? (e.g., wheezing, shortness of breath, unable to speak, severe coughing)      Unable to take a deep breath, denies sob 2. ONSET: When did this breathing problem begin?      3 days ago 3. PATTERN Does the difficult breathing come and go, or has it been constant since it started?      States has bloating too.,constant 4. SEVERITY: How bad is your breathing? (e.g., mild, moderate, severe)      States breathing well, just can't take a deep breath 5. RECURRENT SYMPTOM: Have you had difficulty breathing before? If Yes, ask: When was the last time? and What happened that time?      denies 6. CARDIAC HISTORY: Do you have any history of heart disease? (e.g., heart attack, angina, bypass surgery, angioplasty)      117/79 pulse 79 today, retired Engineer, civil (consulting), SVT 7. LUNG HISTORY: Do you have any history of lung disease?  (e.g., pulmonary embolus, asthma, emphysema)     denies 8. CAUSE: What do you think is causing the breathing problem?      bloating 9. OTHER  SYMPTOMS: Do you have any other symptoms? (e.g., chest pain, cough, dizziness, fever, runny nose)     Hx dizziness 10. O2 SATURATION MONITOR:  Do you use an oxygen saturation monitor (pulse oximeter) at home? If Yes, ask: What is your reading (oxygen level) today? What is your usual oxygen saturation reading? (e.g., 95%)       na 11. PREGNANCY: Is there any chance you are pregnant? When was your last menstrual period?       na 12. TRAVEL: Have you traveled out of the country in the last month? (e.g., travel history, exposures)       denies  Protocols used: Breathing Difficulty-A-AH

## 2024-07-19 ENCOUNTER — Ambulatory Visit (INDEPENDENT_AMBULATORY_CARE_PROVIDER_SITE_OTHER): Admitting: Family Medicine

## 2024-07-19 ENCOUNTER — Encounter: Payer: Self-pay | Admitting: Family Medicine

## 2024-07-19 ENCOUNTER — Ambulatory Visit: Payer: Self-pay | Admitting: Family Medicine

## 2024-07-19 ENCOUNTER — Ambulatory Visit
Admission: RE | Admit: 2024-07-19 | Discharge: 2024-07-19 | Disposition: A | Discharge status: Home / Self Care | Source: Ambulatory Visit | Attending: Family Medicine | Admitting: Family Medicine

## 2024-07-19 VITALS — BP 124/80 | HR 83 | Temp 98.1°F | Wt 160.0 lb

## 2024-07-19 DIAGNOSIS — R1033 Periumbilical pain: Secondary | ICD-10-CM | POA: Diagnosis not present

## 2024-07-19 DIAGNOSIS — R0602 Shortness of breath: Secondary | ICD-10-CM | POA: Diagnosis not present

## 2024-07-19 DIAGNOSIS — N200 Calculus of kidney: Secondary | ICD-10-CM | POA: Diagnosis not present

## 2024-07-19 DIAGNOSIS — R140 Abdominal distension (gaseous): Secondary | ICD-10-CM | POA: Diagnosis not present

## 2024-07-19 LAB — CBC WITH DIFFERENTIAL/PLATELET
Basophils Absolute: 0.1 K/uL (ref 0.0–0.1)
Basophils Relative: 1.2 % (ref 0.0–3.0)
Eosinophils Absolute: 0 K/uL (ref 0.0–0.7)
Eosinophils Relative: 0.8 % (ref 0.0–5.0)
HCT: 42.7 % (ref 36.0–46.0)
Hemoglobin: 14.3 g/dL (ref 12.0–15.0)
Lymphocytes Relative: 23.8 % (ref 12.0–46.0)
Lymphs Abs: 1.4 K/uL (ref 0.7–4.0)
MCHC: 33.4 g/dL (ref 30.0–36.0)
MCV: 92 fl (ref 78.0–100.0)
Monocytes Absolute: 0.5 K/uL (ref 0.1–1.0)
Monocytes Relative: 8.2 % (ref 3.0–12.0)
Neutro Abs: 3.8 K/uL (ref 1.4–7.7)
Neutrophils Relative %: 66 % (ref 43.0–77.0)
Platelets: 263 K/uL (ref 150.0–400.0)
RBC: 4.64 Mil/uL (ref 3.87–5.11)
RDW: 13.1 % (ref 11.5–15.5)
WBC: 5.8 K/uL (ref 4.0–10.5)

## 2024-07-19 LAB — COMPREHENSIVE METABOLIC PANEL WITH GFR
ALT: 16 U/L (ref 0–35)
AST: 17 U/L (ref 0–37)
Albumin: 4.3 g/dL (ref 3.5–5.2)
Alkaline Phosphatase: 59 U/L (ref 39–117)
BUN: 15 mg/dL (ref 6–23)
CO2: 31 meq/L (ref 19–32)
Calcium: 9.5 mg/dL (ref 8.4–10.5)
Chloride: 101 meq/L (ref 96–112)
Creatinine, Ser: 0.75 mg/dL (ref 0.40–1.20)
GFR: 79.12 mL/min (ref >60.00)
Glucose, Bld: 91 mg/dL (ref 70–99)
Potassium: 4.5 meq/L (ref 3.5–5.1)
Sodium: 140 meq/L (ref 135–145)
Total Bilirubin: 1 mg/dL (ref 0.2–1.2)
Total Protein: 6.8 g/dL (ref 6.0–8.3)

## 2024-07-19 LAB — LIPASE: Lipase: 22 U/L (ref 11.0–59.0)

## 2024-07-19 LAB — POC COVID19 BINAXNOW: SARS Coronavirus 2 Ag: NEGATIVE

## 2024-07-19 LAB — C-REACTIVE PROTEIN: CRP: <1 mg/dL (ref 0.5–20.0)

## 2024-07-19 MED ORDER — IOPAMIDOL (ISOVUE-300) INJECTION 61%
100.0000 mL | Freq: Once | INTRAVENOUS | Status: AC | PRN
  Administered 2024-07-19: 100 mL via INTRAVENOUS

## 2024-07-19 MED ORDER — SUCRALFATE 1 G PO TABS: 1.0000 g | ORAL_TABLET | Freq: Three times a day (TID) | ORAL | 0 refills | Status: DC

## 2024-07-19 MED ORDER — OMEPRAZOLE 40 MG PO CPDR: 40.0000 mg | DELAYED_RELEASE_CAPSULE | Freq: Every day | ORAL | 3 refills | Status: DC

## 2024-07-19 NOTE — Progress Notes (Signed)
 Results given to pt

## 2024-07-19 NOTE — Progress Notes (Signed)
 Jenny Hicks , 03/28/1951, 73 y.o., female MRN: [id number] Patient Care Team    Relationship Specialty Notifications Start End  Catherine Charlies LABOR, DO PCP - General Family Medicine  09/16/16   Mona Vinie BROCKS, MD Consulting Physician Cardiology  09/16/16   Rox Charleston, MD Consulting Physician Obstetrics and Gynecology  09/16/16   Harden Jerona GAILS, MD Consulting Physician Orthopedic Surgery  11/28/18   Alvia Dorothyann LABOR, MD Referring Physician Gynecology  01/06/21   Shila Gustav GAILS, MD Consulting Physician Gastroenterology  10/25/22     Chief Complaint  Patient presents with   Shortness of Breath    3 days;  upper abdominal bloating causing SOB. Pt has not tried anything to relieve sx.       Subjective: Jenny Hicks is a 73 y.o. Pt presents for an OV with complaints of shortness of breath of 3 days duration.  Associated symptoms include abd bloating.  Patient reports the shortness of breath is mainly because she feels so bloated she cannot take a deep breath in.  She feels like she cannot get a full breath without feeling uncomfortable in her stomach.  She complains of epigastric and periumbilical abdominal discomfort today.  She states she is also had sharp shooting pains from her left upper quadrant intermittently.  She endorses chills, no appetite, feels generally weak, increased belching. She denies nausea or vomiting.  She denies constipation.  She reports changes in the color of her stool sometimes, but otherwise bowel movements are normal. She has a history of umbilical hernia repair.  She has a history of hiatal hernia, GERD and diverticulosis. She does not routinely consume NSAIDs or alcohol.  Cholesterol has been normal.      02/01/2024    8:38 AM 10/26/2023    8:34 AM 03/18/2023    8:11 AM 01/31/2023    8:19 AM 10/20/2022   10:53 AM  Depression screen PHQ 2/9  Decreased Interest 0 0 0 0 0  Down, Depressed, Hopeless 0 0 0 0 0  PHQ - 2 Score 0 0 0 0 0  Altered  sleeping 0 0     Tired, decreased energy 0 0     Change in appetite 0 0     Feeling bad or failure about yourself  0 0     Trouble concentrating 0 0     Moving slowly or fidgety/restless 0 0     Suicidal thoughts 0 0     PHQ-9 Score 0 0     Difficult doing work/chores Not difficult at all Not difficult at all       Allergies  Allergen Reactions   Hydrocodone-Acetaminophen Other (See Comments)    Made her BP go way up   Pantoprazole  Sodium Palpitations   Social History   Social History Narrative   Married to [place], they have 5 children.    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   Drinks caffeine, takes a daily vitamin   Wears seatbelt, smoke detector in the home, no firearms in the home.    Feels safe in her relationships.    One floor home   Right handed   One cup of coffee daily   13 grandchildren      Past Medical History:  Diagnosis Date   Abdominal pain 10/25/2022   Benign cyst of kidney    Bone spur of right foot    Chicken  pox    Colitis 01/06/2021   DDD (degenerative disc disease), cervical    Diverticulitis 10/06/2020   treatment given in ER   GERD (gastroesophageal reflux disease)    Heart palpitations    History of nuclear stress test 03/2013   stress myoview ; normal study; SVT (HR 200) - adenosine   Kidney stone 10/15/2021   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 03/2013 Echo: EF 55-60%, mild MVP, mild to mod MR.   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  Recommended continued compression for symptomatic improvement.  She can continue Advil as needed for pain.  Recommend no lifting more than 5 pounds for another 3 weeks and then gradual increase to tolerance.  She can move the wrist and the elbow to tolerance   Scoliosis    SVT (supraventricular  tachycardia) (HCC)    a. 03/2013 occurred during ex MV-->resolved with adenosine and carotid massage.   Past Surgical History:  Procedure Laterality Date   bladder prolapse  2022   COLONOSCOPY  01/12/2011   DIAGNOSTIC MAMMOGRAM  08/06/2014   digital   TRANSTHORACIC ECHOCARDIOGRAM  03/2013   EF 55-60%, mild conc hypertrophy; mild MVP, mild-mod MR   UMBILICAL HERNIA REPAIR  03/02/2021   with colprpex   VAGINAL HYSTERECTOMY  1989   partial; pt has both ovaries   Family History  Problem Relation Age of Onset   Heart attack Mother    Hypertension Mother    Stroke Mother    Breast cancer Mother    Osteoporosis Mother        cervical/femur fracture   Kidney disease Mother    Alzheimer's disease Mother    Atrial fibrillation Mother    Myasthenia gravis Father    Prostate cancer Father    Prostate cancer Brother    Early death Maternal Grandmother        died after 2024/08/01 baby   Heart disease Maternal Grandfather    Congestive Heart Failure Maternal Grandfather    Ovarian cancer Paternal Grandmother    Other Daughter        mitral valve damaged had to be repaired, cause unknown   Arthritis Maternal Aunt    Arthritis Maternal Uncle    Prostate cancer Maternal Uncle    Breast cancer Cousin    Colon cancer Neg Hx    Esophageal cancer Neg Hx    Rectal cancer Neg Hx    Colon polyps Neg Hx    Stomach cancer Neg Hx    Allergies as of 07/19/2024       Reactions   Hydrocodone-acetaminophen Other (See Comments)   Made her BP go way up   Pantoprazole  Sodium Palpitations        Medication List        Accurate as of July 19, 2024 10:48 AM. If you have any questions, ask your nurse or doctor.          B-12 1000-400 MCG Subl Place under the tongue once. Takes dropper once a day   CALCIUM 600 + D PO Take 600 mg by mouth daily. Vit D 800 units   CENTRUM MULTIGUMMIES PO Take by mouth.   memantine  5 MG tablet Commonly known as: NAMENDA  Take 1 tablet (5 mg total) by  mouth at bedtime.   metoprolol  succinate  25 MG 24 hr tablet Commonly known as: TOPROL -XL Take 1 tablet by mouth once daily   omeprazole  20 MG capsule Commonly known as: PRILOSEC Take 1 capsule (20 mg total) by mouth daily. What changed:  when to take this reasons to take this   omeprazole  40 MG capsule Commonly known as: PRILOSEC Take 1 capsule (40 mg total) by mouth daily. What changed: You were already taking a medication with the same name, and this prescription was added. Make sure you understand how and when to take each. Changed by: Colbie Danner   sucralfate  1 g tablet Commonly known as: Carafate  Take 1 tablet (1 g total) by mouth 4 (four) times daily -  with meals and at bedtime. Started by: Chord Takahashi   Vitamin D  (Cholecalciferol) 25 MCG (1000 UT) Tabs Take 2,000 Units by mouth. 2,000 units 3x a week        All past medical history, surgical history, allergies, family history, immunizations andmedications were updated in the EMR today and reviewed under the history and medication portions of their EMR.     ROS Negative, with the exception of above mentioned in HPI   Objective:  BP 124/80   Pulse 83   Temp 98.1 F (36.7 C)   Wt 160 lb (72.6 kg)   SpO2 97%   BMI 27.46 kg/m  Body mass index is 27.46 kg/m. Physical Exam Vitals and nursing note reviewed.  Constitutional:      General: She is not in acute distress.    Appearance: Normal appearance. She is not ill-appearing, toxic-appearing or diaphoretic.  HENT:     Head: Normocephalic and atraumatic.  Eyes:     General: No scleral icterus.       Right eye: No discharge.        Left eye: No discharge.     Extraocular Movements: Extraocular movements intact.     Conjunctiva/sclera: Conjunctivae normal.     Pupils: Pupils are equal, round, and reactive to light.  Cardiovascular:     Rate and Rhythm: Normal rate and regular rhythm.  Pulmonary:     Effort: Pulmonary effort is normal. No respiratory  distress.     Breath sounds: Normal breath sounds. No wheezing, rhonchi or rales.  Abdominal:     General: Abdomen is flat. Bowel sounds are increased. There is distension.     Palpations: Abdomen is soft. There is no shifting dullness, fluid wave, hepatomegaly, splenomegaly, mass or pulsatile mass.     Tenderness: There is abdominal tenderness in the epigastric area, periumbilical area, left upper quadrant and left lower quadrant. There is guarding. There is no right CVA tenderness, left CVA tenderness or rebound. Negative signs include Murphy's sign and McBurney's sign.   Musculoskeletal:     Right lower leg: No edema.     Left lower leg: No edema.  Skin:    General: Skin is warm.     Findings: No rash.  Neurological:     Mental Status: She is alert and oriented to person, place, and time. Mental status is at baseline.     Motor: No weakness.     Gait: Gait normal.  Psychiatric:        Mood and Affect: Mood normal.        Behavior: Behavior normal.        Thought Content: Thought content normal.        Judgment: Judgment normal.      No results found. No results found. Results for  orders placed or performed in visit on 07/19/24 (from the past 24 hours)  POC COVID-19 BinaxNow     Status: Normal   Collection Time: 07/19/24  9:31 AM  Result Value Ref Range   SARS Coronavirus 2 Ag Negative Negative    Assessment/Plan: APRILL BANKO is a 73 y.o. female present for OV for  Shortness of breath (Primary) - POC COVID-19 BinaxNow-negative -Lung exam normal  Periumbilical abdominal pain-abdominal bloating Patient has a history of significant diverticulosis, chronic GERD with reports of worsening epigastric, periumbilical and left-sided abdominal pain with chills and decreased appetite.  We discussed needing to move forward with CT or for evaluation to rule out either hiatal or umbilical hernia entrapment, diverticulitis, pancreatitis, gastritis or colitis as potential cause of  symptoms. Increase omeprazole  to 40 mg daily for the next 2 weeks. Start Carafate  before meals and before bed. Will wait on lab results and CT result before considering antibiotic start.  We discussed the possibility of viral gastroenteritis as possible cause in which antibiotics would not be effective.  Currently patient's vital signs are stable and she is tolerating p.o., despite no appetite. - CBC w/Diff - Comp Met (CMET) - C-reactive protein - Lipase - CT ABDOMEN PELVIS W CONTRAST; Future   Reviewed expectations re: course of current medical issues. Discussed self-management of symptoms. Outlined signs and symptoms indicating need for more acute intervention. Patient verbalized understanding and all questions were answered. Patient received an After-Visit Summary.    Orders Placed This Encounter  Procedures   CT ABDOMEN PELVIS W CONTRAST   CBC w/Diff   Comp Met (CMET)   C-reactive protein   Lipase   POC COVID-19 BinaxNow   Meds ordered this encounter  Medications   omeprazole  (PRILOSEC) 40 MG capsule    Sig: Take 1 capsule (40 mg total) by mouth daily.    Dispense:  30 capsule    Refill:  3   sucralfate  (CARAFATE ) 1 g tablet    Sig: Take 1 tablet (1 g total) by mouth 4 (four) times daily -  with meals and at bedtime.    Dispense:  120 tablet    Refill:  0   Referral Orders  No referral(s) requested today     Note is dictated utilizing voice recognition software. Although note has been proof read prior to signing, occasional typographical errors still can be missed. If any questions arise, please do not hesitate to call for verification.   electronically signed by:  Charlies Bellini, DO   [facility] - [place]

## 2024-07-19 NOTE — Patient Instructions (Addendum)
 Return in about 2 weeks (around 08/02/2024) for Abdominal pain.        Great to see you today.  I have refilled the medication(s) we provide.   If labs were collected or images ordered, we will inform you of  results once we have received them and reviewed. We will contact you either by echart message, or telephone call.  Please give ample time to the testing facility, and our office to run,  receive and review results. Please do not call inquiring of results, even if you can see them in your chart. We will contact you as soon as we are able. If it has been over 1 week since the test was completed, and you have not yet heard from us , then please call us .    - echart message- for normal results that have been seen by the patient already.   - telephone call: abnormal results or if patient has not viewed results in their echart.  If a referral to a specialist was entered for you, please call us  in 2 weeks if you have not heard from the specialist office to schedule.

## 2024-07-19 NOTE — Telephone Encounter (Signed)
 Please call patient Her abdominal CT is reassuring and essentially normal with the exception of a small left kidney stone that would not be causing her symptoms.  The kidney stone is small and not moving, its inside the kidney and probably has been present for a while.  Labs are pending and we will call her once we have all those results as well.

## 2024-08-20 ENCOUNTER — Encounter: Payer: Self-pay | Admitting: Family

## 2024-08-20 ENCOUNTER — Ambulatory Visit: Payer: Self-pay

## 2024-08-20 ENCOUNTER — Ambulatory Visit (INDEPENDENT_AMBULATORY_CARE_PROVIDER_SITE_OTHER): Admitting: Family

## 2024-08-20 DIAGNOSIS — K449 Diaphragmatic hernia without obstruction or gangrene: Secondary | ICD-10-CM | POA: Diagnosis not present

## 2024-08-20 DIAGNOSIS — K219 Gastro-esophageal reflux disease without esophagitis: Secondary | ICD-10-CM | POA: Diagnosis not present

## 2024-08-20 DIAGNOSIS — R140 Abdominal distension (gaseous): Secondary | ICD-10-CM

## 2024-08-20 MED ORDER — ESOMEPRAZOLE MAGNESIUM 20 MG PO CPDR: 20.0000 mg | DELAYED_RELEASE_CAPSULE | Freq: Every day | ORAL | 1 refills | Status: DC

## 2024-08-20 NOTE — Telephone Encounter (Signed)
Pt scheduled for OV today.  

## 2024-08-20 NOTE — Telephone Encounter (Signed)
 FYI Only or Action Required?: FYI only for provider.  Patient was last seen in primary care on 07/19/2024 by Catherine Fuller A, DO.  Called Nurse Triage reporting Dysphagia. - difficulty swallowing, Thinks this may be a hernia.  Symptoms began Ongoing - but getting worse.  Interventions attempted: Other: seen at office - prescribed medications.  Symptoms are: gradually worsening.  Triage Disposition: See PCP Within 2 Weeks  Patient/caregiver understands and will follow disposition?: Yes              Copied from CRM [id number]. Topic: Clinical - Red Word Triage >> Aug 20, 2024 10:09 AM Rosina BIRCH wrote: Reason for RMF:amzjuypwh issues, swallowing, cough and reflex Reason for Disposition  Swallowing difficulty is a chronic symptom (recurrent or ongoing AND present > 4 weeks)  Answer Assessment - Initial Assessment Questions 1. DESCRIPTION: Tell me more about this problem. Are you  having trouble swallowing liquids, solids, or both? Any trouble with swallowing saliva (spit)?     Feels like something is stuck in her throat 2. SEVERITY: How bad is the swallowing difficulty?  (Scale 1-10; or mild, moderate, severe)     Mild moderate 3. ONSET: When did the swallowing problems begin?      ongoing 4. CAUSE: What do you think is causing the problem?  (e.g., dry mouth, food or pill stuck in throat, mouth pain, sore throat, progression of disease process such as dementia or Parkinson's disease).      hiatal hernia - reflex 5. CHRONIC or RECURRENT: Is this a new problem for you?  If No, ask: How long have you had this problem? (e.g., days, weeks, months)      chronic 6. OTHER SYMPTOMS: Do you have any other symptoms? (e.g., chest pain, difficulty breathing, mouth sores, sore throat, swollen tongue, chest pain)     Chest discomfort  Protocols used: Swallowing Difficulty-A-AH

## 2024-08-20 NOTE — Progress Notes (Signed)
 Acute Office Visit  Subjective:     Patient ID: Jenny Hicks, female    DOB: 1951/04/26, 73 y.o.   MRN: [id number]  Chief Complaint  Patient presents with  . Difficulty Swallowing    Pt weaned herself off Omeprazole  40mg ; productive cough with white phlegm, belching a lot. Has hiatal hernia (2022)    HPI Patient is in today with complaints of heartburn, indigestion, increased burping, cough and feeling bloated and full over the past several weeks.  She has a history of a hiatal hernia that was diagnosed in 2022.  Reports feeling like she was having an allergic reaction to omeprazole  40 mg and has since weaned herself off.  Reports omeprazole  caused her to have body aches. Since discontinuing, the symptoms have resolved but the reflux has returned.  Denies any increase in stress.  Reports traveling a lot.  However, tries to watch her diet and eliminate spicy foods, tomato paste, and onions.  These are her typical triggers.  Review of Systems  Respiratory:  Positive for cough. Negative for shortness of breath.   Cardiovascular: Negative.   Gastrointestinal:  Positive for abdominal pain. Negative for constipation and vomiting.       Abdominal bloating  Musculoskeletal: Negative.   Neurological: Negative.   Psychiatric/Behavioral: Negative.    All other systems reviewed and are negative.  Past Medical History:  Diagnosis Date  . Abdominal pain 10/25/2022  . Benign cyst of kidney   . Bone spur of right foot   . Chicken pox   . Colitis 01/06/2021  . DDD (degenerative disc disease), cervical   . Diverticulitis 10/06/2020   treatment given in ER  . GERD (gastroesophageal reflux disease)   . Heart palpitations   . History of nuclear stress test 03/2013   stress myoview ; normal study; SVT (HR 200) - adenosine  . Kidney stone 10/15/2021  . 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 Mitral valve prolapse    a. 03/2013 Echo: EF 55-60%, mild MVP, mild to mod MR.  . Osteopenia 2017  . Radial  head fracture, closed 11/27/2018   11/2018 Referred to ortho Plan: Counseled the patient that she had a nondisplaced radial neck fracture and arthritic changes about her wrist.  Recommended continued compression for symptomatic improvement.  She can continue Advil as needed for pain.  Recommend no lifting more than 5 pounds for another 3 weeks and then gradual increase to tolerance.  She can move the wrist and the elbow to tolerance  . Scoliosis   . SVT (supraventricular tachycardia)    a. 03/2013 occurred during ex MV-->resolved with adenosine and carotid massage.    Social History   Socioeconomic History  . Marital status: Married    Spouse name: Marcey  . Number of children: 5  . Years of education: 16  . Highest education level: Bachelor's degree (e.g., BA, AB, BS)  Occupational History  . Occupation: retired Charity fundraiser  Tobacco Use  . Smoking status: Never  . Smokeless tobacco: Never  Vaping Use  . Vaping status: Never Used  Substance and Sexual Activity  . Alcohol use: No  . Drug use: No  . Sexual activity: Yes    Partners: Male    Birth control/protection: Surgical    Comment: married  Other Topics Concern  . Not on file  Social History Narrative   Married to Courtland, they have 5 children.    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   Drinks caffeine, takes a daily vitamin   Wears seatbelt, smoke detector in the home, no firearms in the home.    Feels safe in her relationships.    One floor home   Right handed   One cup of coffee daily   13 grandchildren      Social Drivers of Health   Financial Resource Strain: Low Risk  (04/20/2024)   Overall Financial Resource Strain (CARDIA)   . Difficulty of Paying Living Expenses: Not very hard  Food Insecurity: No Food Insecurity (04/20/2024)   Hunger Vital Sign   . Worried About Programme researcher, broadcasting/film/video in the Last Year: Never true   . Ran Out of Food in the Last Year: Never true  Transportation Needs: No  Transportation Needs (04/20/2024)   PRAPARE - Transportation   . Lack of Transportation (Medical): No   . Lack of Transportation (Non-Medical): No  Physical Activity: Insufficiently Active (04/20/2024)   Exercise Vital Sign   . Days of Exercise per Week: 5 days   . Minutes of Exercise per Session: 20 min  Stress: No Stress Concern Present (04/20/2024)   [employer] of Occupational Health - Occupational Stress Questionnaire   . Feeling of Stress: Not at all  Social Connections: Moderately Integrated (04/20/2024)   Social Connection and Isolation Panel   . Frequency of Communication with Friends and Family: More than three times a week   . Frequency of Social Gatherings with Friends and Family: More than three times a week   . Attends Religious Services: More than 4 times per year   . Active Member of Clubs or Organizations: No   . Attends Banker Meetings: Not on file   . Marital Status: Married  Catering manager Violence: Not At Risk (10/26/2023)   Humiliation, Afraid, Rape, and Kick questionnaire   . Fear of Current or Ex-Partner: No   . Emotionally Abused: No   . Physically Abused: No   . Sexually Abused: No    Past Surgical History:  Procedure Laterality Date  . bladder prolapse  2022  . COLONOSCOPY  01/12/2011  . DIAGNOSTIC MAMMOGRAM  08/06/2014   digital  . TRANSTHORACIC ECHOCARDIOGRAM  03/2013   EF 55-60%, mild conc hypertrophy; mild MVP, mild-mod MR  . UMBILICAL HERNIA REPAIR  03/02/2021   with colprpex  . VAGINAL HYSTERECTOMY  1989   partial; pt has both ovaries    Family History  Problem Relation Age of Onset  . Heart attack Mother   . Hypertension Mother   . Stroke Mother   . Breast cancer Mother   . Osteoporosis Mother        cervical/femur fracture  . Kidney disease Mother   . Alzheimer's disease Mother   . Atrial fibrillation Mother   . Myasthenia gravis Father   . Prostate cancer Father   . Prostate cancer Brother   . Early death  Maternal Grandmother        died after 21-Aug-2024 baby  . Heart disease Maternal Grandfather   . Congestive Heart Failure Maternal Grandfather   . Ovarian cancer Paternal Grandmother   . Other Daughter        mitral valve damaged had to be repaired, cause unknown  . Arthritis Maternal Aunt   . Arthritis Maternal Uncle   . Prostate cancer Maternal Uncle   . Breast cancer Cousin   . Colon cancer Neg Hx   . Esophageal cancer Neg Hx   . Rectal cancer  Neg Hx   . Colon polyps Neg Hx   . Stomach cancer Neg Hx     Allergies  Allergen Reactions  . Hydrocodone-Acetaminophen Other (See Comments)    Made her BP go way up  . Pantoprazole  Sodium Palpitations    Current Outpatient Medications on File Prior to Visit  Medication Sig Dispense Refill  . Calcium Carb-Cholecalciferol (CALCIUM 600 + D PO) Take 600 mg by mouth daily. Vit D 800 units    . Cobalamin Combinations (B-12) 1000-400 MCG SUBL Place under the tongue once. Takes dropper once a day    . memantine  (NAMENDA ) 5 MG tablet Take 1 tablet (5 mg total) by mouth at bedtime. 90 tablet 4  . metoprolol  succinate (TOPROL -XL) 25 MG 24 hr tablet Take 1 tablet by mouth once daily 90 tablet 1  . Multiple Vitamins-Minerals (CENTRUM MULTIGUMMIES PO) Take by mouth.    . Vitamin D , Cholecalciferol, 25 MCG (1000 UT) TABS Take 2,000 Units by mouth. 2,000 units 3x a week     No current facility-administered medications on file prior to visit.    BP 121/71   Pulse 75   Temp 98 F (36.7 C) (Temporal)   Ht 5' 4 (1.626 m)   Wt 152 lb 12.8 oz (69.3 kg)   SpO2 99%   BMI 26.23 kg/m chart      Objective:    BP 121/71   Pulse 75   Temp 98 F (36.7 C) (Temporal)   Ht 5' 4 (1.626 m)   Wt 152 lb 12.8 oz (69.3 kg)   SpO2 99%   BMI 26.23 kg/m    Physical Exam Vitals and nursing note reviewed.  Constitutional:      Appearance: Normal appearance. She is normal weight.  Cardiovascular:     Rate and Rhythm: Normal rate and regular rhythm.   Pulmonary:     Effort: Pulmonary effort is normal.     Breath sounds: Normal breath sounds.  Abdominal:     General: Abdomen is flat. Bowel sounds are normal.     Palpations: Abdomen is soft.     Tenderness: There is abdominal tenderness. There is no guarding or rebound.     Comments: Epigastric tenderness noted  Musculoskeletal:        General: Normal range of motion.     Cervical back: Normal range of motion and neck supple.  Skin:    General: Skin is warm and dry.  Neurological:     General: No focal deficit present.     Mental Status: She is alert and oriented to person, place, and time. Mental status is at baseline.  Psychiatric:        Mood and Affect: Mood normal.        Behavior: Behavior normal.    No results found for any visits on 08/20/24.      Assessment & Plan:   Problem List Items Addressed This Visit     Hiatal hernia with gastroesophageal reflux   Relevant Medications   esomeprazole (NEXIUM) 20 MG capsule   Other Relevant Orders   Ambulatory referral to Gastroenterology   Other Visit Diagnoses       Gastroesophageal reflux disease without esophagitis       Relevant Medications   esomeprazole (NEXIUM) 20 MG capsule   Other Relevant Orders   Ambulatory referral to Gastroenterology     Abdominal bloating       Relevant Orders   Ambulatory referral to Gastroenterology  Meds ordered this encounter  Medications  . esomeprazole (NEXIUM) 20 MG capsule    Sig: Take 1 capsule (20 mg total) by mouth daily.    Dispense:  30 capsule    Refill:  1   Follow-up with GI for possible endoscopy and evaluation of the hiatal hernia.  DC omeprazole .  Reports tolerating Nexium in the past well.  Therefore, we will start Nexium 20 mg once daily.  Alkaline water daily.  Call the office if symptoms worsen or persist. No follow-ups on file.  Mahdi Frye B Arlethia Basso, FNP

## 2024-08-22 NOTE — Telephone Encounter (Signed)
 No further action needed at this time.

## 2024-08-24 ENCOUNTER — Telehealth: Payer: Self-pay

## 2024-08-24 ENCOUNTER — Telehealth: Admitting: Physician Assistant

## 2024-08-24 DIAGNOSIS — R3989 Other symptoms and signs involving the genitourinary system: Secondary | ICD-10-CM

## 2024-08-24 MED ORDER — SULFAMETHOXAZOLE-TRIMETHOPRIM 800-160 MG PO TABS: 1.0000 | ORAL_TABLET | Freq: Two times a day (BID) | ORAL | 0 refills | Status: DC

## 2024-08-24 NOTE — Progress Notes (Signed)
 Virtual Visit Consent   [name], you are scheduled for a virtual visit with a [facility] provider today. Just as with appointments in the office, your consent must be obtained to participate. Your consent will be active for this visit and any virtual visit you may have with one of our providers in the next 365 days. If you have a MyChart account, a copy of this consent can be sent to you electronically.  As this is a virtual visit, video technology does not allow for your provider to perform a traditional examination. This may limit your provider's ability to fully assess your condition. If your provider identifies any concerns that need to be evaluated in person or the need to arrange testing (such as labs, EKG, etc.), we will make arrangements to do so. Although advances in technology are sophisticated, we cannot ensure that it will always work on either your end or our end. If the connection with a video visit is poor, the visit may have to be switched to a telephone visit. With either a video or telephone visit, we are not always able to ensure that we have a secure connection.  By engaging in this virtual visit, you consent to the provision of healthcare and authorize for your insurance to be billed (if applicable) for the services provided during this visit. Depending on your insurance coverage, you may receive a charge related to this service.  I need to obtain your verbal consent now. Are you willing to proceed with your visit today? [name] has provided verbal consent on 08/24/2024 for a virtual visit (video or telephone). Delon CHRISTELLA Dickinson, PA-C  Date: 08/24/2024 4:39 PM   Virtual Visit via Video Note   I, Delon CHRISTELLA Dickinson, connected with  Jenny Hicks  ([id number], 06/19/1951) on 08/24/24 at  4:30 PM EDT by a video-enabled telemedicine application and verified that I am speaking with the correct person using two identifiers.  Location: Patient: Virtual Visit  Location Patient: Home Provider: Virtual Visit Location Provider: Home Office   I discussed the limitations of evaluation and management by telemedicine and the availability of in person appointments. The patient expressed understanding and agreed to proceed.    History of Present Illness: Jenny Hicks is a 73 y.o. who identifies as a female who was assigned female at birth, and is being seen today for UTI positive home test.  HPI: Urinary Tract Infection  This is a recurrent problem. The current episode started in the past 7 days. The problem occurs every urination. The problem has been gradually worsening. The quality of the pain is described as burning. The pain is mild. There has been no fever. Associated symptoms include chills, nausea (has had issues chronically, undergoing work up with PCP) and sweats. Pertinent negatives include no flank pain, frequency, hematuria, hesitancy or urgency. Associated symptoms comments: General unwell. She has tried increased fluids for the symptoms. The treatment provided no relief.  Positive at home UTI test for Leukocytes   Problems:  Patient Active Problem List   Diagnosis Date Noted   B12 deficiency 09/27/2023   Family history of Alzheimer's disease-mother 09/27/2023   Primary osteoarthritis involving multiple joints 09/26/2023   Mild cognitive impairment 09/26/2023   Hiatal hernia with gastroesophageal reflux 10/25/2022   Cyst of right kidney 01/06/2021   Vitamin D  deficiency 12/15/2017   Hyperlipidemia 09/17/2016   Paroxysmal SVT (supraventricular tachycardia) (HCC) 09/16/2016   Osteopenia 09/16/2016   Lown Dane Dan syndrome 08/23/2013  MVP (mitral valve prolapse) 08/23/2013   Mitral regurgitation 08/23/2013    Allergies:  Allergies  Allergen Reactions   Hydrocodone-Acetaminophen Other (See Comments)    Made her BP go way up   Pantoprazole  Sodium Palpitations   Medications:  Current Outpatient Medications:     sulfamethoxazole-trimethoprim (BACTRIM DS) 800-160 MG tablet, Take 1 tablet by mouth 2 (two) times daily., Disp: 10 tablet, Rfl: 0   Calcium Carb-Cholecalciferol (CALCIUM 600 + D PO), Take 600 mg by mouth daily. Vit D 800 units, Disp: , Rfl:    Cobalamin Combinations (B-12) 1000-400 MCG SUBL, Place under the tongue once. Takes dropper once a day, Disp: , Rfl:    esomeprazole (NEXIUM) 20 MG capsule, Take 1 capsule (20 mg total) by mouth daily., Disp: 30 capsule, Rfl: 1   memantine  (NAMENDA ) 5 MG tablet, Take 1 tablet (5 mg total) by mouth at bedtime., Disp: 90 tablet, Rfl: 4   metoprolol  succinate (TOPROL -XL) 25 MG 24 hr tablet, Take 1 tablet by mouth once daily, Disp: 90 tablet, Rfl: 1   Multiple Vitamins-Minerals (CENTRUM MULTIGUMMIES PO), Take by mouth., Disp: , Rfl:    Vitamin D , Cholecalciferol, 25 MCG (1000 UT) TABS, Take 2,000 Units by mouth. 2,000 units 3x a week, Disp: , Rfl:   Observations/Objective: Patient is well-developed, well-nourished in no acute distress.  Resting comfortably at home.  Head is normocephalic, atraumatic.  No labored breathing.  Speech is clear and coherent with logical content.  Patient is alert and oriented at baseline.    Assessment and Plan: 1. Suspected UTI (Primary) - sulfamethoxazole-trimethoprim (BACTRIM DS) 800-160 MG tablet; Take 1 tablet by mouth 2 (two) times daily.  Dispense: 10 tablet; Refill: 0  - Worsening symptoms.  - Will treat empirically with Bactrim - May use AZO for bladder spasms - Continue to push fluids.  - Seek in person evaluation for urine culture if symptoms do not improve or if they worsen.    Follow Up Instructions: I discussed the assessment and treatment plan with the patient. The patient was provided an opportunity to ask questions and all were answered. The patient agreed with the plan and demonstrated an understanding of the instructions.  A copy of instructions were sent to the patient via MyChart unless otherwise  noted below.    The patient was advised to call back or seek an in-person evaluation if the symptoms worsen or if the condition fails to improve as anticipated.    Delon CHRISTELLA Dickinson, PA-C

## 2024-08-24 NOTE — Telephone Encounter (Signed)
 Copied from CRM #[id number]. Topic: Clinical - Medication Question >> Aug 24, 2024  4:12 PM Aisha D wrote: Reason for CRM: Pt stated that she tested herself for a UTI and it came back positive. Pt stated that she isn't experiencing burning or pain but does feel a little weak and under the weather. Pt stated that she is currently out of town in [place] and wanted to have some antibiotics sent to the [facility] in [place] today before the office closes. Pt would like a callback today with an update.   Pt completed virtual visit with another provider  and was sent antibiotics.

## 2024-08-24 NOTE — Patient Instructions (Signed)
 Rudell GORMAN Pepper, thank you for joining Delon CHRISTELLA Dickinson, PA-C for today's virtual visit.  While this provider is not your primary care provider (PCP), if your PCP is located in our provider database this encounter information will be shared with them immediately following your visit.   A [facility] MyChart account gives you access to today's visit and all your visits, tests, and labs performed at [facility]  click here if you don't have a [facility] MyChart account or go to [email or web address]  Consent: (Patient) Jenny Hicks provided verbal consent for this virtual visit at the beginning of the encounter.  Current Medications:  Current Outpatient Medications:    sulfamethoxazole-trimethoprim (BACTRIM DS) 800-160 MG tablet, Take 1 tablet by mouth 2 (two) times daily., Disp: 10 tablet, Rfl: 0   Calcium Carb-Cholecalciferol (CALCIUM 600 + D PO), Take 600 mg by mouth daily. Vit D 800 units, Disp: , Rfl:    Cobalamin Combinations (B-12) 1000-400 MCG SUBL, Place under the tongue once. Takes dropper once a day, Disp: , Rfl:    esomeprazole (NEXIUM) 20 MG capsule, Take 1 capsule (20 mg total) by mouth daily., Disp: 30 capsule, Rfl: 1   memantine  (NAMENDA ) 5 MG tablet, Take 1 tablet (5 mg total) by mouth at bedtime., Disp: 90 tablet, Rfl: 4   metoprolol  succinate (TOPROL -XL) 25 MG 24 hr tablet, Take 1 tablet by mouth once daily, Disp: 90 tablet, Rfl: 1   Multiple Vitamins-Minerals (CENTRUM MULTIGUMMIES PO), Take by mouth., Disp: , Rfl:    Vitamin D , Cholecalciferol, 25 MCG (1000 UT) TABS, Take 2,000 Units by mouth. 2,000 units 3x a week, Disp: , Rfl:    Medications ordered in this encounter:  Meds ordered this encounter  Medications   sulfamethoxazole-trimethoprim (BACTRIM DS) 800-160 MG tablet    Sig: Take 1 tablet by mouth 2 (two) times daily.    Dispense:  10 tablet    Refill:  0    Supervising Provider:   BLAISE ALEENE KIDD [[id number]]     *If you need refills on  other medications prior to your next appointment, please contact your pharmacy*  Follow-Up: Call back or seek an in-person evaluation if the symptoms worsen or if the condition fails to improve as anticipated.  [facility] [phone]  Other Instructions Urinary Tract Infection, Female A urinary tract infection (UTI) is an infection in your urinary tract. The urinary tract is made up of organs that make, store, and get rid of pee (urine) in your body. These organs include: The kidneys. The ureters. The bladder. The urethra. What are the causes? Most UTIs are caused by germs called bacteria. They may be in or near your genitals. These germs grow and cause swelling in your urinary tract. What increases the risk? You're more likely to get a UTI if: You're a female. The urethra is shorter in females than in males. You have a soft tube called a catheter that drains your pee. You can't control when you pee or poop. You have trouble peeing because of: A kidney stone. A urinary blockage. A nerve condition that affects your bladder. Not getting enough to drink. You're sexually active. You use a birth control inside your vagina, like spermicide. You're pregnant. You have low levels of the hormone estrogen in your body. You're an older adult. You're also more likely to get a UTI if you have other health problems. These may include: Diabetes. A weak immune system. Your immune system is your body's defense  system. Sickle cell disease. Injury of the spine. What are the signs or symptoms? Symptoms may include: Needing to pee right away. Peeing small amounts often. Pain or burning when you pee. Blood in your pee. Pee that smells bad or odd. Pain in your belly or lower back. You may also: Feel confused. This may be the first symptom in older adults. Vomit. Not feel hungry. Feel tired or easily annoyed. Have a fever or chills. How is this diagnosed? A UTI is diagnosed  based on your medical history and an exam. You may also have other tests. These may include: Pee tests. Blood tests. Tests for sexually transmitted infections (STIs). If you've had more than one UTI, you may need to have imaging studies done to find out why you keep getting them. How is this treated? A UTI can be treated by: Taking antibiotics or other medicines. Drinking enough fluid to keep your pee pale yellow. In rare cases, a UTI can cause a very bad condition called sepsis. Sepsis may be treated in the hospital. Follow these instructions at home: Medicines Take your medicines only as told by your health care provider. If you were given antibiotics, take them as told by your provider. Do not stop taking them even if you start to feel better. General instructions Make sure you: Pee often and fully. Do not hold your pee for a long time. Wipe from front to back after you pee or poop. Use each tissue only once when you wipe. Pee after you have sex. Do not douche or use sprays or powders in your genital area. Contact a health care provider if: Your symptoms don't get better after 1-2 days of taking antibiotics. Your symptoms go away and then come back. You have a fever or chills. You vomit or feel like you may vomit. Get help right away if: You have very bad pain in your back or lower belly. You faint. This information is not intended to replace advice given to you by your health care provider. Make sure you discuss any questions you have with your health care provider. Document Revised: 10/05/2023 Document Reviewed: 01/28/2023 Elsevier Patient Education  [employer].   If you have been instructed to have an in-person evaluation today at a local Urgent Care facility, please use the link below. It will take you to a list of all of our available [facility], including address, phone number and hours of operation. Please do not delay care.  [facility]  If you or a family member do not have a primary care provider, use the link below to schedule a visit and establish care. When you choose a [facility] primary care physician or advanced practice provider, you gain a long-term partner in health. Find a Primary Care Provider  Learn more about [facility]'s in-office and virtual care options:  - Get Care Now

## 2024-08-30 ENCOUNTER — Encounter: Payer: Self-pay | Admitting: Gastroenterology

## 2024-08-30 ENCOUNTER — Ambulatory Visit: Admitting: Gastroenterology

## 2024-08-30 DIAGNOSIS — K219 Gastro-esophageal reflux disease without esophagitis: Secondary | ICD-10-CM

## 2024-08-30 DIAGNOSIS — R1013 Epigastric pain: Secondary | ICD-10-CM

## 2024-08-30 DIAGNOSIS — R072 Precordial pain: Secondary | ICD-10-CM

## 2024-08-30 DIAGNOSIS — R1319 Other dysphagia: Secondary | ICD-10-CM | POA: Diagnosis not present

## 2024-08-30 DIAGNOSIS — R634 Abnormal weight loss: Secondary | ICD-10-CM

## 2024-08-30 NOTE — Patient Instructions (Addendum)
 VISIT SUMMARY:  During your visit, we discussed your ongoing issues with bloating, swallowing difficulties, and heartburn, as well as your recent unintentional weight loss. We have planned further evaluations to better understand and address these symptoms.  YOUR PLAN:  DYSPHAGIA WITH GASTROESOPHAGEAL REFLUX DISEASE AND HIATAL HERNIA: You have been experiencing difficulty swallowing, a sensation of food sticking, and bloating. Your heartburn has slightly improved with medication, but some symptoms persist. -We will schedule an endoscopy to check for ulcers, infection, gastritis, esophagitis, or strictures. -Continue taking Nexium 20 mg once daily. -If your symptoms persist, we may increase Nexium to 20 mg twice daily based on EGD findings   UNINTENTIONAL WEIGHT LOSS: You have lost 10 pounds since September 11th, which is concerning and needs further investigation. -We will investigate further if EGD unrevealing  You have been scheduled for an endoscopy. Please follow written instructions given to you at your visit today.  If you use inhalers (even only as needed), please bring them with you on the day of your procedure.  If you take any of the following medications, they will need to be adjusted prior to your procedure:   DO NOT TAKE 7 DAYS PRIOR TO TEST- Trulicity (dulaglutide) Ozempic, Wegovy (semaglutide) Mounjaro (tirzepatide) Bydureon Bcise (exanatide extended release)  DO NOT TAKE 1 DAY PRIOR TO YOUR TEST Rybelsus (semaglutide) Adlyxin (lixisenatide) Victoza (liraglutide) Byetta (exanatide) ___________________________________________________________________________    Due to recent changes in healthcare laws, you may see the results of your imaging and laboratory studies on MyChart before your provider has had a chance to review them.  We understand that in some cases there may be results that are confusing or concerning to you. Not all laboratory results come back in the  same time frame and the provider may be waiting for multiple results in order to interpret others.  Please give us  48 hours in order for your provider to thoroughly review all the results before contacting the office for clarification of your results.    I appreciate the  opportunity to care for you  Thank You   Kavitha Nandigam , MD

## 2024-08-30 NOTE — Progress Notes (Signed)
 Jenny Hicks    [id number]    Apr 14, 1951  Primary Care Physician:Kuneff, Charlies LABOR, DO  Referring Physician: Catherine Charlies LABOR, DO [address] [place],  [place] [address]   Chief complaint:  GERD, bloated, dysphagia, cough  Discussed the use of AI scribe software for clinical note transcription with the patient, who gave verbal consent to proceed.  History of Present Illness Jenny Hicks is a 73 year old female with a history of hiatal hernia who presents with worsening bloating and swallowing difficulties.  Gastrointestinal symptoms - Bloating and swallowing difficulties since July 19, 2024 - Bloating initially severe, now slightly improved but persistent - Bloating causes discomfort with tight clothing and frequent belching - Dysphagia described as a 'thick' sensation, with pills and food sometimes not going down properly, requiring fluid intake to aid swallowing - No choking or waking up at night with a sensation of drowning - Daily bowel movements, especially after drinking coffee   Gastroesophageal reflux and hiatal hernia - History of hiatal hernia - Persistent heartburn despite Nexium (20 mg once daily); heartburn slightly improved compared to when she was taking omeprazole  - Previous heartburn episodes severe enough to cause flu-like symptoms, including body aches and reluctance to move - Sleeping with head elevated alleviates symptoms, which are worse when lying flat  Unintentional weight loss and appetite changes - Approximately 10-pound weight loss since July 19, 2024 (from 160 to 150 pounds) - Attributed to lack of appetite and not feeling hungry  Musculoskeletal pain - Osteoarthritis, particularly affecting both hips - Bone pain, especially in the chest area  Imaging and recent evaluations - Abdominal and pelvic CT scan in September showed a small kidney stone but no other concerning findings - No recent chest CT scan  Functional  status - Maintains an active lifestyle, including walking and household activities, denies any exertional dyspnea or chest pain  CT abdomen & pelvis with contrast 1. Small nonobstructive left renal calculus. No hydronephrosis or renal obstruction is noted. 2. No other abnormality seen in the abdomen or pelvis.  Colonoscopy April 23, 2024 - Two 4 to 7 mm polyps in the sigmoid colon and in the transverse colon, removed with a cold snare. Resected and retrieved. - Diverticulosis in the sigmoid colon and in the descending colon. - Non- bleeding external and internal hemorrhoids.  1. Surgical [P], colon, transverse x 1, sigmoid x 1, polyp (2) :       FRAGMENTS OF TUBULAR ADENOMA.       FRAGMENT OF COLONIC MUCOSA WITH LYMPHOID AGGREGATES.       NEGATIVE FOR HIGH-GRADE DYSPLASIA.    Colonoscopy April 16, 2021 - One less than 1 mm polyp in the ascending colon, removed with a cold biopsy forceps. Resected and retrieved. - Four 4 to 11 mm polyps in the sigmoid colon and in the ascending colon, removed with a cold snare. Resected and retrieved. - Diverticulosis in the sigmoid colon - Non- bleeding external and internal hemorrhoids.  1. Surgical [P], ascending colon polyps x 3 cold biopsy and snare - TUBULAR ADENOMA (X3 FRAGMENTS). - INFLAMMATORY POLYP. - NO HIGH GRADE DYSPLASIA OR MALIGNANCY. 2. Surgical [P], colon, sigmoid x 2 cold biopsy, cold snare - TUBULAR ADENOMA (X2 FRAGMENTS). - NO HIGH GRADE DYSPLASIA OR MALIGNANCY.  EGD August 09, 2017 - Normal mucosa was found in the entire esophagus. Biopsied. - Small hiatal hernia. - Multiple, benign, gastric polyps. - Otherwise normal stomach  mucosa. - Normal examined duodenum. Surgical [P], distal esophagus and proximal esophagus - BENIGN SQUAMOUS MUCOSA. - THERE IS NO EVIDENCE OF INCREASE IN EOSINOPHILS, DYSPLASIA, OR MALIGNANCY.  Outpatient Encounter Medications as of 08/30/2024  Medication Sig   Calcium Carb-Cholecalciferol (CALCIUM 600 + D  PO) Take 600 mg by mouth daily. Vit D 800 units   Cobalamin Combinations (B-12) 1000-400 MCG SUBL Place under the tongue once. Takes dropper once a day   esomeprazole (NEXIUM) 20 MG capsule Take 1 capsule (20 mg total) by mouth daily.   memantine  (NAMENDA ) 5 MG tablet Take 1 tablet (5 mg total) by mouth at bedtime.   metoprolol  succinate (TOPROL -XL) 25 MG 24 hr tablet Take 1 tablet by mouth once daily   Multiple Vitamins-Minerals (CENTRUM MULTIGUMMIES PO) Take by mouth.   sulfamethoxazole-trimethoprim (BACTRIM DS) 800-160 MG tablet Take 1 tablet by mouth 2 (two) times daily.   Vitamin D , Cholecalciferol, 25 MCG (1000 UT) TABS Take 2,000 Units by mouth. 2,000 units 3x a week   No facility-administered encounter medications on file as of 08/30/2024.    Allergies as of 08/30/2024 - Review Complete 08/24/2024  Allergen Reaction Noted   Hydrocodone-acetaminophen Other (See Comments) 08/02/2017   Pantoprazole  sodium Palpitations 09/11/2019    Past Medical History:  Diagnosis Date   Abdominal pain 10/25/2022   Benign cyst of kidney    Bone spur of right foot    Chicken pox    Colitis 01/06/2021   DDD (degenerative disc disease), cervical    Diverticulitis 10/06/2020   treatment given in ER   GERD (gastroesophageal reflux disease)    Heart palpitations    History of nuclear stress test 03/2013   stress myoview ; normal study; SVT (HR 200) - adenosine   Kidney stone 10/15/2021   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 03/2013 Echo: EF 55-60%, mild MVP, mild to mod MR.   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  Recommended continued compression for symptomatic improvement.  She can continue Advil as needed for pain.  Recommend no lifting more than 5 pounds for another 3 weeks and then gradual increase to  tolerance.  She can move the wrist and the elbow to tolerance   Scoliosis    SVT (supraventricular tachycardia)    a. 03/2013 occurred during ex MV-->resolved with adenosine and carotid massage.    Past Surgical History:  Procedure Laterality Date   bladder prolapse  2022   COLONOSCOPY  01/12/2011   DIAGNOSTIC MAMMOGRAM  08/06/2014   digital   TRANSTHORACIC ECHOCARDIOGRAM  03/2013   EF 55-60%, mild conc hypertrophy; mild MVP, mild-mod MR   UMBILICAL HERNIA REPAIR  03/02/2021   with colprpex   VAGINAL HYSTERECTOMY  1989   partial; pt has both ovaries    Family History  Problem Relation Age of Onset   Heart attack Mother    Hypertension Mother    Stroke Mother    Breast cancer Mother    Osteoporosis Mother        cervical/femur fracture   Kidney disease Mother    Alzheimer's disease Mother    Atrial fibrillation Mother    Myasthenia gravis Father    Prostate cancer Father    Prostate cancer Brother    Early death Maternal Grandmother  died after 12th baby   Heart disease Maternal Grandfather    Congestive Heart Failure Maternal Grandfather    Ovarian cancer Paternal Grandmother    Other Daughter        mitral valve damaged had to be repaired, cause unknown   Arthritis Maternal Aunt    Arthritis Maternal Uncle    Prostate cancer Maternal Uncle    Breast cancer Cousin    Colon cancer Neg Hx    Esophageal cancer Neg Hx    Rectal cancer Neg Hx    Colon polyps Neg Hx    Stomach cancer Neg Hx     Social History   Socioeconomic History   Marital status: Married    Spouse name: Marcey   Number of children: 5   Years of education: 16   Highest education level: Bachelor's degree (e.g., BA, AB, BS)  Occupational History   Occupation: retired Charity fundraiser  Tobacco Use   Smoking status: Never   Smokeless tobacco: Never  Vaping Use   Vaping status: Never Used  Substance and Sexual Activity   Alcohol use: No   Drug use: No   Sexual activity: Yes    Partners: Male     Birth control/protection: Surgical    Comment: married  Other Topics Concern   Not on file  Social History Narrative   Married to [place], they have 5 children.    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   Drinks caffeine, takes a daily vitamin   Wears seatbelt, smoke detector in the home, no firearms in the home.    Feels safe in her relationships.    One floor home   Right handed   One cup of coffee daily   13 grandchildren      Social Drivers of Corporate investment banker Strain: Low Risk  (04/20/2024)   Overall Financial Resource Strain (CARDIA)    Difficulty of Paying Living Expenses: Not very hard  Food Insecurity: No Food Insecurity (04/20/2024)   Hunger Vital Sign    Worried About Running Out of Food in the Last Year: Never true    Ran Out of Food in the Last Year: Never true  Transportation Needs: No Transportation Needs (04/20/2024)   PRAPARE - Administrator, Civil Service (Medical): No    Lack of Transportation (Non-Medical): No  Physical Activity: Insufficiently Active (04/20/2024)   Exercise Vital Sign    Days of Exercise per Week: 5 days    Minutes of Exercise per Session: 20 min  Stress: No Stress Concern Present (04/20/2024)   [employer] of Occupational Health - Occupational Stress Questionnaire    Feeling of Stress: Not at all  Social Connections: Moderately Integrated (04/20/2024)   Social Connection and Isolation Panel    Frequency of Communication with Friends and Family: More than three times a week    Frequency of Social Gatherings with Friends and Family: More than three times a week    Attends Religious Services: More than 4 times per year    Active Member of [employer] or Organizations: No    Attends Banker Meetings: Not on file    Marital Status: Married  Intimate Partner Violence: Not At Risk (10/26/2023)   Humiliation, Afraid, Rape, and Kick questionnaire    Fear of Current or Ex-Partner: No     Emotionally Abused: No    Physically Abused: No    Sexually Abused: No  Review of systems: All other review of systems negative except as mentioned in the HPI.   Physical Exam: Vitals:   08/30/24 0939  BP: 110/70  Pulse: 63   Body mass index is 25.75 kg/m. Gen:      No acute distress HEENT:  sclera anicteric CV: s1s2 rrr, no murmur Lungs: B/l clear. Abd:      soft, mild tenderness in epigastric, right lower quadrant and left upper quadrant; no palpable masses, no distension Ext:    No edema Neuro: alert and oriented x 3 Psych: normal mood and affect  Data Reviewed:  Reviewed labs, radiology imaging, old records and pertinent past GI work up   Assessment and Plan Assessment & Plan Dysphagia with gastroesophageal reflux disease and hiatal hernia Experiencing dysphagia since September with difficulty swallowing, sensation of food sticking, and bloating. Heartburn improved with Nexium but some symptoms persist. Concern for esophagitis, esophageal stricture, or infection such as candida esophagitis. Hiatal hernia noted but not a major concern.  - Schedule endoscopy to evaluate for ulcers, infection, gastritis, esophagitis, or strictures. - Continue Nexium 20 mg once daily. - Consider increasing Nexium to 20 mg twice daily if symptoms persist.  Patient is planning to leave for a cruise vacation in first week of November, and my next available appointment for [facility] procedures is in December.  She is concerned about her upcoming vacation, will schedule EGD with Dr. Avram, he has a cancellation for tomorrow   Unintentional weight loss Experienced a 10-pound weight loss since September 11th, associated with decreased appetite and bloating. Weight loss is concerning and warrants further investigation if continues to lose weight  Return in 2 months    This visit required >40 minutes of patient care (this includes precharting, chart review, review of results, face-to-face  time used for counseling as well as treatment plan and follow-up. The patient was provided an opportunity to ask questions and all were answered. The patient agreed with the plan and demonstrated an understanding of the instructions.  Jenny Hicks , MD    CC: Catherine Charlies LABOR, DO

## 2024-08-31 ENCOUNTER — Ambulatory Visit: Admitting: Internal Medicine

## 2024-08-31 ENCOUNTER — Encounter: Payer: Self-pay | Admitting: Internal Medicine

## 2024-08-31 VITALS — BP 123/61 | HR 94 | Temp 97.6°F | Resp 14 | Ht 64.0 in | Wt 150.0 lb

## 2024-08-31 DIAGNOSIS — R1310 Dysphagia, unspecified: Secondary | ICD-10-CM

## 2024-08-31 DIAGNOSIS — K219 Gastro-esophageal reflux disease without esophagitis: Secondary | ICD-10-CM

## 2024-08-31 DIAGNOSIS — R1319 Other dysphagia: Secondary | ICD-10-CM

## 2024-08-31 DIAGNOSIS — K317 Polyp of stomach and duodenum: Secondary | ICD-10-CM | POA: Diagnosis not present

## 2024-08-31 DIAGNOSIS — K3189 Other diseases of stomach and duodenum: Secondary | ICD-10-CM | POA: Diagnosis not present

## 2024-08-31 MED ORDER — SODIUM CHLORIDE 0.9 % IV SOLN: 500.0000 mL | INTRAVENOUS | Status: DC

## 2024-08-31 NOTE — Progress Notes (Signed)
 History and Physical Interval Note:  08/31/2024 11:22 AM  Jenny Hicks  has presented today for endoscopic procedure(s), with the diagnosis of  Encounter Diagnoses  Name Primary?   Gastroesophageal reflux disease, unspecified whether esophagitis present Yes   Esophageal dysphagia   .  The various methods of evaluation and treatment have been discussed with the patient and/or family. After consideration of risks, benefits and other options for treatment, the patient has consented to  the endoscopic procedure(s).   The patient's history has been reviewed, patient examined, no change in status, stable for endoscopic procedure(s).  I have reviewed the patient's chart and labs.  Questions were answered to the patient's satisfaction.     Lupita CHARLENA Commander, MD, NOLIA

## 2024-08-31 NOTE — Op Note (Signed)
 [facility] Patient Name: Jenny Hicks Procedure Date: 08/31/2024 11:12 AM MRN: [id number] Endoscopist: Lupita FORBES Commander , MD, [id number] Age: 73 Referring MD:  Date of Birth: 04/25/1951 Gender: Female Account #: [id number] Procedure:                Upper GI endoscopy Indications:              Dysphagia, Suspected esophageal reflux, weight                            loss, bloating Medicines:                Monitored Anesthesia Care Procedure:                Pre-Anesthesia Assessment:                           - Prior to the procedure, a History and Physical                            was performed, and patient medications and                            allergies were reviewed. The patient's tolerance of                            previous anesthesia was also reviewed. The risks                            and benefits of the procedure and the sedation                            options and risks were discussed with the patient.                            All questions were answered, and informed consent                            was obtained. Prior Anticoagulants: The patient has                            taken no anticoagulant or antiplatelet agents. ASA                            Grade Assessment: II - A patient with mild systemic                            disease. After reviewing the risks and benefits,                            the patient was deemed in satisfactory condition to                            undergo the procedure.  After obtaining informed consent, the endoscope was                            passed under direct vision. Throughout the                            procedure, the patient's blood pressure, pulse, and                            oxygen saturations were monitored continuously. The                            GIF [id number] #[id number] was introduced through the                            mouth, and advanced to the second part of  duodenum.                            The upper GI endoscopy was accomplished without                            difficulty. The patient tolerated the procedure                            well. Scope In: Scope Out: Findings:                 No endoscopic abnormality was evident in the                            esophagus to explain the patient's complaint of                            dysphagia. The scope was withdrawn. Dilation was                            performed with a Maloney dilator with mild                            resistance at 54 Fr. The dilation site was examined                            following endoscope reinsertion and showed no                            change. Estimated blood loss: none.                           Patchy mild mucosal changes characterized by                            congestion and erythema were found in the gastric                            antrum. Biopsies  were taken with a cold forceps for                            histology. Verification of patient identification                            for the specimen was done. Estimated blood loss was                            minimal.                           Multiple small sessile polyps were found in the                            gastric fundus and in the gastric body. Biopsies                            were taken with a cold forceps for histology.                            Verification of patient identification for the                            specimen was done. Estimated blood loss was minimal.                           The exam was otherwise without abnormality.                           The cardia and gastric fundus were normal on                            retroflexion.                           The gastroesophageal flap valve was visualized                            endoscopically and classified as Hill Grade II                            (fold present, opens with  respiration). Complications:            No immediate complications. Estimated Blood Loss:     Estimated blood loss was minimal. Impression:               - No endoscopic esophageal abnormality to explain                            patient's dysphagia. Dilated.                           - Congested and erythematous mucosa in the antrum.  Biopsied.                           - Multiple gastric polyps. Biopsied.                           - The examination was otherwise normal.                           - Gastroesophageal flap valve classified as Hill                            Grade II (fold present, opens with respiration). Recommendation:           - Patient has a contact number available for                            emergencies. The signs and symptoms of potential                            delayed complications were discussed with the                            patient. Return to normal activities tomorrow.                            Written discharge instructions were provided to the                            patient.                           - Clear liquids x 1 hour then soft foods rest of                            day. Start prior diet tomorrow.                           - Continue present medications.                           - Await pathology results. Will communicate with                            primary GI MD, Dr. Shila Lupita FORBES Avram, MD 08/31/2024 11:46:49 AM This report has been signed electronically.

## 2024-08-31 NOTE — Progress Notes (Signed)
 To PACU via stretcher, sedated, good respiratory effort, VSS.

## 2024-08-31 NOTE — Patient Instructions (Addendum)
 Stomach lining looked inflamed.  I took biopsies of that area.  There were also multiple stomach polyps that look like innocent polyps called fundic gland polyps which we see commonly.  We typically do not need any follow-up.  The esophagus looked normal.  I did dilated to see if that helps you swallow better.  I had mentioned that I thought Namenda  might be linked to swallowing difficulty but I double checked that and that is not the case.  Once I see pathology results I will let you know what they mean in any different treatment and I will communicate with Dr. Nandigam also.  I appreciate the opportunity to care for you. Jenny CHARLENA Commander, MD, FACG YOU HAD AN ENDOSCOPIC PROCEDURE TODAY AT THE [facility]:   Refer to the procedure report that was given to you for any specific questions about what was found during the examination.  If the procedure report does not answer your questions, please call your gastroenterologist to clarify.  If you requested that your care partner not be given the details of your procedure findings, then the procedure report has been included in a sealed envelope for you to review at your convenience later.  YOU SHOULD EXPECT: Some feelings of bloating in the abdomen. Passage of more gas than usual.  Walking can help get rid of the air that was put into your GI tract during the procedure and reduce the bloating. If you had a lower endoscopy (such as a colonoscopy or flexible sigmoidoscopy) you may notice spotting of blood in your stool or on the toilet paper. If you underwent a bowel prep for your procedure, you may not have a normal bowel movement for a few days.  Please Note:  You might notice some irritation and congestion in your nose or some drainage.  This is from the oxygen used during your procedure.  There is no need for concern and it should clear up in a day or so.  SYMPTOMS TO REPORT IMMEDIATELY:  Following upper endoscopy (EGD)  Vomiting of blood  or coffee ground material  New chest pain or pain under the shoulder blades  Painful or persistently difficult swallowing  New shortness of breath  Fever of 100F or higher  Black, tarry-looking stools  For urgent or emergent issues, a gastroenterologist can be reached at any hour by calling [phone]. Do not use MyChart messaging for urgent concerns.    DIET:  We do recommend a small meal at first, but then you may proceed to your regular diet.  Drink plenty of fluids but you should avoid alcoholic beverages for 24 hours.  ACTIVITY:  You should plan to take it easy for the rest of today and you should NOT DRIVE or use heavy machinery until tomorrow (because of the sedation medicines used during the test).    FOLLOW UP: Our staff will call the number listed on your records the next business day following your procedure.  We will call around 7:15- 8:00 am to check on you and address any questions or concerns that you may have regarding the information given to you following your procedure. If we do not reach you, we will leave a message.     If any biopsies were taken you will be contacted by phone or by letter within the next 1-3 weeks.  Please call us  at [phone] if you have not heard about the biopsies in 3 weeks.    SIGNATURES/CONFIDENTIALITY: You and/or your care partner  have signed paperwork which will be entered into your electronic medical record.  These signatures attest to the fact that that the information above on your After Visit Summary has been reviewed and is understood.  Full responsibility of the confidentiality of this discharge information lies with you and/or your care-partner.

## 2024-08-31 NOTE — Progress Notes (Signed)
 Pt's states no medical or surgical changes since previsit or office visit.

## 2024-08-31 NOTE — Progress Notes (Signed)
 Called to room to assist during endoscopic procedure.  Patient ID and intended procedure confirmed with present staff. Received instructions for my participation in the procedure from the performing physician.

## 2024-09-03 ENCOUNTER — Telehealth: Payer: Self-pay

## 2024-09-03 NOTE — Telephone Encounter (Signed)
  Follow up Call-     08/31/2024   10:30 AM 04/23/2024    9:07 AM  Call back number  Post procedure Call Back phone  # [phone]  Permission to leave phone message Yes Yes     Patient questions:  Do you have a fever, pain , or abdominal swelling? No. Pain Score  0 *  Have you tolerated food without any problems? Yes.    Have you been able to return to your normal activities? Yes.    Do you have any questions about your discharge instructions: Diet   No. Medications  No. Follow up visit  No.  Do you have questions or concerns about your Care? No.  Actions: * If pain score is 4 or above: No action needed, pain <4.

## 2024-09-05 LAB — SURGICAL PATHOLOGY

## 2024-09-06 ENCOUNTER — Telehealth: Payer: Self-pay | Admitting: Gastroenterology

## 2024-09-06 NOTE — Telephone Encounter (Signed)
 The pt has been advised that the results have not been reviewed as of this afternoon. We will contact her as soon as able

## 2024-09-06 NOTE — Telephone Encounter (Signed)
 Patient requesting f/u call in regards to results. Please advise.

## 2024-09-07 ENCOUNTER — Ambulatory Visit: Payer: Self-pay | Admitting: Internal Medicine

## 2024-10-03 ENCOUNTER — Ambulatory Visit: Admitting: *Deleted

## 2024-10-03 VITALS — Ht 64.0 in | Wt 150.0 lb

## 2024-10-03 DIAGNOSIS — Z0000 Encounter for general adult medical examination without abnormal findings: Secondary | ICD-10-CM

## 2024-10-03 NOTE — Patient Instructions (Signed)
 Jenny Hicks,  Thank you for taking the time for your Medicare Wellness Visit. I appreciate your continued commitment to your health goals. Please review the care plan we discussed, and feel free to reach out if I can assist you further.  Please note that Annual Wellness Visits do not include a physical exam. Some assessments may be limited, especially if the visit was conducted virtually. If needed, we may recommend an in-person follow-up with your provider.  Ongoing Care Seeing your primary care provider every 3 to 6 months helps us  monitor your health and provide consistent, personalized care.   Referrals If a referral was made during today's visit and you haven't received any updates within two weeks, please contact the referred provider directly to check on the status.  Recommended Screenings:  Health Maintenance  Topic Date Due   Flu Shot  02/05/2025*   Osteoporosis screening with Bone Density Scan  01/30/2025   Breast Cancer Screening  01/31/2025   Medicare Annual Wellness Visit  10/03/2025   DTaP/Tdap/Td vaccine (4 - Td or Tdap) 12/16/2027   Colon Cancer Screening  04/23/2029   Pneumococcal Vaccine for age over 11  Completed   Hepatitis C Screening  Completed   Zoster (Shingles) Vaccine  Completed   Meningitis B Vaccine  Aged Out   COVID-19 Vaccine  Discontinued  *Topic was postponed. The date shown is not the original due date.       10/03/2024    3:07 PM  Advanced Directives  Does Patient Have a Medical Advance Directive? No  Would patient like information on creating a medical advance directive? No - Patient declined    Vision: Annual vision screenings are recommended for early detection of glaucoma, cataracts, and diabetic retinopathy. These exams can also reveal signs of chronic conditions such as diabetes and high blood pressure.  Dental: Annual dental screenings help detect early signs of oral cancer, gum disease, and other conditions linked to overall health,  including heart disease and diabetes.  Please see the attached documents for additional preventive care recommendations.    Jenny Hicks , Thank you for taking time to come for your Medicare Wellness Visit. I appreciate your ongoing commitment to your health goals. Please review the following plan we discussed and let me know if I can assist you in the future.   Screening recommendations/referrals: Colonoscopy:  Mammogram:  Bone Density:  Recommended yearly ophthalmology/optometry visit for glaucoma screening and checkup Recommended yearly dental visit for hygiene and checkup  Vaccinations: Influenza vaccine:  Pneumococcal vaccine:  Tdap vaccine:  Shingles vaccine:       Preventive Care 65 Years and Older, Female Preventive care refers to lifestyle choices and visits with your health care provider that can promote health and wellness. What does preventive care include? A yearly physical exam. This is also called an annual well check. Dental exams once or twice a year. Routine eye exams. Ask your health care provider how often you should have your eyes checked. Personal lifestyle choices, including: Daily care of your teeth and gums. Regular physical activity. Eating a healthy diet. Avoiding tobacco and drug use. Limiting alcohol use. Practicing safe sex. Taking low-dose aspirin every day. Taking vitamin and mineral supplements as recommended by your health care provider. What happens during an annual well check? The services and screenings done by your health care provider during your annual well check will depend on your age, overall health, lifestyle risk factors, and family history of disease. Counseling  Your health care provider  may ask you questions about your: Alcohol use. Tobacco use. Drug use. Emotional well-being. Home and relationship well-being. Sexual activity. Eating habits. History of falls. Memory and ability to understand (cognition). Work and work  astronomer. Reproductive health. Screening  You may have the following tests or measurements: Height, weight, and BMI. Blood pressure. Lipid and cholesterol levels. These may be checked every 5 years, or more frequently if you are over 50 years old. Skin check. Lung cancer screening. You may have this screening every year starting at age 68 if you have a 30-pack-year history of smoking and currently smoke or have quit within the past 15 years. Fecal occult blood test (FOBT) of the stool. You may have this test every year starting at age 78. Flexible sigmoidoscopy or colonoscopy. You may have a sigmoidoscopy every 5 years or a colonoscopy every 10 years starting at age 67. Hepatitis C blood test. Hepatitis B blood test. Sexually transmitted disease (STD) testing. Diabetes screening. This is done by checking your blood sugar (glucose) after you have not eaten for a while (fasting). You may have this done every 1-3 years. Bone density scan. This is done to screen for osteoporosis. You may have this done starting at age 58. Mammogram. This may be done every 1-2 years. Talk to your health care provider about how often you should have regular mammograms. Talk with your health care provider about your test results, treatment options, and if necessary, the need for more tests. Vaccines  Your health care provider may recommend certain vaccines, such as: Influenza vaccine. This is recommended every year. Tetanus, diphtheria, and acellular pertussis (Tdap, Td) vaccine. You may need a Td booster every 10 years. Zoster vaccine. You may need this after age 4. Pneumococcal 13-valent conjugate (PCV13) vaccine. One dose is recommended after age 74. Pneumococcal polysaccharide (PPSV23) vaccine. One dose is recommended after age 78. Talk to your health care provider about which screenings and vaccines you need and how often you need them. This information is not intended to replace advice given to you by  your health care provider. Make sure you discuss any questions you have with your health care provider. Document Released: 11/21/2015 Document Revised: 07/14/2016 Document Reviewed: 08/26/2015 Elsevier Interactive Patient Education  2017 [employer].  Fall Prevention in the Home Falls can cause injuries. They can happen to people of all ages. There are many things you can do to make your home safe and to help prevent falls. What can I do on the outside of my home? Regularly fix the edges of walkways and driveways and fix any cracks. Remove anything that might make you trip as you walk through a door, such as a raised step or threshold. Trim any bushes or trees on the path to your home. Use bright outdoor lighting. Clear any walking paths of anything that might make someone trip, such as rocks or tools. Regularly check to see if handrails are loose or broken. Make sure that both sides of any steps have handrails. Any raised decks and porches should have guardrails on the edges. Have any leaves, snow, or ice cleared regularly. Use sand or salt on walking paths during winter. Clean up any spills in your garage right away. This includes oil or grease spills. What can I do in the bathroom? Use night lights. Install grab bars by the toilet and in the tub and shower. Do not use towel bars as grab bars. Use non-skid mats or decals in the tub or shower. If you  need to sit down in the shower, use a plastic, non-slip stool. Keep the floor dry. Clean up any water that spills on the floor as soon as it happens. Remove soap buildup in the tub or shower regularly. Attach bath mats securely with double-sided non-slip rug tape. Do not have throw rugs and other things on the floor that can make you trip. What can I do in the bedroom? Use night lights. Make sure that you have a light by your bed that is easy to reach. Do not use any sheets or blankets that are too big for your bed. They should not hang  down onto the floor. Have a firm chair that has side arms. You can use this for support while you get dressed. Do not have throw rugs and other things on the floor that can make you trip. What can I do in the kitchen? Clean up any spills right away. Avoid walking on wet floors. Keep items that you use a lot in easy-to-reach places. If you need to reach something above you, use a strong step stool that has a grab bar. Keep electrical cords out of the way. Do not use floor polish or wax that makes floors slippery. If you must use wax, use non-skid floor wax. Do not have throw rugs and other things on the floor that can make you trip. What can I do with my stairs? Do not leave any items on the stairs. Make sure that there are handrails on both sides of the stairs and use them. Fix handrails that are broken or loose. Make sure that handrails are as long as the stairways. Check any carpeting to make sure that it is firmly attached to the stairs. Fix any carpet that is loose or worn. Avoid having throw rugs at the top or bottom of the stairs. If you do have throw rugs, attach them to the floor with carpet tape. Make sure that you have a light switch at the top of the stairs and the bottom of the stairs. If you do not have them, ask someone to add them for you. What else can I do to help prevent falls? Wear shoes that: Do not have high heels. Have rubber bottoms. Are comfortable and fit you well. Are closed at the toe. Do not wear sandals. If you use a stepladder: Make sure that it is fully opened. Do not climb a closed stepladder. Make sure that both sides of the stepladder are locked into place. Ask someone to hold it for you, if possible. Clearly mark and make sure that you can see: Any grab bars or handrails. First and last steps. Where the edge of each step is. Use tools that help you move around (mobility aids) if they are needed. These  include: Canes. Walkers. Scooters. Crutches. Turn on the lights when you go into a dark area. Replace any light bulbs as soon as they burn out. Set up your furniture so you have a clear path. Avoid moving your furniture around. If any of your floors are uneven, fix them. If there are any pets around you, be aware of where they are. Review your medicines with your doctor. Some medicines can make you feel dizzy. This can increase your chance of falling. Ask your doctor what other things that you can do to help prevent falls. This information is not intended to replace advice given to you by your health care provider. Make sure you discuss any questions you have with your  health care provider. Document Released: 08/21/2009 Document Revised: 04/01/2016 Document Reviewed: 11/29/2014 Elsevier Interactive Patient Education  2017 [employer].

## 2024-10-03 NOTE — Progress Notes (Signed)
 Chief Complaint  Patient presents with   Medicare Wellness     Subjective:   Jenny Hicks is a 73 y.o. female who presents for a Medicare Annual Wellness Visit.  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 03/2013 Echo: EF 55-60%, mild MVP, mild to mod MR.   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  Recommended continued compression for symptomatic improvement.  She can continue Advil as needed for pain.  Recommend no lifting more than 5 pounds for another 3 weeks and then gradual increase to tolerance.  She can move the wrist and the elbow to tolerance   Scoliosis    SVT (supraventricular tachycardia)    a. 03/2013 occurred during ex MV-->resolved with adenosine and carotid massage.   Past Surgical History:  Procedure Laterality Date   bladder prolapse  2022   COLONOSCOPY  01/12/2011   DIAGNOSTIC MAMMOGRAM  08/06/2014   digital   TRANSTHORACIC ECHOCARDIOGRAM  03/2013   EF 55-60%, mild conc hypertrophy; mild MVP, mild-mod MR   UMBILICAL HERNIA REPAIR  03/02/2021   with colprpex   VAGINAL HYSTERECTOMY  1989   partial; pt has both ovaries   Family History  Problem Relation Age of Onset   Heart attack Mother    Hypertension Mother    Stroke Mother    Breast cancer Mother     Osteoporosis Mother        cervical/femur fracture   Kidney disease Mother    Alzheimer's disease Mother    Atrial fibrillation Mother    Myasthenia gravis Father    Prostate cancer Father    Prostate cancer Brother    Early death Maternal Grandmother        died after 11/07/2024 baby   Heart disease Maternal Grandfather    Congestive Heart Failure Maternal Grandfather    Ovarian cancer Paternal Grandmother    Other Daughter        mitral valve damaged had to be repaired, cause unknown   Arthritis Maternal Aunt    Arthritis Maternal Uncle    Prostate cancer Maternal Uncle    Breast cancer Cousin    Colon cancer Neg Hx    Esophageal cancer Neg Hx    Rectal cancer Neg Hx    Colon polyps Neg Hx    Stomach cancer Neg Hx    Social History   Occupational History   Occupation: retired [employer]  Tobacco Use   Smoking status: Never   Smokeless tobacco: Never  Vaping Use   Vaping status: Never Used  Substance and Sexual Activity   Alcohol use: No   Drug use: No   Sexual activity: Yes    Partners: Male    Birth control/protection: Surgical  Comment: married   Tobacco Counseling Counseling given: Not Answered  SDOH Screenings   Food Insecurity: No Food Insecurity (10/03/2024)  Housing: Unknown (10/03/2024)  Transportation Needs: No Transportation Needs (10/03/2024)  Utilities: Not At Risk (10/03/2024)  Alcohol Screen: Low Risk  (04/20/2024)  Depression (PHQ2-9): Low Risk  (10/03/2024)  Financial Resource Strain: Low Risk  (04/20/2024)  Physical Activity: Sufficiently Active (10/03/2024)  Social Connections: Moderately Integrated (10/03/2024)  Stress: No Stress Concern Present (10/03/2024)  Tobacco Use: Low Risk  (10/03/2024)  Health Literacy: Adequate Health Literacy (10/03/2024)   See flowsheets for full screening details  Depression Screen PHQ 2 & 9 Depression Scale- Over the past 2 weeks, how often have you been bothered by any of the following problems? Little interest or  pleasure in doing things: 2 Feeling down, depressed, or hopeless (PHQ Adolescent also includes...irritable): 0 PHQ-2 Total Score: 2 Trouble falling or staying asleep, or sleeping too much: 0 Feeling tired or having little energy: 0 Poor appetite or overeating (PHQ Adolescent also includes...weight loss): 0 Feeling bad about yourself - or that you are a failure or have let yourself or your family down: 0 Trouble concentrating on things, such as reading the newspaper or watching television (PHQ Adolescent also includes...like school work): 0 Moving or speaking so slowly that other people could have noticed. Or the opposite - being so fidgety or restless that you have been moving around a lot more than usual: 0 Thoughts that you would be better off dead, or of hurting yourself in some way: 0 PHQ-9 Total Score: 2 If you checked off any problems, how difficult have these problems made it for you to do your work, take care of things at home, or get along with other people?: Not difficult at all     Goals Addressed             This Visit's Progress    Patient Stated   On track    Stay active      Patient Stated       Stay healthy       Visit info / Clinical Intake: Medicare Wellness Visit Type:: Subsequent Annual Wellness Visit Persons participating in visit:: patient Medicare Wellness Visit Mode:: Telephone If telephone:: video declined Because this visit was a virtual/telehealth visit:: unable to obtan vitals due to lack of equipment If Telephone or Video please confirm:: I connected with the patient using audio enabled telemedicine application and verified that I am speaking with the correct person using two identifiers; I discussed the limitations of evaluation and management by telemedicine; The patient expressed understanding and agreed to proceed Patient Location:: home Provider Location:: home Information given by:: patient Interpreter Needed?: No Pre-visit prep was  completed: no AWV questionnaire completed by patient prior to visit?: no Living arrangements:: lives with spouse/significant other Patient's Overall Health Status Rating: good Typical amount of pain: some Does pain affect daily life?: no Are you currently prescribed opioids?: no  Dietary Habits and Nutritional Risks How many meals a day?: 3 Eats fruit and vegetables daily?: yes Most meals are obtained by: preparing own meals In the last 2 weeks, have you had any of the following?: none Diabetic:: no  Functional Status Activities of Daily Living (to include ambulation/medication): Independent Ambulation: Independent Medication Administration: Independent Home Management: Independent Manage your own finances?: yes Primary transportation is: driving Concerns about vision?: no *vision screening is required for WTM* Concerns about hearing?: no  Fall Screening Falls in the past year?: 0 Number of  falls in past year: 0 Was there an injury with Fall?: 0 Fall Risk Category Calculator: 0 Patient Fall Risk Level: Low Fall Risk  Fall Risk Patient at Risk for Falls Due to: No Fall Risks Fall risk Follow up: Falls evaluation completed; Education provided; Falls prevention discussed  Home and Transportation Safety: All rugs have non-skid backing?: yes All stairs or steps have railings?: N/A, no stairs Grab bars in the bathtub or shower?: yes Have non-skid surface in bathtub or shower?: yes Good home lighting?: yes Regular seat belt use?: yes Hospital stays in the last year:: no  Cognitive Assessment Difficulty concentrating, remembering, or making decisions? : no Will 6CIT or Mini Cog be Completed: yes What year is it?: 0 points What month is it?: 0 points Give patient an address phrase to remember (5 components): Its very sunny outside today in November About what time is it?: 0 points Count backwards from 20 to 1: 0 points Say the months of the year in reverse: 0  points Repeat the address phrase from earlier: 0 points 6 CIT Score: 0 points  Advance Directives (For Healthcare) Does Patient Have a Medical Advance Directive?: No Does patient want to make changes to medical advance directive?: No - Patient declined Type of Advance Directive: Healthcare Power of [employer] of Healthcare Power of Attorney in Chart?: No - copy requested Would patient like information on creating a medical advance directive?: No - Patient declined  Reviewed/Updated  Reviewed/Updated: Surgical History; Family History; Reviewed All (Medical, Surgical, Family, Medications, Allergies, Care Teams, Patient Goals); Medications; Allergies; Care Teams; Patient Goals; Medical History        Objective:    Today's Vitals   10/03/24 1505  Weight: 150 lb (68 kg)  Height: 5' 4 (1.626 m)   Body mass index is 25.75 kg/m.  Current Medications (verified) Outpatient Encounter Medications as of 10/03/2024  Medication Sig   Calcium Carb-Cholecalciferol (CALCIUM 600 + D PO) Take 600 mg by mouth daily. Vit D 800 units   Cobalamin Combinations (B-12) 1000-400 MCG SUBL Place under the tongue once. Takes dropper once a day   esomeprazole  (NEXIUM ) 20 MG capsule Take 1 capsule (20 mg total) by mouth daily.   memantine  (NAMENDA ) 5 MG tablet Take 1 tablet (5 mg total) by mouth at bedtime.   metoprolol  succinate (TOPROL -XL) 25 MG 24 hr tablet Take 1 tablet by mouth once daily   Multiple Vitamins-Minerals (CENTRUM MULTIGUMMIES PO) Take by mouth.   Vitamin D , Cholecalciferol, 25 MCG (1000 UT) TABS Take 2,000 Units by mouth. 2,000 units 3x a week   No facility-administered encounter medications on file as of 10/03/2024.   Hearing/Vision screen Hearing Screening - Comments:: No trouble hearing Vision Screening - Comments:: [place]  Up to date Immunizations and Health Maintenance Health Maintenance  Topic Date Due   Influenza Vaccine  02/05/2025 (Originally 06/08/2024)   Bone Density Scan   01/30/2025   Mammogram  01/31/2025   Medicare Annual Wellness (AWV)  10/03/2025   DTaP/Tdap/Td (4 - Td or Tdap) 12/16/2027   Colonoscopy  04/23/2029   Pneumococcal Vaccine: 50+ Years  Completed   Hepatitis C Screening  Completed   Zoster Vaccines- Shingrix  Completed   Meningococcal B Vaccine  Aged Out   COVID-19 Vaccine  Discontinued        Assessment/Plan:  This is a routine wellness examination for Kalyani.  Patient Care Team: Catherine Charlies LABOR, DO as PCP - General (Family Medicine) Mona Vinie BROCKS, MD as Consulting Physician (  Cardiology) Rox Charleston, MD as Consulting Physician (Obstetrics and Gynecology) Harden Jerona GAILS, MD as Consulting Physician (Orthopedic Surgery) Alvia Dorothyann LABOR, MD as Referring Physician (Gynecology) Nandigam, Kavitha V, MD as Consulting Physician (Gastroenterology)  I have personally reviewed and noted the following in the patient's chart:   Medical and social history Use of alcohol, tobacco or illicit drugs  Current medications and supplements including opioid prescriptions. Functional ability and status Nutritional status Physical activity Advanced directives List of other physicians Hospitalizations, surgeries, and ER visits in previous 12 months Vitals Screenings to include cognitive, depression, and falls Referrals and appointments  No orders of the defined types were placed in this encounter.  In addition, I have reviewed and discussed with patient certain preventive protocols, quality metrics, and best practice recommendations. A written personalized care plan for preventive services as well as general preventive health recommendations were provided to patient.   Mliss Graff, LPN   [id number]   Return in 1 year (on 10/03/2025).  After Visit Summary: (MyChart) Due to this being a telephonic visit, the after visit summary with patients personalized plan was offered to patient via MyChart   Nurse Notes:

## 2024-10-11 ENCOUNTER — Institutional Professional Consult (permissible substitution): Payer: PPO | Admitting: Psychology

## 2024-10-11 ENCOUNTER — Ambulatory Visit: Payer: Self-pay

## 2024-10-16 ENCOUNTER — Encounter: Payer: Self-pay | Admitting: Cardiovascular Disease

## 2024-10-16 ENCOUNTER — Ambulatory Visit: Attending: Cardiovascular Disease | Admitting: Cardiovascular Disease

## 2024-10-16 VITALS — BP 124/88 | HR 82 | Ht 64.0 in | Wt 154.1 lb

## 2024-10-16 DIAGNOSIS — R072 Precordial pain: Secondary | ICD-10-CM

## 2024-10-16 DIAGNOSIS — I471 Supraventricular tachycardia, unspecified: Secondary | ICD-10-CM | POA: Diagnosis not present

## 2024-10-16 DIAGNOSIS — I872 Venous insufficiency (chronic) (peripheral): Secondary | ICD-10-CM | POA: Diagnosis not present

## 2024-10-16 MED ORDER — METOPROLOL TARTRATE 25 MG PO TABS: 25.0000 mg | ORAL_TABLET | Freq: Every day | ORAL | 3 refills | Status: AC | PRN

## 2024-10-16 NOTE — Progress Notes (Signed)
 Cardiology Office Note   Date:  10/16/2024  ID:  MALU Hicks, DOB 08/24/1951, MRN [id number] PCP: Catherine Charlies LABOR, DO  [facility] Providers Cardiologist:  None     History of Present Illness Jenny Hicks is a 73 y.o. female with a history of SVT who is being seen today for the evaluation of chest discomfort at the request of Kuneff, Renee A, DO.   She experiences chest pain primarily after eating or when lying down, located on the left side and associated with reflux symptoms. The pain is short-lived and sometimes relieved by swallowing water or taking an antacid. Recently, she has been experiencing worsening chest pain and is concerned about having issues during her upcoming trip to [place].  She has a history of supraventricular tachycardia (SVT) with episodes where her heart rate could not be controlled without medication, specifically adenosine. She is currently on metoprolol , taking half of the smallest available dose. A recent episode of chest discomfort occurred during a fast walk with her daughters, where her heart rate increased to 120 beats per minute and took about 30 minutes to settle down.  Her past cardiac workup includes an echocardiogram in September 2020 showing normal left ventricular size with concentric left ventricular hypertrophy, ejection fraction greater than 65%, normal diastolic function, mild mitral regurgitation, and mild tricuspid regurgitation. An arrhythmia monitor in 2020 showed rare premature atrial contractions, rare premature ventricular contractions, and thousands of brief episodes of non-sustained ectopic atrial tachycardia, with a few sustained episodes lasting up to two minutes and fourteen seconds. There was no atrial fibrillation noted.  She has undergone multiple diagnostic studies, including a normal nuclear stress test in 2017, duplex venous ultrasounds in 2024 showing no evidence of deep vein thrombosis, and an MR angiogram of the neck  and head in 2024 showing widely patent carotid arteries with no evidence of stenosis. An MR of the brain performed for a suspected transient ischemic attack showed no evidence of stroke.  She reports a family history of heart disease, with her mother having atrial fibrillation and a heart attack, her brother having congestive heart failure, and her grandfather dying of congestive heart failure. Her brother's heart issues began in his late sixties.  She experiences swelling in her legs, which she attributes to varicose veins, and uses compression socks when traveling. She also has a history of plantar fasciitis and numbness in her toes. She maintains an active lifestyle, walking regularly with her husband, and spends time in [place].  Her most recent lipid profile from 02/01/2024 shows total cholesterol 190, HDL 60, LDL 111, triglycerides 98.  She has normal renal function (creatinine 0.75) and she does not have diabetes mellitus (A1c 5.5% this year)  Studies Reviewed EKG Interpretation Date/Time:  Tuesday October 16 2024 08:46:45 EST Ventricular Rate:  82 PR Interval:  170 QRS Duration:  76 QT Interval:  378 QTC Calculation: 441 R Axis:   -25  Text Interpretation: Sinus rhythm with marked sinus arrhythmia When compared with ECG of 12-Jul-2008 09:56, Premature ventricular complexes are no longer Present Inverted T waves have replaced nonspecific T wave abnormality in Inferior leads Confirmed by Latoya Maulding [phone]) on 10/16/2024 9:01:05 AMTransthoracic echocardiogram 08/08/2019 Arrhythmia monitor 07/18/2019 SPECT Myoview  09/22/2016 Lower extremity duplex ultrasound venous studies from March and May 2024 MRI/MRA of the head and neck 10/24/2023   Risk Assessment/Calculations           Physical Exam VS:  BP 124/88   Pulse 82  Ht 5' 4 (1.626 m)   Wt 154 lb 1.6 oz (69.9 kg)   SpO2 96%   BMI 26.45 kg/m        Wt Readings from Last 3 Encounters:  10/16/24 154 lb 1.6 oz  (69.9 kg)  10/03/24 150 lb (68 kg)  08/31/24 150 lb (68 kg)    GEN: Well nourished, well developed in no acute distress NECK: No JVD; No carotid bruits CARDIAC: RRR, no murmurs, rubs, gallops RESPIRATORY:  Clear to auscultation without rales, wheezing or rhonchi  ABDOMEN: Soft, non-tender, non-distended EXTREMITIES: 1+ nonpitting edema of the ankles, moderate bilateral varicose veins; No deformity   ASSESSMENT AND PLAN Chest pain:  intermittent chest pain, likely related to acid reflux, but coronary artery disease needs to be ruled out. Family history of heart disease, but no early onset. Previous tests show no evidence of coronary sufficiency by nuclear perfusion testing.  CT coronary angiography planned to assess coronary arteries and plaque burden. Discussed the use of nitroglycerin and beta-blockers to get good quality angiographic images. Paroxysmal supraventricular tachycardia: Previous monitor showed episodes of SVT with self-terminating spells lasting up to 2 minutes and 14 seconds. Managed with metoprolol . Discussed vagal maneuvers and potential use of adenosine if needed for breakthrough event. Consideration of self-monitoring devices for rhythm assessment. Discussed the use of Kardia device for EKG monitoring and its benefits over smart watches.   Continue metoprolol  for SVT management. Peripheral venous insufficiency:  Presence of varicose veins with associated swelling. Previous ultrasounds show no DVT. Symptoms managed with compression stockings and leg elevation.  Advised wearing compression stockings during travel, such as her planned flight to [place] next month.  Recommended leg elevation to reduce swelling    Dispo: Coronary CT angiography.  Consider purchasing a personal ECG monitoring device such as a smart watch or Kardia device.  Keep legs elevated and wear compression stockings during prolonged travel or standing to reduce lower extremity swelling.  Signed, Jerel Balding, MD

## 2024-10-16 NOTE — Patient Instructions (Addendum)
 Medication Instructions:  You can take Metoprolol  Tartrate 25 mg up to 4 times a day as needed for fast heart rate *If you need a refill on your cardiac medications before your next appointment, please call your pharmacy*  Lab Work: None ordered If you have labs (blood work) drawn today and your tests are completely normal, you will receive your results only by: MyChart Message (if you have MyChart) OR A paper copy in the mail If you have any lab test that is abnormal or we need to change your treatment, we will call you to review the results.  Testing/Procedures:   Your cardiac CT will be scheduled at:   [facility] [address]  [place], [place] [address]  If scheduled at the [facility] at [address], please enter the parking lot using the [address] entrance and use the FREE valet service at the patient drop-off area. Enter the building and check-in with registration on the main floor.  Please follow these instructions carefully (unless otherwise directed):  An IV will be required for this test and Nitroglycerin will be given.    On the Night Before the Test: Be sure to Drink plenty of water. Do not consume any caffeinated/decaffeinated beverages or chocolate 12 hours prior to your test. Do not take any antihistamines 12 hours prior to your test.  On the Day of the Test: Drink plenty of water until 1 hour prior to the test. Do not eat any food 1 hour prior to test. You may take your regular medications prior to the test.  Take metoprolol  (Lopressor ) 75 mg two hours prior to test. If you take Furosemide /Hydrochlorothiazide/Spironolactone/Chlorthalidone, please HOLD on the morning of the test. Patients who wear a continuous glucose monitor MUST remove the device prior to scanning. FEMALES- please wear underwire-free bra if available, avoid dresses & tight clothing       After the Test: Drink plenty of water. After  receiving IV contrast, you may experience a mild flushed feeling. This is normal. On occasion, you may experience a mild rash up to 24 hours after the test. This is not dangerous. If this occurs, you can take Benadryl 25 mg, Zyrtec, Claritin, or Allegra and increase your fluid intake. (Patients taking Tikosyn should avoid Benadryl, and may take Zyrtec, Claritin, or Allegra) If you experience trouble breathing, this can be serious. If it is severe call 911 IMMEDIATELY. If it is mild, please call our office.  We will call to schedule your test 2-4 weeks out understanding that some insurance companies will need an authorization prior to the service being performed.   For more information and frequently asked questions, please visit our website : [email or web address]  For non-scheduling related questions, please contact the cardiac imaging nurse navigator should you have any questions/concerns: Cardiac Imaging Nurse Navigators Direct Office Dial: [phone]   For scheduling needs, including cancellations and rescheduling, please call Brittany, [phone].   Follow-Up: At [facility], you and your health needs are our priority.  As part of our continuing mission to provide you with exceptional heart care, our providers are all part of one team.  This team includes your primary Cardiologist (physician) and Advanced Practice Providers or APPs (Physician Assistants and Nurse Practitioners) who all work together to provide you with the care you need, when you need it.  Your next appointment:    Follow up as needed  Provider:   Dr [name]  We recommend signing up for  the patient portal called MyChart.  Sign up information is provided on this After Visit Summary.  MyChart is used to connect with patients for Virtual Visits (Telemedicine).  Patients are able to view lab/test results, encounter notes, upcoming appointments, etc.  Non-urgent messages can be sent to your  provider as well.   To learn more about what you can do with MyChart, go to [email or web address].

## 2024-10-18 ENCOUNTER — Ambulatory Visit: Admitting: Gastroenterology

## 2024-10-18 ENCOUNTER — Encounter: Payer: Self-pay | Admitting: Gastroenterology

## 2024-10-18 VITALS — BP 122/80 | HR 65 | Ht 64.0 in | Wt 152.2 lb

## 2024-10-18 DIAGNOSIS — K2100 Gastro-esophageal reflux disease with esophagitis, without bleeding: Secondary | ICD-10-CM

## 2024-10-18 DIAGNOSIS — K5904 Chronic idiopathic constipation: Secondary | ICD-10-CM

## 2024-10-18 DIAGNOSIS — K581 Irritable bowel syndrome with constipation: Secondary | ICD-10-CM

## 2024-10-18 MED ORDER — DOCUSATE SODIUM 100 MG PO CAPS: 100.0000 mg | ORAL_CAPSULE | Freq: Two times a day (BID) | ORAL | 5 refills | Status: AC | PRN

## 2024-10-18 MED ORDER — FAMOTIDINE 20 MG PO TABS: 20.0000 mg | ORAL_TABLET | Freq: Every day | ORAL | 11 refills | Status: AC

## 2024-10-18 MED ORDER — ESOMEPRAZOLE MAGNESIUM 20 MG PO CPDR: 20.0000 mg | DELAYED_RELEASE_CAPSULE | Freq: Every day | ORAL | 11 refills | Status: AC

## 2024-10-18 NOTE — Progress Notes (Unsigned)
 Jenny Hicks    [id number]    December 26, 1950  Primary Care Physician:Kuneff, Charlies LABOR, DO  Referring Physician: Catherine Charlies LABOR, DO [address] [place],  [place] [address]   Chief complaint:  dysphagia, GERD  Discussed the use of AI scribe software for clinical note transcription with the patient, who gave verbal consent to proceed.  History of Present Illness Jenny Hicks is a 73 year old female with gastroesophageal reflux disease and supraventricular tachycardia who presents for follow-up after esophageal dilation and to discuss recent cardiac symptoms.  Gastroesophageal reflux disease symptoms - Improvement in symptoms following esophageal dilation and initiation of Nexium  20 mg once daily. - Continues to exercise caution with diet, especially during social events. - Experienced difficulty with eating during a recent cruise, with subsequent improvement as medication took effect. - Uses famotidine  occasionally, particularly with heavier meals or alcohol.  Cardiac arrhythmia symptoms - Recent episode of supraventricular tachycardia while walking, characterized by a 'surge up my chest' and rapid heart rate lasting approximately 30 minutes. - Managed episode with metoprolol . - History of supraventricular tachycardia, previously required adenosine for management. - Recent cardiology consultation after three years without follow-up. - Cardiac CT scheduled for further evaluation due to family history of heart block (mother affected).  Post-surgical bowel irregularities - History of sacral colpopexy and hernia repair in 2022. - Experiences thin and fragmented stools. - Discomfort to the left of the navel, attributed to scar tissue from prior surgeries. - Manages bowel habits with stool softeners and adequate hydration.    EGD 08/31/24 - No endoscopic esophageal abnormality to explain patient' s dysphagia. Dilated. - Congested and erythematous mucosa in the antrum.  Biopsied. - Multiple gastric polyps. Biopsied. - The examination was otherwise normal. - Gastroesophageal flap valve classified as Hill Grade II ( fold present, opens with respiration) . 1. Surgical [P], gastric antrum and body :       GASTRIC ANTRAL AND OXYNTIC MUCOSA WITH HYPEREMIA.       NEGATIVE FOR HELICOBACTER PYLORI.        2. Surgical [P], gastric polyp :       FUNDIC GLAND POLYPS (4).        Diagnosis Note : This case is amended to add NEGATIVE to the second line of the       diagnosis.   Outpatient Encounter Medications as of 10/18/2024  Medication Sig   Calcium  Carb-Cholecalciferol (CALCIUM  600 + D PO) Take 600 mg by mouth daily. Vit D 800 units   Cobalamin Combinations (B-12) 1000-400 MCG SUBL Place under the tongue once. Takes dropper once a day   esomeprazole  (NEXIUM ) 20 MG capsule Take 1 capsule (20 mg total) by mouth daily.   memantine  (NAMENDA ) 5 MG tablet Take 1 tablet (5 mg total) by mouth at bedtime.   metoprolol  succinate (TOPROL -XL) 25 MG 24 hr tablet Take 1 tablet by mouth once daily   metoprolol  tartrate (LOPRESSOR ) 25 MG tablet Take 1 tablet (25 mg total) by mouth daily as needed (you can take 25 mg up to 4 times in 24 hours for fast heart rate).   Multiple Vitamins-Minerals (CENTRUM MULTIGUMMIES PO) Take by mouth.   No facility-administered encounter medications on file as of 10/18/2024.    Allergies as of 10/18/2024 - Review Complete 10/18/2024  Allergen Reaction Noted   Hydrocodone-acetaminophen Other (See Comments) 08/02/2017   Pantoprazole  sodium Palpitations 09/11/2019    Past Medical History:  Diagnosis  Date   Abdominal pain 10/25/2022   Benign cyst of kidney    Bone spur of right foot    Chicken pox    Colitis 01/06/2021   DDD (degenerative disc disease), cervical    Diverticulitis 10/06/2020   treatment given in ER   GERD (gastroesophageal reflux disease)    Heart palpitations    History of nuclear stress test 03/2013   stress myoview ;  normal study; SVT (HR 200) - adenosine   Kidney stone 10/15/2021   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 03/2013 Echo: EF 55-60%, mild MVP, mild to mod MR.   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  Recommended continued compression for symptomatic improvement.  She can continue Advil as needed for pain.  Recommend no lifting more than 5 pounds for another 3 weeks and then gradual increase to tolerance.  She can move the wrist and the elbow to tolerance   Scoliosis    SVT (supraventricular tachycardia)    a. 03/2013 occurred during ex MV-->resolved with adenosine and carotid massage.    Past Surgical History:  Procedure Laterality Date   bladder prolapse  2022   COLONOSCOPY  01/12/2011   DIAGNOSTIC MAMMOGRAM  08/06/2014   digital   TRANSTHORACIC ECHOCARDIOGRAM  03/2013   EF 55-60%, mild conc hypertrophy; mild MVP, mild-mod MR   UMBILICAL HERNIA REPAIR  03/02/2021   with colprpex   VAGINAL HYSTERECTOMY  1989   partial; pt has both ovaries    Family History  Problem Relation Age of Onset   Heart attack Mother    Hypertension Mother    Stroke Mother    Breast cancer Mother    Osteoporosis Mother        cervical/femur fracture   Kidney disease Mother    Alzheimer's disease Mother    Atrial fibrillation Mother    Myasthenia gravis Father    Prostate cancer Father    Prostate cancer Brother    Early death Maternal Grandmother        died after 10/21/2024 baby   Heart disease Maternal Grandfather    Congestive Heart Failure Maternal Grandfather    Ovarian cancer Paternal Grandmother    Other Daughter        mitral valve damaged had to be repaired, cause unknown   Arthritis Maternal Aunt    Arthritis Maternal Uncle    Prostate cancer Maternal Uncle    Breast cancer Cousin    Colon cancer Neg Hx     Esophageal cancer Neg Hx    Rectal cancer Neg Hx    Colon polyps Neg Hx    Stomach cancer Neg Hx     Social History   Socioeconomic History   Marital status: Married    Spouse name: Marcey   Number of children: 5   Years of education: 16   Highest education level: Bachelor's degree (e.g., BA, AB, BS)  Occupational History   Occupation: retired [employer]  Tobacco Use   Smoking status: Never   Smokeless tobacco: Never  Vaping Use   Vaping status: Never Used  Substance and Sexual Activity   Alcohol use: No   Drug use: No   Sexual activity: Yes    Partners: Male    Birth control/protection: Surgical    Comment: married  Other Topics Concern  Not on file  Social History Narrative   Married to [place], they have 5 children.    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   Drinks caffeine, takes a daily vitamin   Wears seatbelt, smoke detector in the home, no firearms in the home.    Feels safe in her relationships.    One floor home   Right handed   One cup of coffee daily   13 grandchildren      Social Drivers of Health   Tobacco Use: Low Risk (10/18/2024)   Patient History    Smoking Tobacco Use: Never    Smokeless Tobacco Use: Never    Passive Exposure: Not on file  Financial Resource Strain: Low Risk (04/20/2024)   Overall Financial Resource Strain (CARDIA)    Difficulty of Paying Living Expenses: Not very hard  Food Insecurity: No Food Insecurity (10/03/2024)   Epic    Worried About Programme Researcher, Broadcasting/film/video in the Last Year: Never true    Ran Out of Food in the Last Year: Never true  Transportation Needs: No Transportation Needs (10/03/2024)   Epic    Lack of Transportation (Medical): No    Lack of Transportation (Non-Medical): No  Physical Activity: Sufficiently Active (10/03/2024)   Exercise Vital Sign    Days of Exercise per Week: 5 days    Minutes of Exercise per Session: 30 min  Stress: No Stress Concern Present (10/03/2024)   [employer]ational [employer] - Occupational Stress Questionnaire    Feeling of Stress: Not at all  Social Connections: Moderately Integrated (10/03/2024)   Social Connection and Isolation Panel    Frequency of Communication with Friends and Family: More than three times a week    Frequency of Social Gatherings with Friends and Family: More than three times a week    Attends Religious Services: More than 4 times per year    Active Member of [employer] or Organizations: No    Attends Banker Meetings: Never    Marital Status: Married  Catering Manager Violence: Not At Risk (10/03/2024)   Epic    Fear of Current or Ex-Partner: No    Emotionally Abused: No    Physically Abused: No    Sexually Abused: No  Depression (PHQ2-9): Low Risk (10/03/2024)   Depression (PHQ2-9)    PHQ-2 Score: 2  Alcohol Screen: Low Risk (04/20/2024)   Alcohol Screen    Last Alcohol Screening Score (AUDIT): 1  Housing: Unknown (10/03/2024)   Epic    Unable to Pay for Housing in the Last Year: No    Number of Times Moved in the Last Year: Not on file    Homeless in the Last Year: No  Utilities: Not At Risk (10/03/2024)   Epic    Threatened with loss of utilities: No  Health Literacy: Adequate Health Literacy (10/03/2024)   B1300 Health Literacy    Frequency of need for help with medical instructions: Never      Review of systems: All other review of systems negative except as mentioned in the HPI.   Physical Exam: Vitals:   10/18/24 0845  BP: 122/80  Pulse: 65  SpO2: 98%   Body mass index is 26.13 kg/m. Gen:      No acute distress HEENT:  sclera anicteric CV: s1s2 rrr, no murmur Lungs: B/l clear. Abd:      soft, non-tender; no palpable masses, no distension Ext:  No edema Neuro: alert and oriented x 3 Psych: normal mood and affect  Data Reviewed:  Reviewed labs, radiology imaging, old records and pertinent past GI work up   Assessment and Plan Assessment &  Plan Gastroesophageal reflux disease with hiatal hernia GERD symptoms are well-controlled with Nexium  20 mg daily. Recent dilation procedure performed. Hiatal hernia is small and not causing significant symptoms. Possible link between reflux and SVT episodes, though vagus nerve pressure typically decreases heart rate. No infection found in biopsies, mild inflammation likely due to acid. - Continue Nexium  20 mg once daily. - Use famotidine  (Pepcid ) at bedtime as needed, especially with heavier meals or alcohol consumption. - Consider long-term Nexium  use, with potential to reduce frequency if symptoms improve. - Monitor for any changes in symptoms or new triggers for SVT.  Abdominal adhesions with altered bowel habits post-hernia repair Altered bowel habits post-hernia repair, likely due to scar tissue and adhesions. Bowel movements are thin and sometimes broken up. No current bowel obstruction, but risk of adhesions causing blockages exists. - Use docusate (Colace) as needed for bowel softening. - Maintain regular bowel habits with adequate hydration. - Avoid lifting more than 20-30 pounds to prevent abdominal strain. - Monitor for signs of bowel obstruction, such as lack of bowel movements or gas, and seek medical attention if these occur.  Benign gastric polyps Biopsies showed benign gastric polyps, likely hypertrophied glands. No worrisome findings. - No specific treatment required for benign gastric polyps.        This visit required >40 minutes of patient care (this includes precharting, chart review, review of results, face-to-face time used for counseling as well as treatment plan and follow-up. The patient was provided an opportunity to ask questions and all were answered. The patient agreed with the plan and demonstrated an understanding of the instructions.  Jenny Hicks , MD    CC: Catherine Charlies LABOR, DO

## 2024-10-18 NOTE — Patient Instructions (Signed)
 We have sent the following medications to your pharmacy for you to pick up at your convenience: Pepcid, Nexium  and Colace.

## 2024-10-22 ENCOUNTER — Encounter: Payer: Self-pay | Admitting: Gastroenterology

## 2024-10-23 ENCOUNTER — Telehealth (HOSPITAL_COMMUNITY): Payer: Self-pay | Admitting: *Deleted

## 2024-10-23 NOTE — Telephone Encounter (Signed)
 Reaching out to patient to offer assistance regarding upcoming cardiac imaging study; pt verbalizes understanding of appt date/time, parking situation and where to check in, pre-test NPO status and medications ordered, and verified current allergies; name and call back number provided for further questions should they arise  Chantal Requena RN Navigator Cardiac Imaging [facility] [phone] office [phone] cell

## 2024-10-24 ENCOUNTER — Ambulatory Visit (HOSPITAL_COMMUNITY): Admission: RE | Admit: 2024-10-24 | Discharge: 2024-10-24 | Attending: Cardiology | Admitting: Cardiology

## 2024-10-24 DIAGNOSIS — R072 Precordial pain: Secondary | ICD-10-CM | POA: Insufficient documentation

## 2024-10-24 MED ORDER — NITROGLYCERIN 0.4 MG SL SUBL
0.8000 mg | SUBLINGUAL_TABLET | Freq: Once | SUBLINGUAL | Status: AC
  Administered 2024-10-24: 10:00:00 0.8 mg via SUBLINGUAL

## 2024-10-24 MED ORDER — IOHEXOL 350 MG/ML SOLN
100.0000 mL | Freq: Once | INTRAVENOUS | Status: AC | PRN
  Administered 2024-10-24: 10:00:00 100 mL via INTRAVENOUS

## 2024-10-25 ENCOUNTER — Encounter: Payer: Self-pay | Admitting: Cardiovascular Disease

## 2024-10-25 ENCOUNTER — Ambulatory Visit: Payer: Self-pay | Admitting: Cardiovascular Disease

## 2024-10-25 ENCOUNTER — Encounter: Payer: Self-pay | Admitting: Family Medicine

## 2024-10-25 DIAGNOSIS — R931 Abnormal findings on diagnostic imaging of heart and coronary circulation: Secondary | ICD-10-CM

## 2024-10-25 MED ORDER — ROSUVASTATIN CALCIUM 10 MG PO TABS: 10.0000 mg | ORAL_TABLET | Freq: Every day | ORAL | 3 refills | Status: DC

## 2024-10-31 ENCOUNTER — Encounter: Payer: PPO | Admitting: Psychology

## 2024-11-19 NOTE — Telephone Encounter (Signed)
 We can drop to 5 mg new prescription or she can take the 10 mg tabs every other day (rosuvastatin  is long acting and every other day works just as well), her preference

## 2024-11-20 MED ORDER — ROSUVASTATIN CALCIUM 5 MG PO TABS: 5.0000 mg | ORAL_TABLET | Freq: Every day | ORAL | 3 refills | Status: AC

## 2025-01-31 ENCOUNTER — Institutional Professional Consult (permissible substitution): Admitting: Psychology

## 2025-01-31 ENCOUNTER — Ambulatory Visit: Payer: Self-pay

## 2025-02-01 ENCOUNTER — Encounter: Admitting: Family Medicine

## 2025-02-07 ENCOUNTER — Encounter: Admitting: Psychology

## 2025-04-22 ENCOUNTER — Ambulatory Visit: Admitting: Physician Assistant
